# Patient Record
Sex: Male | Born: 1955 | Race: Black or African American | Hispanic: No | Marital: Single | Smoking: Current every day smoker
Health system: Southern US, Community
[De-identification: ages and names within clinical notes are randomized; demographics above are authoritative.]

## PROBLEM LIST (undated history)

## (undated) DIAGNOSIS — M1990 Unspecified osteoarthritis, unspecified site: Secondary | ICD-10-CM

## (undated) DIAGNOSIS — E785 Hyperlipidemia, unspecified: Secondary | ICD-10-CM

## (undated) DIAGNOSIS — Z8619 Personal history of other infectious and parasitic diseases: Secondary | ICD-10-CM

## (undated) DIAGNOSIS — T7840XA Allergy, unspecified, initial encounter: Secondary | ICD-10-CM

## (undated) HISTORY — DX: Unspecified osteoarthritis, unspecified site: M19.90

## (undated) HISTORY — PX: THYROID SURGERY: SHX805

## (undated) HISTORY — PX: NO PAST SURGERIES: SHX2092

## (undated) HISTORY — DX: Allergy, unspecified, initial encounter: T78.40XA

## (undated) HISTORY — DX: Personal history of other infectious and parasitic diseases: Z86.19

## (undated) HISTORY — PX: COLONOSCOPY: SHX174

## (undated) HISTORY — DX: Hyperlipidemia, unspecified: E78.5

---

## 2003-03-14 HISTORY — PX: COLONOSCOPY: SHX174

## 2008-12-15 ENCOUNTER — Ambulatory Visit: Payer: Self-pay | Admitting: Internal Medicine

## 2008-12-15 ENCOUNTER — Ambulatory Visit (HOSPITAL_BASED_OUTPATIENT_CLINIC_OR_DEPARTMENT_OTHER): Admission: RE | Admit: 2008-12-15 | Discharge: 2008-12-15 | Payer: Self-pay | Admitting: Internal Medicine

## 2008-12-15 ENCOUNTER — Ambulatory Visit: Payer: Self-pay | Admitting: Diagnostic Radiology

## 2008-12-15 ENCOUNTER — Telehealth: Payer: Self-pay | Admitting: Internal Medicine

## 2008-12-15 DIAGNOSIS — R221 Localized swelling, mass and lump, neck: Secondary | ICD-10-CM | POA: Insufficient documentation

## 2008-12-15 DIAGNOSIS — J309 Allergic rhinitis, unspecified: Secondary | ICD-10-CM | POA: Insufficient documentation

## 2008-12-15 DIAGNOSIS — A6000 Herpesviral infection of urogenital system, unspecified: Secondary | ICD-10-CM | POA: Insufficient documentation

## 2008-12-15 DIAGNOSIS — E785 Hyperlipidemia, unspecified: Secondary | ICD-10-CM | POA: Insufficient documentation

## 2008-12-15 LAB — CONVERTED CEMR LAB
ALT: 13 units/L (ref 0–53)
AST: 17 units/L (ref 0–37)
Albumin: 4.2 g/dL (ref 3.5–5.2)
Alkaline Phosphatase: 46 units/L (ref 39–117)
BUN: 16 mg/dL (ref 6–23)
Basophils Absolute: 0 10*3/uL (ref 0.0–0.1)
Basophils Relative: 1 % (ref 0–1)
CO2: 24 meq/L (ref 19–32)
Calcium: 9.4 mg/dL (ref 8.4–10.5)
Cholesterol: 230 mg/dL — ABNORMAL HIGH (ref 0–200)
Eosinophils Absolute: 0.1 10*3/uL (ref 0.0–0.7)
Free T4: 1.26 ng/dL (ref 0.80–1.80)
HCT: 41.4 % (ref 39.0–52.0)
Herpes Simplex Vrs I&II-IgM Ab (EIA): 1.01
Indirect Bilirubin: 0.5 mg/dL (ref 0.0–0.9)
MCHC: 32.4 g/dL (ref 30.0–36.0)
MCV: 88.3 fL (ref 78.0–100.0)
Monocytes Relative: 9 % (ref 3–12)
Neutro Abs: 2.1 10*3/uL (ref 1.7–7.7)
Neutrophils Relative %: 42 % — ABNORMAL LOW (ref 43–77)
RBC: 4.69 M/uL (ref 4.22–5.81)
RDW: 14.3 % (ref 11.5–15.5)
Sodium: 140 meq/L (ref 135–145)
WBC: 5.1 10*3/uL (ref 4.0–10.5)

## 2008-12-16 ENCOUNTER — Encounter: Payer: Self-pay | Admitting: Internal Medicine

## 2008-12-16 ENCOUNTER — Other Ambulatory Visit: Admission: RE | Admit: 2008-12-16 | Discharge: 2008-12-16 | Payer: Self-pay | Admitting: Otolaryngology

## 2008-12-17 ENCOUNTER — Telehealth: Payer: Self-pay | Admitting: Internal Medicine

## 2009-01-11 ENCOUNTER — Encounter (INDEPENDENT_AMBULATORY_CARE_PROVIDER_SITE_OTHER): Payer: Self-pay | Admitting: Otolaryngology

## 2009-01-11 ENCOUNTER — Ambulatory Visit (HOSPITAL_COMMUNITY): Admission: RE | Admit: 2009-01-11 | Discharge: 2009-01-12 | Payer: Self-pay | Admitting: Otolaryngology

## 2009-01-14 ENCOUNTER — Ambulatory Visit: Payer: Self-pay | Admitting: Internal Medicine

## 2009-01-14 DIAGNOSIS — E049 Nontoxic goiter, unspecified: Secondary | ICD-10-CM | POA: Insufficient documentation

## 2009-01-14 DIAGNOSIS — N529 Male erectile dysfunction, unspecified: Secondary | ICD-10-CM | POA: Insufficient documentation

## 2009-01-18 ENCOUNTER — Encounter: Payer: Self-pay | Admitting: Internal Medicine

## 2009-02-02 ENCOUNTER — Encounter: Payer: Self-pay | Admitting: Internal Medicine

## 2009-03-10 ENCOUNTER — Ambulatory Visit: Payer: Self-pay | Admitting: Internal Medicine

## 2009-03-10 LAB — CONVERTED CEMR LAB
ALT: 14 units/L (ref 0–53)
AST: 19 units/L (ref 0–37)
Albumin: 4.2 g/dL (ref 3.5–5.2)
BUN: 11 mg/dL (ref 6–23)
Bilirubin, Direct: 0.1 mg/dL (ref 0.0–0.3)
Calcium: 9.6 mg/dL (ref 8.4–10.5)
Cholesterol: 239 mg/dL — ABNORMAL HIGH (ref 0–200)
Free T4: 1.07 ng/dL (ref 0.80–1.80)
Glucose, Bld: 86 mg/dL (ref 70–99)
HDL: 48 mg/dL (ref >39)
Indirect Bilirubin: 0.3 mg/dL (ref 0.0–0.9)
Sodium: 141 meq/L (ref 135–145)
TSH: 2.05 microintl units/mL (ref 0.350–4.500)
Testosterone: 448.91 ng/dL (ref 350–890)
Total CHOL/HDL Ratio: 5
Total Protein: 7 g/dL (ref 6.0–8.3)
Triglycerides: 110 mg/dL (ref <150)

## 2009-03-17 ENCOUNTER — Ambulatory Visit: Payer: Self-pay | Admitting: Internal Medicine

## 2009-05-13 ENCOUNTER — Telehealth: Payer: Self-pay | Admitting: Internal Medicine

## 2009-05-21 ENCOUNTER — Encounter: Payer: Self-pay | Admitting: Internal Medicine

## 2009-05-28 ENCOUNTER — Telehealth: Payer: Self-pay | Admitting: Internal Medicine

## 2009-06-22 ENCOUNTER — Encounter: Payer: Self-pay | Admitting: Internal Medicine

## 2010-01-24 ENCOUNTER — Telehealth: Payer: Self-pay | Admitting: Internal Medicine

## 2010-04-12 NOTE — Progress Notes (Signed)
Summary: Call when papers are ready for pick up  Phone Note Call from Patient   Caller: Patient Summary of Call: Call patient when papers are ready [phone] ( Forms for Medicine Insurance) Initial call taken by: Michaelle Copas,  May 13, 2009 11:02 AM  Follow-up for Phone Call        call placed to patient at [phone], no answer, voice message left for patient to return call regarding patient assistance form. Patient signature is needed so the form coud be copied and faxed from our office Follow-up by: Glendell Docker CMA,  May 21, 2009 3:50 PM  Additional Follow-up for Phone Call Additional follow up Details #1::        Pt. called and stated he wll come by our office to sign forms. Additional Follow-up by: Michaelle Copas,  May 21, 2009 4:31 PM    Additional Follow-up for Phone Call Additional follow up Details #2::    patient has signed off on forms, forms have been mailed to [employer] connect care program, and copy sent to medical records to be scanned in patient chart  Follow-up by: Glendell Docker CMA,  May 24, 2009 2:08 PM  New/Updated Medications: LIPITOR 20 MG TABS (ATORVASTATIN CALCIUM) Take 1 tablet by mouth once a day LIPITOR 20 MG TABS (ATORVASTATIN CALCIUM) Take 1 tablet by mouth once a day Prescriptions: LIPITOR 20 MG TABS (ATORVASTATIN CALCIUM) Take 1 tablet by mouth once a day  #90 x 3   Entered by:   Glendell Docker CMA   Authorized by:   D. Thomos Lemons DO   Signed by:   Glendell Docker CMA on 05/20/2009   Method used:   Printed then faxed to ...       [facility]  [address] [phone][facility] (retail)       [address]       [place], [place]  [address]       Ph: [phone] or [phone]       Fax: [phone]   RxID:   [id number]

## 2010-04-12 NOTE — Medication Information (Signed)
Summary: Approval for Patient Assistance Program/[employer]  Approval for Patient Assistance Program/[employer]   Imported By: Lanelle Bal 07/12/2009 10:22:41  _____________________________________________________________________  External Attachment:    Type:   Image     Comment:   External Document

## 2010-04-12 NOTE — Medication Information (Signed)
Summary: Patient Assistance Form/[employer]  Patient Assistance Form/[employer]   Imported By: Lanelle Bal 05/31/2009 09:41:31  _____________________________________________________________________  External Attachment:    Type:   Image     Comment:   External Document

## 2010-04-12 NOTE — Progress Notes (Signed)
Summary: checking status   Phone Note Call from Patient   Caller: Patient Summary of Call: pt. calling to find out if we heard back from [employer]?? Call patient [phone] within the next 15 minutes so he knows if he can pick up his med or not. Thank you Initial call taken by: Michaelle Copas,  May 28, 2009 2:42 PM  Follow-up for Phone Call        call returned to patient at [phone] , no answer, detailed voice message left informing patient paperwork has been submitted awaiting approval/denial from [employer] Follow-up by: Glendell Docker CMA,  May 28, 2009 3:22 PM

## 2010-04-12 NOTE — Assessment & Plan Note (Signed)
Summary: 2 mointh follow up/mhf   Vital Signs:  Patient profile:   55 year old male Weight:      198.75 pounds BMI:     27.05 O2 Sat:      99 % on Room air Temp:     98.2 degrees F oral Pulse rate:   70 / minute Pulse rhythm:   irregular Resp:     18 per minute BP sitting:   100 / 70  (right arm) Cuff size:   large  Vitals Entered By: Glendell Docker CMA (March 17, 2009 8:58 AM)  O2 Flow:  Room air  Primary Care Provider:  D. Thomos Lemons DO  CC:  2 Month Follow up .  History of Present Illness: 2 Month Follow up   Hyperlipidemia Follow-Up      This is a 55 year old man who presents for Hyperlipidemia follow-up.  The patient denies muscle aches and GI upset.  The patient denies the following symptoms: chest pain/pressure.  Compliance with medications (by patient report) has been intermittent and poor.  Dietary compliance has been fair.    ED - improved  Preventive Screening-Counseling & Management  Alcohol-Tobacco     Smoking Status: never  Allergies (verified): No Known Drug Allergies  Past History:  Past Medical History: chicken pox- childhood Allergic rhinitis Hyperlipidemia    Family History: Family History of CAD Male 1st degree relative <50    Social History: Occupation: Work in Geneticist, molecular Single 1 daughter 24 1 son 89   Alcohol use-yes (6-12 beers per week) Never Smoked  Physical Exam  General:  alert, well-developed, and well-nourished.   Neck:  neck incision healed Lungs:  normal respiratory effort and normal breath sounds.   Heart:  normal rate, regular rhythm, and no gallop.   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 Horald Pollen.  benign follicular nodule.  currently euthyroid - monitor  Problem # 2:  HYPERLIPIDEMIA (ICD-272.4) LDL not at goal.  I urged compliance.    His updated medication list for this problem includes:    Lipitor 20 Mg Tabs  (Atorvastatin calcium) ..... One by mouth once daily  Labs Reviewed: SGOT: 19 (03/10/2009)   SGPT: 14 (03/10/2009)   HDL:48 (03/10/2009), 44 (12/15/2008)  LDL:169 (03/10/2009), 172 (12/15/2008)  Chol:239 (03/10/2009), 230 (12/15/2008)  Trig:110 (03/10/2009), 72 (12/15/2008)  Complete Medication List: 1)  Lipitor 20 Mg Tabs (Atorvastatin calcium) .... One by mouth once daily  Other Orders: Tdap => 22yrs IM ([address]) Admin 1st Vaccine ([address])  Patient Instructions: 1)  Please schedule a follow-up appointment in 1 year. 2)  BMP prior to visit, ICD-9: 401.9 3)  Hepatic Panel prior to visit, ICD-9: 272.4 4)  Lipid Panel prior to visit, ICD-9: 272.4 5)  TSH prior to visit, ICD-9: 272.4 6)  Please return for lab work one (1) week before your next appointment.  Prescriptions: LIPITOR 20 MG TABS (ATORVASTATIN CALCIUM) one by mouth once daily  #90 x 3   Entered and Authorized by:   D. Thomos Lemons DO   Signed by:   D. Thomos Lemons DO on 03/17/2009   Method used:   Electronically to        [facility]* (mail-order)             ,          Ph: [phone]       Fax:  [phone]   RxID:   [id number]   Current Allergies (reviewed today): No known allergies    Immunizations Administered:  Tetanus Vaccine:    Vaccine Type: Tdap    Site: left deltoid    Mfr: GlaxoSmithKline    Dose: 0.5 ml    Route: IM    Given by: Glendell Docker CMA    Exp. Date: 09/26/2010    Lot #: [id number]    VIS given: 01/29/07 version given March 17, 2009.

## 2010-04-12 NOTE — Progress Notes (Signed)
Summary: Lipitor refill  Phone Note Refill Request Message from:  Patient on January 24, 2010 1:32 PM  Refills Requested: Medication #1:  LIPITOR 20 MG TABS Take 1 tablet by mouth once a day.   Dosage confirmed as above?Dosage Confirmed   Brand Name Necessary? No   Supply Requested: 3 months   Last Refilled: 05/20/2009 He gets these from [employer].  He is out  The last time he got them he picked them up from our office    Method Requested: Electronic Next Appointment Scheduled: none Initial call taken by: Lannette Donath,  January 24, 2010 1:32 PM  Follow-up for Phone Call        Calll placed to [employer] at [phone] spoke with CSR [place], refill requested for Lipitor. Order number [id number], she states the order should arrive within 7-10 business days. Follow-up by: Glendell Docker CMA,  January 25, 2010 9:00 AM     Appended Document: Lipitor refill Pt called to check on status of order. Pt advised per note.

## 2010-05-06 ENCOUNTER — Telehealth: Payer: Self-pay | Admitting: Internal Medicine

## 2010-05-13 ENCOUNTER — Telehealth: Payer: Self-pay | Admitting: Internal Medicine

## 2010-05-18 ENCOUNTER — Encounter: Payer: Self-pay | Admitting: Internal Medicine

## 2010-05-19 NOTE — Progress Notes (Signed)
Summary: [employer]  pt assistent for Lipitor  Phone Note Call from Patient   Caller: Patient Details for Reason: Pt assistent program Summary of Call: Pt called- needs his Lipitor refill from [employer]  pt assistent program Initial call taken by: Darral Dash,  May 06, 2010 4:56 PM  Follow-up for Phone Call        call placed to  [employer] at [phone], Patient ID [id number],  Order for Lipitor placed. Order number [id number]. Recording stating rx should be available in 7-10 days.   call placed to patient at [phone], voice recording reached stating the telephone number is not in service.  Paitient is due for office visit. Follow-up by: Glendell Docker CMA,  May 09, 2010 4:34 PM    New/Updated Medications: LIPITOR 20 MG TABS (ATORVASTATIN CALCIUM) Take 1 tablet by mouth once a day Prescriptions: LIPITOR 20 MG TABS (ATORVASTATIN CALCIUM) Take 1 tablet by mouth once a day  #90 x 0   Entered by:   Glendell Docker CMA   Authorized by:   D. Thomos Lemons DO   Signed by:   Glendell Docker CMA on 05/09/2010   Method used:   Samples Given   RxID:   [id number]

## 2010-05-24 NOTE — Letter (Signed)
Summary: Generic Letter   at [facility]  [address]   [place], [place] [address]   Phone: [phone]  Fax: [phone]       05/18/2010     Tyrone Washington [address] [place], [place]  [address]     Dear Mr. GLASCOE,  There have been several attempts to contact you, with no success. This letter is to inform you of the patient assistance medication from [employer] for Lipitor is available at the office for pick up.   When pick in up this medication please update your contact information.         Sincerely,   Glendell Docker CMA Dr. Thomos Lemons

## 2010-05-24 NOTE — Progress Notes (Signed)
Summary: [employer]-Lipitor  Phone Note Outgoing Call   Call placed by: Glendell Docker CMA,  May 13, 2010 11:24 AM Call placed to: Patient Summary of Call: call placed to patient at [phone] to inform Lipitor from Patient assistance was available. Voice recording stating "the number that you have reached hs been temporarily disconnected. If you feel you have reached this recording in error please try your call again" Initial call taken by: Glendell Docker CMA,  May 13, 2010 11:24 AM  Follow-up for Phone Call        attempted to contact patient at [phone], recording reached stating the phone number is not in service, call placed to [phone], recording reached stating the number is not in service.   Follow-up by: Glendell Docker CMA,  May 18, 2010 2:59 PM  Additional Follow-up for Phone Call Additional follow up Details #1::        Letter mailed to patient Additional Follow-up by: Glendell Docker CMA,  May 18, 2010 3:08 PM

## 2010-05-27 ENCOUNTER — Encounter: Payer: Self-pay | Admitting: Internal Medicine

## 2010-05-27 ENCOUNTER — Encounter (INDEPENDENT_AMBULATORY_CARE_PROVIDER_SITE_OTHER): Payer: BC Managed Care – HMO | Admitting: Internal Medicine

## 2010-05-27 ENCOUNTER — Ambulatory Visit (HOSPITAL_BASED_OUTPATIENT_CLINIC_OR_DEPARTMENT_OTHER)
Admission: RE | Admit: 2010-05-27 | Discharge: 2010-05-27 | Disposition: A | Discharge status: Home / Self Care | Payer: BC Managed Care – HMO | Source: Ambulatory Visit | Attending: Internal Medicine | Admitting: Internal Medicine

## 2010-05-27 ENCOUNTER — Other Ambulatory Visit: Payer: Self-pay | Admitting: Internal Medicine

## 2010-05-27 DIAGNOSIS — R634 Abnormal weight loss: Secondary | ICD-10-CM

## 2010-05-27 DIAGNOSIS — Z0000 Encounter for general adult medical examination without abnormal findings: Secondary | ICD-10-CM

## 2010-05-27 DIAGNOSIS — R0602 Shortness of breath: Secondary | ICD-10-CM | POA: Insufficient documentation

## 2010-05-27 DIAGNOSIS — J309 Allergic rhinitis, unspecified: Secondary | ICD-10-CM

## 2010-05-29 ENCOUNTER — Encounter: Payer: Self-pay | Admitting: Internal Medicine

## 2010-05-30 LAB — CONVERTED CEMR LAB
ALT: 22 units/L (ref 0–53)
AST: 22 units/L (ref 0–37)
Albumin: 4.2 g/dL (ref 3.5–5.2)
BUN: 17 mg/dL (ref 6–23)
Basophils Absolute: 0.1 10*3/uL (ref 0.0–0.1)
CO2: 26 meq/L (ref 19–32)
Chloride: 104 meq/L (ref 96–112)
Creatinine, Ser: 1 mg/dL (ref 0.40–1.50)
Eosinophils Relative: 4 % (ref 0–5)
HCT: 41.4 % (ref 39.0–52.0)
HDL: 53 mg/dL (ref >39)
Hemoglobin: 13.6 g/dL (ref 13.0–17.0)
LDL Cholesterol: 107 mg/dL — ABNORMAL HIGH (ref 0–99)
MCHC: 32.9 g/dL (ref 30.0–36.0)
Monocytes Relative: 7 % (ref 3–12)
Neutro Abs: 1.6 10*3/uL — ABNORMAL LOW (ref 1.7–7.7)
Potassium: 5.1 meq/L (ref 3.5–5.3)
Total CHOL/HDL Ratio: 3.2
Total Protein: 7 g/dL (ref 6.0–8.3)
WBC: 3.8 10*3/uL — ABNORMAL LOW (ref 4.0–10.5)

## 2010-06-09 NOTE — Letter (Signed)
   [place] at [facility] [address] [place], [place]  [address]  [place] Phone: [phone]      May 29, 2010   [name] [address] [place], [place] [address]  RE:  LAB RESULTS  Dear  Mr. HAUSMANN,  The following is an interpretation of your most recent lab tests.  Please take note of any instructions provided or changes to medications that have resulted from your lab work.  ELECTROLYTES:  Good - no changes needed  KIDNEY FUNCTION TESTS:  Good - no changes needed  LIVER FUNCTION TESTS:  Good - no changes needed  LIPID PANEL:  Fair - review at your next visit Triglyceride: 61   Cholesterol: 172   LDL: 107   HDL: 53   Chol/HDL%:  3.2 Ratio  THYROID STUDIES:  Thyroid studies normal TSH: 2.811     CBC:  Stable - no changes needed       Sincerely Yours,    Dr. Thomos Lemons  Appended Document:  mailed

## 2010-06-14 NOTE — Assessment & Plan Note (Signed)
Summary: cpe/pt fasting/ss   Vital Signs:  Patient profile:   55 year old male Height:      72 inches Weight:      165 pounds BMI:     22.46 O2 Sat:      99 % on Room air Temp:     98.4 degrees F oral Pulse rate:   76 / minute Resp:     18 per minute BP sitting:   90 / 60  (right arm) Cuff size:   regular  Vitals Entered By: Glendell Docker CMA (May 27, 2010 9:43 AM)  O2 Flow:  Room air CC: CPX Is Patient Diabetic? No Pain Assessment Patient in pain? no      Comments fasting for labs,   Primary Care Provider:  Dondra Spry DO  CC:  CPX.  History of Present Illness: 55 y/o male for routine cpx lost 35 lbs over last 1 yr he denies appetite change he notes he is eating less job involves regular physical activity  feels healthy  denies chronic cough no fevers  started in Nov and Dec right ear - popping sensation taking sinus medication - taking otc pseudofed using salt water nose spray  Preventive Screening-Counseling & Management  Alcohol-Tobacco     Alcohol drinks/day: <1     Smoking Status: never  Caffeine-Diet-Exercise     Caffeine use/day: 4 cups coffe daily     Does Patient Exercise: no  Allergies (verified): No Known Drug Allergies  Past History:  Past Medical History: chicken pox- childhood Allergic rhinitis Hyperlipidemia     Family History: Family History of CAD Male 1st degree relative <50     Social History: Occupation: Work in Geneticist, 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  The patient denies anorexia, fever, chest pain, syncope, dyspnea on exertion, prolonged cough, headaches, abdominal pain, melena, hematochezia, severe indigestion/heartburn, depression, and testicular masses.    Physical Exam  General:  alert, well-developed, and well-nourished.   Head:   normocephalic and atraumatic.   Eyes:  vision grossly intact, pupils equal, pupils round, and pupils reactive to light.  bilateral arcus senilis Ears:  R ear normal and L ear normal.   Mouth:  Oral mucosa and oropharynx without lesions or exudates.   Neck:  supple, no masses, and no carotid bruits.   Lungs:  normal respiratory effort and normal breath sounds.   Heart:  normal rate, regular rhythm, and no gallop.   Abdomen:  soft, non-tender, no distention, no hepatomegaly, and no splenomegaly.   Genitalia:  uncircumcised, no scrotal masses, and no urethral discharge.   Neurologic:  cranial nerves II-XII intact and gait normal.     Impression & Recommendations:  Problem # 1:  PREVENTIVE HEALTH CARE (ICD-V70.0) Reviewed adult health maintenance protocols.  Colonoscopy: normal (09/04/2007) Td Booster: Tdap (03/17/2009)   Flu Vax: Fluvax Non-MCR (01/11/2009)   Chol: 239 (03/10/2009)   HDL: 48 (03/10/2009)   LDL: 169 (03/10/2009)   TG: 110 (03/10/2009) TSH: 2.050 (03/10/2009)     Problem # 2:  ALLERGIC RHINITIS (ICD-477.9) Assessment: Deteriorated  His updated medication list for this problem includes:    Fluticasone Propionate 50 Mcg/act Susp (Fluticasone propionate) .Marland Kitchen... 2 sprays each nostril qd  Problem # 3:  GENITAL HERPES (ICD-054.10) Assessment: Unchanged  Problem #  4:  WEIGHT LOSS (ICD-783.21)  Orders: CXR- 2view (CXR) T-Basic Metabolic Panel ([address]) T-Hepatic Function [phone]) T-CBC w/Diff [phone]) T-HIV Antibody  (Reflex) [phone]) T-TSH [phone]) T-T4, Free [phone])  Complete Medication List: 1)  Lipitor 20 Mg Tabs (Atorvastatin calcium) .... Take 1 tablet by mouth once a day 2)  Multivitamins Tabs (Multiple vitamin) .... Take 1 tablet by mouth once a day 3)  Fluticasone Propionate 50 Mcg/act Susp (Fluticasone propionate) .... 2 sprays each nostril qd 4)  Valacyclovir Hcl 500 Mg Tabs (Valacyclovir hcl) .... One tab by mouth once  daily  Other Orders: EKG w/ Interpretation ([address]) T-Lipid Profile ([address])  Patient Instructions: 1)  Please schedule a follow-up appointment in 2 months. Prescriptions: LIPITOR 20 MG TABS (ATORVASTATIN CALCIUM) Take 1 tablet by mouth once a day  #90 x 1   Entered and Authorized by:   D. Thomos Lemons DO   Signed by:   D. Thomos Lemons DO on 05/27/2010   Method used:   [facility]        [facility]  [address] [phone]* (retail)       [address]       [place], [place]  [address]       Ph: [phone] or [phone]       Fax: [phone]   RxID:   [id number] VALACYCLOVIR HCL 500 MG TABS (VALACYCLOVIR HCL) one tab by mouth once daily  #90 x 1   Entered and Authorized by:   D. Thomos Lemons DO   Signed by:   D. Thomos Lemons DO on 05/27/2010   Method used:   [facility]        [facility]  [address] [phone]* (retail)       [address]       [place], [place]  [address]       Ph: [phone] or [phone]       Fax: [phone]   RxID:   [id number] FLUTICASONE PROPIONATE 50 MCG/ACT SUSP (FLUTICASONE PROPIONATE) 2 sprays each nostril qd  #1 x 3   Entered and Authorized by:   D. Thomos Lemons DO   Signed by:   D. Thomos Lemons DO on 05/27/2010   Method used:   [facility]        [facility]  [address] [phone]* (retail)       [address]       [place], [place]  [address]       Ph: [phone] or [phone]       Fax: [phone]   RxID:   [id number]    Orders Added: 1)  EKG w/ Interpretation [[address]] 2)  CXR- 2view [CXR] 3)  T-Basic Metabolic Panel [80048-22910] 4)  T-Hepatic Function [80076-22960] 5)  T-CBC w/Diff [34193-79024] 6)  T-Lipid Profile [80061-22930] 7)  T-HIV Antibody  (Reflex) [[id number]] 8)  T-TSH [42683-41962] 9)  T-T4, Free [22979-89211] 10)  Est. Patient 40-64 years [[address]] 11)  Est. Patient Level III [[address]]    Current Allergies (reviewed today): No known  allergies

## 2010-06-16 LAB — CBC
HCT: 41 % (ref 39.0–52.0)
Platelets: 283 10*3/uL (ref 150–400)
RBC: 4.52 MIL/uL (ref 4.22–5.81)
RDW: 14.9 % (ref 11.5–15.5)
WBC: 4 10*3/uL (ref 4.0–10.5)

## 2010-06-16 LAB — BASIC METABOLIC PANEL
BUN: 11 mg/dL (ref 6–23)
CO2: 29 mEq/L (ref 19–32)
Chloride: 109 mEq/L (ref 96–112)
GFR calc Af Amer: >60 mL/min (ref >60)
Potassium: 4 mEq/L (ref 3.5–5.1)

## 2010-08-25 ENCOUNTER — Encounter: Payer: Self-pay | Admitting: Family

## 2010-08-26 ENCOUNTER — Ambulatory Visit: Payer: BC Managed Care – HMO | Admitting: Family

## 2010-08-29 ENCOUNTER — Encounter: Payer: Self-pay | Admitting: Family

## 2010-08-29 ENCOUNTER — Ambulatory Visit: Payer: BC Managed Care – HMO | Admitting: Family

## 2010-08-29 ENCOUNTER — Ambulatory Visit (HOSPITAL_BASED_OUTPATIENT_CLINIC_OR_DEPARTMENT_OTHER)
Admission: RE | Admit: 2010-08-29 | Discharge: 2010-08-29 | Disposition: A | Discharge status: Home / Self Care | Payer: BC Managed Care – HMO | Source: Ambulatory Visit | Attending: Family | Admitting: Family

## 2010-08-29 ENCOUNTER — Ambulatory Visit (INDEPENDENT_AMBULATORY_CARE_PROVIDER_SITE_OTHER): Payer: BC Managed Care – HMO | Admitting: Family

## 2010-08-29 VITALS — BP 100/78 | HR 84 | Temp 98.7°F | Resp 16 | Wt 175.0 lb

## 2010-08-29 DIAGNOSIS — R221 Localized swelling, mass and lump, neck: Secondary | ICD-10-CM

## 2010-08-29 DIAGNOSIS — R220 Localized swelling, mass and lump, head: Secondary | ICD-10-CM | POA: Insufficient documentation

## 2010-08-29 LAB — TSH: TSH: 1.585 u[IU]/mL (ref 0.350–4.500)

## 2010-08-29 NOTE — Patient Instructions (Signed)
Please complete your ultrasound and blood work on the first floor today.  Follow up in 1 month.

## 2010-08-29 NOTE — Assessment & Plan Note (Signed)
Will check TSH and order ultrasound of neck to further characterize. Based on neck results will consider referral back to Dr. Jenne Pane for further eval/biopsy.  Pt to f/u here in 1 month.

## 2010-08-29 NOTE — Progress Notes (Signed)
  Subjective:    Patient ID: Tyrone Washington, male    DOB: [birth year], 55 y.o.   MRN: [id number]  HPI  Tyrone Washington is a 55 yr old male who presents today with chief complaint of nodule on his neck.  Notes that he had a partial thyroidectomy 1 year ago (Dr. Jenne Pane).  Path showed microfollicular adenoma. He also had a consult with Dr. Talmage Nap at that time.    It was recommended that he have  A follow up TSH.  No synthroid was recommended at that time as he was euthyroid. 1 month  ago he developed a tender nodule on his left anterior neck.   Denies sore throat, ear pain, or fever.    Review of Systems See HPI  Past Medical History  Diagnosis Date  . History of chicken pox   . Allergy     allergic rhinitis  . Hyperlipidemia     History   Social History  . Marital Status: Single    Spouse Name: N/A    Number of Children: N/A  . Years of Education: N/A   Occupational History  . Not on file.   Social History Main Topics  . Smoking status: Never Smoker   . Smokeless tobacco: Not on file  . Alcohol Use: 3.6 - 7.2 oz/week    6-12 Cans of beer per week  . Drug Use: Not on file  . Sexually Active: Not on file   Other Topics Concern  . Not on file   Social History Narrative  . No narrative on file    No past surgical history on file.  Family History  Problem Relation Age of Onset  . Heart disease Other     No Known Allergies  Current Outpatient Prescriptions on File Prior to Visit  Medication Sig Dispense Refill  . atorvastatin (LIPITOR) 20 MG tablet Take 20 mg by mouth daily.        . fluticasone (FLONASE) 50 MCG/ACT nasal spray Place 2 sprays into both nostrils daily.        . Multiple Vitamin (MULTIVITAMIN) tablet Take 1 tablet by mouth daily.        . valACYclovir (VALTREX) 500 MG tablet Take 500 mg by mouth daily.          BP 100/78  Pulse 84  Temp(Src) 98.7 F (37.1 C) (Oral)  Resp 16  Wt 175 lb (79.379 kg)  SpO2 99%       Objective:   Physical Exam    Constitutional: He appears well-developed and well-nourished.  Neck:         Nodule noted at base of anterior neck (left) minimally mobile, hard, size of a large pea.  No other cervical LAD is noted.   Cardiovascular: Normal heart sounds.   Pulmonary/Chest: Effort normal and breath sounds normal.  Skin: Skin is warm and dry.          Assessment & Plan:

## 2010-08-30 ENCOUNTER — Telehealth: Payer: Self-pay | Admitting: Family

## 2010-08-30 DIAGNOSIS — R221 Localized swelling, mass and lump, neck: Secondary | ICD-10-CM

## 2010-08-30 NOTE — Telephone Encounter (Signed)
Left message for patient to return call.  When he calls back pls inform him that the ultrasound did not give [place] much more information about the nodule in his neck.  They recommended that we have a tissue biopsy.  I will refer back to ENT Dr. Jenne Pane for biopsy.

## 2010-08-30 NOTE — Telephone Encounter (Signed)
Pt returned call, explained [place] results and plans to refer to ENT for biopsy.

## 2010-09-19 ENCOUNTER — Telehealth: Payer: Self-pay | Admitting: Family

## 2010-09-19 NOTE — Telephone Encounter (Signed)
Reviewed FNA results from neck performed by [facility].  Noted thyroid follicular cells.  Follicular neoplasm cannot be excluded. Left message for patient to return my call on cell.

## 2010-09-20 NOTE — Telephone Encounter (Signed)
Called pt's cell- no answer- left additional message requesting call back.

## 2010-09-21 NOTE — Telephone Encounter (Signed)
Patient returned my call.  He tells me that Dr. Christell Constant reviewed the results of his biopsy and plans no further work up.   Nicki Guadalajara- could you please request records from Dr. Christell Constant- ENT? Thanks.

## 2010-09-21 NOTE — Telephone Encounter (Signed)
Spoke with [name] at [phone] and requested records.

## 2010-09-22 NOTE — Telephone Encounter (Signed)
Requested records received and forwarded to Provider for review.

## 2010-09-28 ENCOUNTER — Telehealth: Payer: Self-pay | Admitting: Family

## 2010-09-28 NOTE — Telephone Encounter (Signed)
Reviewed FNA results from ENT of neck biopsy- "follicular neoplasm cannot be excluded." Left message for Dr. Christell Constant to call me to review these results.

## 2010-10-03 NOTE — Telephone Encounter (Signed)
Dr. Christell Constant returned my call on 7/18.  He reviewed results of biopsy with me and told me that he feels these results indicated residual thyroid tissue- No further work up.

## 2011-07-07 ENCOUNTER — Encounter: Payer: Self-pay | Admitting: Family

## 2011-07-07 ENCOUNTER — Ambulatory Visit (INDEPENDENT_AMBULATORY_CARE_PROVIDER_SITE_OTHER): Payer: BC Managed Care – HMO | Admitting: Family

## 2011-07-07 VITALS — BP 112/86 | HR 71 | Temp 98.0°F | Resp 16 | Wt 165.1 lb

## 2011-07-07 DIAGNOSIS — A6000 Herpesviral infection of urogenital system, unspecified: Secondary | ICD-10-CM

## 2011-07-07 DIAGNOSIS — R221 Localized swelling, mass and lump, neck: Secondary | ICD-10-CM

## 2011-07-07 DIAGNOSIS — E785 Hyperlipidemia, unspecified: Secondary | ICD-10-CM

## 2011-07-07 DIAGNOSIS — R220 Localized swelling, mass and lump, head: Secondary | ICD-10-CM

## 2011-07-07 LAB — HEPATIC FUNCTION PANEL
AST: 19 U/L (ref 0–37)
Alkaline Phosphatase: 54 U/L (ref 39–117)
Bilirubin, Direct: 0.1 mg/dL (ref 0.0–0.3)
Indirect Bilirubin: 0.4 mg/dL (ref 0.0–0.9)
Total Bilirubin: 0.5 mg/dL (ref 0.3–1.2)

## 2011-07-07 LAB — LIPID PANEL
Cholesterol: 216 mg/dL — ABNORMAL HIGH (ref 0–200)
Total CHOL/HDL Ratio: 3.7 Ratio
VLDL: 8 mg/dL (ref 0–40)

## 2011-07-07 MED ORDER — ATORVASTATIN CALCIUM 20 MG PO TABS: 20.0000 mg | ORAL_TABLET | Freq: Every day | ORAL | Status: DC

## 2011-07-07 MED ORDER — VALACYCLOVIR HCL 500 MG PO TABS: 500.0000 mg | ORAL_TABLET | Freq: Every day | ORAL | Status: DC

## 2011-07-07 NOTE — Assessment & Plan Note (Signed)
Frequent outbreaks.  Resume valtrex.

## 2011-07-07 NOTE — Assessment & Plan Note (Signed)
Appears to be significantly larger than last visit.  Will refer back to ENT (Dr. Christell Constant).

## 2011-07-07 NOTE — Patient Instructions (Addendum)
Your will be contacted about your referral back to ENT.  Please complete your lab work prior to leaving.  Follow up in 1 month for a complete physical.  Come fasting to this appointment.

## 2011-07-07 NOTE — Assessment & Plan Note (Signed)
Resume lipitor, obtain flp/hepatic function.

## 2011-07-07 NOTE — Progress Notes (Signed)
  Subjective:    Patient ID: Tyrone Washington, male    DOB: [birth year], 56 y.o.   MRN: [id number]  HPI  [name] is a 56 yr old male who presents today to discuss an area of concern on his chest.  He notes an area that protrudes slightly in the middle of his chest. Mild associated tenderness.  First noticed it a week ago.  Neck mass- last year he had evaluation by ENT of the mass in the left lower anterior neck.  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   He underwent ultrasound of the neck last June which noted solid superficial nodule measuring 1.4 cm long X 0.7 cm AP X 0.8 cm.  Biopsy was recommended.  He was evaluated by ENT (Dr. Christell Constant) and ultrasound showed follicular cells which were felt to be residual thyroid tissue.    Hyperlipidemia- notes that he has not been taking lipitor. Wants to check cholesterol today. Plans to restart.  HSV- notes last few months he has had one outbreak a month.  Has not been taking suppressive valtrex.  Review of Systems See HPI  Past Medical History  Diagnosis Date  . History of chicken pox   . Allergy     allergic rhinitis  . Hyperlipidemia     History   Social History  . Marital Status: Single    Spouse Name: N/A    Number of Children: 2  . Years of Education: N/A   Occupational History  .     Social History Main Topics  . Smoking status: Never Smoker   . Smokeless tobacco: Never Used  . Alcohol Use: 3.6 - 7.2 oz/week    6-12 Cans of beer per week  . Drug Use: Not on file  . Sexually Active: Not on file   Other Topics Concern  . Not on file   Social History Narrative  . No narrative on file    No past surgical history on file.  Family History  Problem Relation Age of Onset  . Heart disease Other     No Known Allergies  Current Outpatient Prescriptions on File Prior to Visit  Medication Sig Dispense Refill  . fluticasone (FLONASE) 50 MCG/ACT nasal spray Place 2 sprays into  both nostrils daily.        . Multiple Vitamin (MULTIVITAMIN) tablet Take 1 tablet by mouth daily.        Marland Kitchen DISCONTD: atorvastatin (LIPITOR) 20 MG tablet Take 20 mg by mouth daily.          BP 112/86  Pulse 71  Temp(Src) 98 F (36.7 C) (Oral)  Resp 16  Wt 165 lb 1.9 oz (74.898 kg)  SpO2 97%       Objective:   Physical Exam  Constitutional: He appears well-developed and well-nourished. No distress.  Neck:       Firm mobile mass left lower anterior neck which is approx 1 inch in diameter  Cardiovascular: Normal rate and regular rhythm.   No murmur heard. Pulmonary/Chest: Effort normal and breath sounds normal. No respiratory distress. He has no wheezes. He has no rales. He exhibits no tenderness.  Musculoskeletal:       Area of concern corresponds with base of sternum.  No tenderness, swelling or palpable mass is noted.           Assessment & Plan:

## 2011-07-10 ENCOUNTER — Encounter: Payer: Self-pay | Admitting: Family

## 2011-08-09 ENCOUNTER — Encounter: Payer: BC Managed Care – HMO | Admitting: Family

## 2011-10-05 ENCOUNTER — Ambulatory Visit (HOSPITAL_BASED_OUTPATIENT_CLINIC_OR_DEPARTMENT_OTHER)
Admission: RE | Admit: 2011-10-05 | Discharge: 2011-10-05 | Disposition: A | Discharge status: Home / Self Care | Payer: BC Managed Care – HMO | Source: Ambulatory Visit | Attending: Family | Admitting: Family

## 2011-10-05 ENCOUNTER — Encounter: Payer: Self-pay | Admitting: Family

## 2011-10-05 ENCOUNTER — Ambulatory Visit (INDEPENDENT_AMBULATORY_CARE_PROVIDER_SITE_OTHER): Payer: BC Managed Care – HMO | Admitting: Family

## 2011-10-05 VITALS — BP 108/82 | HR 79 | Temp 98.1°F | Resp 14 | Wt 175.1 lb

## 2011-10-05 DIAGNOSIS — R42 Dizziness and giddiness: Secondary | ICD-10-CM

## 2011-10-05 MED ORDER — AMOXICILLIN-POT CLAVULANATE 875-125 MG PO TABS: 1.0000 | ORAL_TABLET | Freq: Two times a day (BID) | ORAL | Status: AC

## 2011-10-05 NOTE — Progress Notes (Signed)
  Subjective:    Patient ID: Tyrone Washington, male    DOB: 1955-10-13, 56 y.o.   MRN: [id number]  HPI  Reports several episodes of dizziness throughout the week.  Reports that he felt dizzy, like he was going to faint (around 9pm) , tried to get up felt off balance.  Room felt like it was spinning.  Reports that he has some sinus congestion.  He denies fever, nausea/vomitting.  Today he reports that he lost his balance again today.  No numbness, no weakness.  Denies ear pain.    Review of Systems See HPI  Past Medical History  Diagnosis Date  . History of chicken pox   . Allergy     allergic rhinitis  . Hyperlipidemia     History   Social History  . Marital Status: Single    Spouse Name: N/A    Number of Children: 2  . Years of Education: N/A   Occupational History  .     Social History Main Topics  . Smoking status: Never Smoker   . Smokeless tobacco: Never Used  . Alcohol Use: 3.6 - 7.2 oz/week    6-12 Cans of beer per week  . Drug Use: Not on file  . Sexually Active: Not on file   Other Topics Concern  . Not on file   Social History Narrative  . No narrative on file    No past surgical history on file.  Family History  Problem Relation Age of Onset  . Heart disease Other     No Known Allergies  Current Outpatient Prescriptions on File Prior to Visit  Medication Sig Dispense Refill  . atorvastatin (LIPITOR) 20 MG tablet Take 1 tablet (20 mg total) by mouth daily.  30 tablet  5  . fluticasone (FLONASE) 50 MCG/ACT nasal spray Place 2 sprays into both nostrils daily.        . Multiple Vitamin (MULTIVITAMIN) tablet Take 1 tablet by mouth daily.        . valACYclovir (VALTREX) 500 MG tablet Take 1 tablet (500 mg total) by mouth daily.  30 tablet  5    BP 108/82  Pulse 79  Temp 98.1 F (36.7 C) (Oral)  Resp 14  Wt 175 lb 1.9 oz (79.434 kg)  SpO2 99%       Objective:   Physical Exam  Constitutional: He is oriented to person, place, and time. He  appears well-developed and well-nourished. No distress.  HENT:  Head: Normocephalic and atraumatic.  Right Ear: Tympanic membrane and ear canal normal.  Left Ear: Tympanic membrane and ear canal normal.  Mouth/Throat: No oropharyngeal exudate, posterior oropharyngeal edema or posterior oropharyngeal erythema.  Eyes: EOM are normal.  Cardiovascular: Normal rate and regular rhythm.   No murmur heard. Pulmonary/Chest: Effort normal and breath sounds normal. No respiratory distress. He has no wheezes. He has no rales. He exhibits no tenderness.  Neurological: He is alert and oriented to person, place, and time. He has normal strength.  Psychiatric: He has a normal mood and affect. His behavior is normal. Judgment and thought content normal.          Assessment & Plan:

## 2011-10-05 NOTE — Patient Instructions (Addendum)
Please complete your CT on the first floor- we will contact you with your results. Call if symptoms worsen or if symptoms do not improve.

## 2011-10-06 ENCOUNTER — Telehealth: Payer: Self-pay | Admitting: *Deleted

## 2011-10-06 ENCOUNTER — Encounter: Payer: Self-pay | Admitting: *Deleted

## 2011-10-06 MED ORDER — MECLIZINE HCL 25 MG PO TABS: 25.0000 mg | ORAL_TABLET | Freq: Three times a day (TID) | ORAL | Status: AC | PRN

## 2011-10-06 NOTE — Telephone Encounter (Signed)
Per verbal from Provider, ok to take Meclizine 25mg  every 8 hours as needed for dizziness, #30 x no refills. Notified pt and sent Rx to [facility]. Advised pt that medication may cause drowsiness and to use caution while taking it.  Pt voices concern about possibility of developing diabetes. Has read articles that lipitor can lead to diabetes. Pt concerned that his current symptoms could be related to diabetes. Pt denies persistent increased thirst / urination or blurred vision. States he has had episodes of increased thirst and urination in the past. Pt does report relief of headache after using flonase and claritin. Pt will continue antibiotic. He reports that he has not had any bad episodes of dizziness today. Advised pt to be evalutated in the ER over the weekend if symptoms become severe. Pt is aware that we will contact him Monday re: diabetes concerns.  Please advise.

## 2011-10-06 NOTE — Telephone Encounter (Signed)
Notified pt of negative CT. He reports that he continues to have dizziness with standing and wants to know what he should do?  Please advise.

## 2011-10-06 NOTE — Telephone Encounter (Signed)
Encounter opened in error

## 2011-10-08 NOTE — Telephone Encounter (Signed)
Please let patient know that his sugar was normal in March.  If he would like he can return to lab for a fasting glucose- diagnosis dizziness.

## 2011-10-09 NOTE — Telephone Encounter (Signed)
Attempted to reach pt and left detailed message to call and let me know if he wants to proceed with fasting glucose blood test.

## 2011-10-11 DIAGNOSIS — R42 Dizziness and giddiness: Secondary | ICD-10-CM | POA: Insufficient documentation

## 2011-10-11 NOTE — Assessment & Plan Note (Signed)
Plan empiric rx with augmentin for sinusitis.

## 2012-10-25 ENCOUNTER — Other Ambulatory Visit: Payer: Self-pay | Admitting: Family

## 2012-10-25 NOTE — Telephone Encounter (Signed)
Pt last seen 10/06/11 and has no appts on file. Pt requesting refills of liipitor and valtrex.  Please advise.

## 2012-10-25 NOTE — Telephone Encounter (Signed)
Patient last seen for regular OV 04.26.13, due back in 74-month for CPE; seen 07.23.2013 for Acute, pt needs OV as Rx[s] have not been filled since 04.26.13/SLS

## 2012-10-25 NOTE — Telephone Encounter (Signed)
OK to give 2 week supply of lipitor and valtrex.  Needs ov prior to additional refills.

## 2012-10-26 ENCOUNTER — Other Ambulatory Visit: Payer: Self-pay | Admitting: Family

## 2012-10-28 NOTE — Telephone Encounter (Signed)
2 week supply sent per documentation below. Please call pt to arrange fasting cpe.   Sandford Craze, NP at 10/25/2012 4:40 PM   Status: Signed            OK to give 2 week supply of lipitor and valtrex. Needs ov prior to additional refills.

## 2012-10-30 NOTE — Telephone Encounter (Signed)
No answer, no voicemail.  Will try again later.

## 2012-10-31 NOTE — Telephone Encounter (Signed)
No answer, no voicemail.  Will try again later.

## 2012-11-01 NOTE — Telephone Encounter (Signed)
Letter mailed to pt to arrange fasting physical before further medications can be prescribed.

## 2012-11-01 NOTE — Telephone Encounter (Signed)
No answer, no voicemail.

## 2013-04-22 ENCOUNTER — Telehealth: Payer: Self-pay | Admitting: Family

## 2013-04-22 NOTE — Telephone Encounter (Signed)
Refill- lipitor  Refill- valtrex

## 2013-04-22 NOTE — Telephone Encounter (Signed)
Pt has not been seen since 2013 and a letter was mailed to pt in 10/2012 advising him we would need to see him before we could refill any medications. Denial sent to pharmacy.

## 2013-05-05 ENCOUNTER — Telehealth: Payer: Self-pay | Admitting: Family

## 2013-05-05 ENCOUNTER — Encounter: Payer: Self-pay | Admitting: Family

## 2013-05-05 ENCOUNTER — Ambulatory Visit (INDEPENDENT_AMBULATORY_CARE_PROVIDER_SITE_OTHER): Payer: BC Managed Care – HMO | Admitting: Family

## 2013-05-05 VITALS — BP 110/80 | HR 82 | Temp 98.3°F | Resp 16 | Ht 71.5 in | Wt 191.0 lb

## 2013-05-05 DIAGNOSIS — J309 Allergic rhinitis, unspecified: Secondary | ICD-10-CM

## 2013-05-05 DIAGNOSIS — E785 Hyperlipidemia, unspecified: Secondary | ICD-10-CM

## 2013-05-05 DIAGNOSIS — A6000 Herpesviral infection of urogenital system, unspecified: Secondary | ICD-10-CM

## 2013-05-05 MED ORDER — VALACYCLOVIR HCL 500 MG PO TABS: ORAL_TABLET | ORAL | Status: DC

## 2013-05-05 MED ORDER — ATORVASTATIN CALCIUM 20 MG PO TABS: ORAL_TABLET | ORAL | Status: DC

## 2013-05-05 MED ORDER — FLUTICASONE PROPIONATE 50 MCG/ACT NA SUSP: 2.0000 | Freq: Every day | NASAL | Status: DC

## 2013-05-05 NOTE — Progress Notes (Signed)
Pre visit review using our clinic review tool, if applicable. No additional management support is needed unless otherwise documented below in the visit note. 

## 2013-05-05 NOTE — Assessment & Plan Note (Signed)
Obtain hepatic function and lipid panel today. Refill Lipitor. Follow up in six months.

## 2013-05-05 NOTE — Progress Notes (Signed)
Subjective:    Patient ID: Tyrone Washington, male    DOB: 29-Aug-1955, 58 y.o.   MRN: [id number]  Hyperlipidemia Associated symptoms include shortness of breath. Pertinent negatives include no chest pain or myalgias.   Tyrone Washington is a 58 year old male who presents today for refill request.  Herpes) Patient has not taken his Valtrex for eight months and reports last outbreak was last week and states he's had sporadic breakouts during the past 8 months.   Hyperlipidemia) Patient has not had Lipitor since August 2014. Patient reports he does not exercise and states that he eats a moderate amount of fruits and vegetables with little fast food intake.  Allergies) Patient requesting a refill of his Flonase due to congestion and rhinorrhea from allergies. Denies fever, sore throat, ear pain.   Review of Systems  Constitutional: Negative for fever.  HENT: Positive for congestion and rhinorrhea.   Eyes: Positive for itching.  Respiratory: Positive for shortness of breath.        Patient reports allergies.  Cardiovascular: Negative for chest pain.  Gastrointestinal: Negative for abdominal pain.  Genitourinary: Negative for discharge, difficulty urinating and penile pain.  Musculoskeletal: Negative for myalgias.  Neurological: Negative for dizziness and headaches.   Past Medical History  Diagnosis Date  . History of chicken pox   . Allergy     allergic rhinitis  . Hyperlipidemia     History   Social History  . Marital Status: Single    Spouse Name: N/A    Number of Children: 2  . Years of Education: N/A   Occupational History  .     Social History Main Topics  . Smoking status: Never Smoker   . Smokeless tobacco: Never Used  . Alcohol Use: 3.6 - 7.2 oz/week    6-12 Cans of beer per week  . Drug Use: Not on file  . Sexual Activity: Not on file   Other Topics Concern  . Not on file   Social History Narrative  . No narrative on file    No past surgical history on  file.  Family History  Problem Relation Age of Onset  . Heart disease Other     No Known Allergies  Current Outpatient Prescriptions on File Prior to Visit  Medication Sig Dispense Refill  . Multiple Vitamin (MULTIVITAMIN) tablet Take 1 tablet by mouth daily.         No current facility-administered medications on file prior to visit.    BP 110/80  Pulse 82  Temp(Src) 98.3 F (36.8 C) (Oral)  Resp 16  Ht 5' 11.5" (1.816 m)  Wt 191 lb (86.637 kg)  BMI 26.27 kg/m2  SpO2 99%       Objective:   Physical Exam  Vitals reviewed. Constitutional: He is oriented to person, place, and time. He appears well-nourished.  HENT:  Head: Normocephalic.  Right Ear: External ear normal.  Left Ear: External ear normal.  Mouth/Throat: Oropharynx is clear and moist.  Cardiovascular: Normal rate, regular rhythm, S1 normal, S2 normal and normal heart sounds.   Pulmonary/Chest: Effort normal and breath sounds normal. No respiratory distress. He has no wheezes.  Lymphadenopathy:    He has no cervical adenopathy.  Neurological: He is alert and oriented to person, place, and time.  Skin: Skin is warm and dry.  Psychiatric: He has a normal mood and affect.          Assessment & Plan:  I have personally seen and examined  patient and agree with Alma Friendly NP student's assessment and plan.

## 2013-05-05 NOTE — Patient Instructions (Addendum)
Please return fasting for lab work tomorrow- lab opens at [facility]. Follow up in 3 months for a fasting physical.

## 2013-05-05 NOTE — Telephone Encounter (Signed)
Could you please check with [facility] re: cash pay price for Hepatic function and FLP and notify pt?

## 2013-05-05 NOTE — Assessment & Plan Note (Signed)
Frequent outbreaks. Refill Valtrex for prophylaxis.

## 2013-05-05 NOTE — Assessment & Plan Note (Signed)
Stable. Refill RX for Flonase.

## 2013-05-06 NOTE — Telephone Encounter (Signed)
Per phlebotomist, hepatic function panel is $44 and lipid panel is $55. Pt may contact billing once he received statement to arrange payment plan.  Notified pt and he states will proceed with testing around Thursday. Order is already in the system.

## 2013-05-09 LAB — LIPID PANEL
CHOL/HDL RATIO: 4.2 ratio
CHOLESTEROL: 160 mg/dL (ref 0–200)
HDL: 38 mg/dL — AB (ref >39)
LDL Cholesterol: 110 mg/dL — ABNORMAL HIGH (ref 0–99)
Triglycerides: 58 mg/dL (ref <150)
VLDL: 12 mg/dL (ref 0–40)

## 2013-05-09 LAB — HEPATIC FUNCTION PANEL
ALK PHOS: 51 U/L (ref 39–117)
ALT: 16 U/L (ref 0–53)
AST: 20 U/L (ref 0–37)
Albumin: 3.7 g/dL (ref 3.5–5.2)
BILIRUBIN INDIRECT: 0.2 mg/dL (ref 0.2–1.2)
BILIRUBIN TOTAL: 0.3 mg/dL (ref 0.2–1.2)
Bilirubin, Direct: 0.1 mg/dL (ref 0.0–0.3)
Total Protein: 6.4 g/dL (ref 6.0–8.3)

## 2013-05-12 ENCOUNTER — Encounter: Payer: Self-pay | Admitting: Family

## 2013-10-30 ENCOUNTER — Ambulatory Visit: Payer: Self-pay | Admitting: Physician Assistant

## 2013-10-30 DIAGNOSIS — Z0289 Encounter for other administrative examinations: Secondary | ICD-10-CM

## 2013-10-31 ENCOUNTER — Ambulatory Visit (INDEPENDENT_AMBULATORY_CARE_PROVIDER_SITE_OTHER): Payer: Self-pay | Admitting: Physician Assistant

## 2013-10-31 ENCOUNTER — Encounter: Payer: Self-pay | Admitting: Physician Assistant

## 2013-10-31 VITALS — BP 100/78 | HR 67 | Temp 98.8°F | Resp 16 | Ht 71.5 in | Wt 179.0 lb

## 2013-10-31 DIAGNOSIS — G43009 Migraine without aura, not intractable, without status migrainosus: Secondary | ICD-10-CM

## 2013-10-31 NOTE — Progress Notes (Signed)
Pre visit review using our clinic review tool, if applicable. No additional management support is needed unless otherwise documented below in the visit note/SLS  

## 2013-10-31 NOTE — Patient Instructions (Signed)
Increase your fluid intake.  Caffeine is ok but don't over do it.  Eat a well-balanced diet.  If symptoms recur, take an Excedrin Migraine.  Go see your eye doctor for a vision screen. Call or return to clinic if symptoms recur.  Migraine Headache A migraine headache is an intense, throbbing pain on one or both sides of your head. A migraine can last for 30 minutes to several hours. CAUSES  The exact cause of a migraine headache is not always known. However, a migraine may be caused when nerves in the brain become irritated and release chemicals that cause inflammation. This causes pain. Certain things may also trigger migraines, such as:  Alcohol.  Smoking.  Stress.  Menstruation.  Aged cheeses.  Foods or drinks that contain nitrates, glutamate, aspartame, or tyramine.  Lack of sleep.  Chocolate.  Caffeine.  Hunger.  Physical exertion.  Fatigue.  Medicines used to treat chest pain (nitroglycerine), birth control pills, estrogen, and some blood pressure medicines. SIGNS AND SYMPTOMS  Pain on one or both sides of your head.  Pulsating or throbbing pain.  Severe pain that prevents daily activities.  Pain that is aggravated by any physical activity.  Nausea, vomiting, or both.  Dizziness.  Pain with exposure to bright lights, loud noises, or activity.  General sensitivity to bright lights, loud noises, or smells. Before you get a migraine, you may get warning signs that a migraine is coming (aura). An aura may include:  Seeing flashing lights.  Seeing bright spots, halos, or zigzag lines.  Having tunnel vision or blurred vision.  Having feelings of numbness or tingling.  Having trouble talking.  Having muscle weakness. DIAGNOSIS  A migraine headache is often diagnosed based on:  Symptoms.  Physical exam.  A CT scan or MRI of your head. These imaging tests cannot diagnose migraines, but they can help rule out other causes of  headaches. TREATMENT Medicines may be given for pain and nausea. Medicines can also be given to help prevent recurrent migraines.  HOME CARE INSTRUCTIONS  Only take over-the-counter or prescription medicines for pain or discomfort as directed by your health care provider. The use of long-term narcotics is not recommended.  Lie down in a dark, quiet room when you have a migraine.  Keep a journal to find out what may trigger your migraine headaches. For example, write down:  What you eat and drink.  How much sleep you get.  Any change to your diet or medicines.  Limit alcohol consumption.  Quit smoking if you smoke.  Get 7-9 hours of sleep, or as recommended by your health care provider.  Limit stress.  Keep lights dim if bright lights bother you and make your migraines worse. SEEK IMMEDIATE MEDICAL CARE IF:   Your migraine becomes severe.  You have a fever.  You have a stiff neck.  You have vision loss.  You have muscular weakness or loss of muscle control.  You start losing your balance or have trouble walking.  You feel faint or pass out.  You have severe symptoms that are different from your first symptoms. MAKE SURE YOU:   Understand these instructions.  Will watch your condition.  Will get help right away if you are not doing well or get worse. Document Released: 02/27/2005 Document Revised: 07/14/2013 Document Reviewed: 11/04/2012 [facility] Patient Information 2015 [place], [place]. This information is not intended to replace advice given to you by your health care provider. Make sure you discuss any questions you have  with your health care provider.  

## 2013-11-10 DIAGNOSIS — G43909 Migraine, unspecified, not intractable, without status migrainosus: Secondary | ICD-10-CM | POA: Insufficient documentation

## 2013-11-10 NOTE — Assessment & Plan Note (Signed)
Excedrin migraine for moderate to severe headache.  Stay well hydrated.  Well-balanced diet.  Ok to have some caffeine. Recommend Optometrist as migraine may be stemming from some decreased vision in conjunction with prolonged use of computer.

## 2013-11-10 NOTE — Progress Notes (Signed)
Patient presents to clinic today c/o intermittent flashing lights in from of R eye over the past couple of days.  Endorses mild headache with nausea.  Denies photophobia or phonophobia.  Denies emesis.  Has been staring at a computer screen a lot more often with new position at work.  Denies lightheadedness, dizziness, loss of vision.  Denies recent trauma or injury.  Tries to eat a well-balanced diet.  Past Medical History  Diagnosis Date  . History of chicken pox   . Allergy     allergic rhinitis  . Hyperlipidemia     Current Outpatient Prescriptions on File Prior to Visit  Medication Sig Dispense Refill  . atorvastatin (LIPITOR) 20 MG tablet TAKE 1 TABLET EVERY DAY  30 tablet  5  . fluticasone (FLONASE) 50 MCG/ACT nasal spray Place 2 sprays into both nostrils daily.  16 g  5  . Multiple Vitamin (MULTIVITAMIN) tablet Take 1 tablet by mouth daily.        . valACYclovir (VALTREX) 500 MG tablet TAKE 1 TABLET EVERY DAY  30 tablet  5   No current facility-administered medications on file prior to visit.    No Known Allergies  Family History  Problem Relation Age of Onset  . Heart disease Other     History   Social History  . Marital Status: Single    Spouse Name: N/A    Number of Children: 2  . Years of Education: N/A   Occupational History  .     Social History Main Topics  . Smoking status: Never Smoker   . Smokeless tobacco: Never Used  . Alcohol Use: 3.6 - 7.2 oz/week    6-12 Cans of beer per week  . Drug Use: None  . Sexual Activity: None   Other Topics Concern  . None   Social History Narrative  . None   Review of Systems - See HPI.  All other ROS are negative.  BP 100/78  Pulse 67  Temp(Src) 98.8 F (37.1 C) (Oral)  Resp 16  Ht 5' 11.5" (1.816 m)  Wt 179 lb (81.194 kg)  BMI 24.62 kg/m2  SpO2 99%  Physical Exam  Vitals reviewed. Constitutional: He is oriented to person, place, and time and well-developed, well-nourished, and in no distress.  HENT:   Head: Normocephalic and atraumatic.  Right Ear: External ear normal.  Left Ear: External ear normal.  Nose: Nose normal.  Mouth/Throat: Oropharynx is clear and moist. No oropharyngeal exudate.  Eyes: Conjunctivae, EOM and lids are normal. Pupils are equal, round, and reactive to light. No scleral icterus.  Cardiovascular: Normal rate, regular rhythm, normal heart sounds and intact distal pulses.   Pulmonary/Chest: Effort normal and breath sounds normal. No respiratory distress. He has no wheezes. He has no rales. He exhibits no tenderness.  Neurological: He is alert and oriented to person, place, and time. No cranial nerve deficit. GCS score is 15.  Skin: Skin is warm and dry. No rash noted.  Psychiatric: Affect normal.   Assessment/Plan: Migraine headache Excedrin migraine for moderate to severe headache.  Stay well hydrated.  Well-balanced diet.  Ok to have some caffeine. Recommend Optometrist as migraine may be stemming from some decreased vision in conjunction with prolonged use of computer.

## 2014-07-28 ENCOUNTER — Other Ambulatory Visit: Payer: Self-pay | Admitting: Family

## 2014-07-29 NOTE — Telephone Encounter (Signed)
Called pt and pt stated he will call back to schedule a appointment due to work conflict and financial concerns.

## 2014-07-29 NOTE — Telephone Encounter (Signed)
Pt last seen by PCP 04/2013 and advised 6 month follow up.  Pt is past due. 2 week supply given. Please call pt to arrange follow up within that time.

## 2015-07-09 ENCOUNTER — Telehealth: Payer: Self-pay | Admitting: Family

## 2015-07-09 NOTE — Telephone Encounter (Signed)
Needs office visit prior to refill.

## 2015-07-09 NOTE — Telephone Encounter (Signed)
Notified pt and he requests earlier appt. Appt r/s for 07/16/15 at 1:30pm.

## 2015-07-09 NOTE — Telephone Encounter (Signed)
Melissa-- pt has scheduled and appt with you on 08/10/15 but he has not been seen in the office since 2015.  Please advise request?

## 2015-07-09 NOTE — Telephone Encounter (Signed)
Pt is requesting a refill on his valACYclovir Rx.    Pharmacy: [place]: [phone]

## 2015-07-16 ENCOUNTER — Telehealth: Payer: Self-pay | Admitting: Family

## 2015-07-16 ENCOUNTER — Encounter: Payer: Self-pay | Admitting: Family

## 2015-07-16 ENCOUNTER — Ambulatory Visit (INDEPENDENT_AMBULATORY_CARE_PROVIDER_SITE_OTHER): Payer: Self-pay | Admitting: Family

## 2015-07-16 VITALS — BP 102/62 | HR 94 | Temp 98.3°F | Resp 16 | Ht 71.5 in | Wt 174.6 lb

## 2015-07-16 DIAGNOSIS — R9431 Abnormal electrocardiogram [ECG] [EKG]: Secondary | ICD-10-CM | POA: Insufficient documentation

## 2015-07-16 DIAGNOSIS — Z0000 Encounter for general adult medical examination without abnormal findings: Secondary | ICD-10-CM

## 2015-07-16 DIAGNOSIS — R221 Localized swelling, mass and lump, neck: Secondary | ICD-10-CM

## 2015-07-16 LAB — HEPATIC FUNCTION PANEL
ALBUMIN: 4 g/dL (ref 3.5–5.2)
ALT: 12 U/L (ref 0–53)
AST: 14 U/L (ref 0–37)
Alkaline Phosphatase: 46 U/L (ref 39–117)
Bilirubin, Direct: 0.1 mg/dL (ref 0.0–0.3)
Total Bilirubin: 0.4 mg/dL (ref 0.2–1.2)
Total Protein: 6.9 g/dL (ref 6.0–8.3)

## 2015-07-16 LAB — CBC WITH DIFFERENTIAL/PLATELET
Basophils Absolute: 0 10*3/uL (ref 0.0–0.1)
Basophils Relative: 1.1 % (ref 0.0–3.0)
EOS PCT: 5.5 % — AB (ref 0.0–5.0)
Eosinophils Absolute: 0.2 10*3/uL (ref 0.0–0.7)
HCT: 39.8 % (ref 39.0–52.0)
HEMOGLOBIN: 13.5 g/dL (ref 13.0–17.0)
Lymphocytes Relative: 42.2 % (ref 12.0–46.0)
Lymphs Abs: 1.5 10*3/uL (ref 0.7–4.0)
MCHC: 33.9 g/dL (ref 30.0–36.0)
MCV: 88.3 fl (ref 78.0–100.0)
MONOS PCT: 10.5 % (ref 3.0–12.0)
Monocytes Absolute: 0.4 10*3/uL (ref 0.1–1.0)
Neutro Abs: 1.4 10*3/uL (ref 1.4–7.7)
Neutrophils Relative %: 40.7 % — ABNORMAL LOW (ref 43.0–77.0)
Platelets: 268 10*3/uL (ref 150.0–400.0)
RBC: 4.5 Mil/uL (ref 4.22–5.81)
RDW: 14.6 % (ref 11.5–15.5)
WBC: 3.5 10*3/uL — AB (ref 4.0–10.5)

## 2015-07-16 LAB — BASIC METABOLIC PANEL
BUN: 15 mg/dL (ref 6–23)
CO2: 30 mEq/L (ref 19–32)
CREATININE: 1 mg/dL (ref 0.40–1.50)
Calcium: 9.6 mg/dL (ref 8.4–10.5)
Chloride: 105 mEq/L (ref 96–112)
GFR: 97.51 mL/min (ref >60.00)
GLUCOSE: 64 mg/dL — AB (ref 70–99)
POTASSIUM: 3.7 meq/L (ref 3.5–5.1)
Sodium: 141 mEq/L (ref 135–145)

## 2015-07-16 LAB — LIPID PANEL
CHOLESTEROL: 262 mg/dL — AB (ref 0–200)
HDL: 45.9 mg/dL (ref >39.00)
LDL CALC: 196 mg/dL — AB (ref 0–99)
NonHDL: 216.01
Total CHOL/HDL Ratio: 6
Triglycerides: 98 mg/dL (ref 0.0–149.0)
VLDL: 19.6 mg/dL (ref 0.0–40.0)

## 2015-07-16 LAB — URINALYSIS, ROUTINE W REFLEX MICROSCOPIC
BILIRUBIN URINE: NEGATIVE
Leukocytes, UA: NEGATIVE
NITRITE: NEGATIVE
PH: 5.5 (ref 5.0–8.0)
SPECIFIC GRAVITY, URINE: 1.025 (ref 1.000–1.030)
Total Protein, Urine: NEGATIVE
Urine Glucose: NEGATIVE
Urobilinogen, UA: 1 (ref 0.0–1.0)

## 2015-07-16 LAB — TSH: TSH: 1.32 u[IU]/mL (ref 0.35–4.50)

## 2015-07-16 MED ORDER — VALACYCLOVIR HCL 500 MG PO TABS: 500.0000 mg | ORAL_TABLET | Freq: Every day | ORAL | Status: DC

## 2015-07-16 MED FILL — VALACYCLOVIR HCL 500 MG TAB: 500 | 30 days supply | Qty: 30 | Fill #0

## 2015-07-16 NOTE — Progress Notes (Signed)
Pre visit review using our clinic review tool, if applicable. No additional management support is needed unless otherwise documented below in the visit note. 

## 2015-07-16 NOTE — Assessment & Plan Note (Signed)
Continue healthy diet, exercise.  Colonoscopy is up to date.  Counseled patient on smoking cessation. He wishes to proceed with standard lab work today.

## 2015-07-16 NOTE — Telephone Encounter (Signed)
Per verbal from PCP, EKG is abnormal and pt needs referral to cardiology. Discussed with pt that EKG indicates possible prior damage to heart and he needs referral to cardiology. Pt states he wants to hold off on cardiology referral at this time due to lack of insurance. Advised pt to call us when he is able to proceed with referral and he voices understanding.

## 2015-07-16 NOTE — Patient Instructions (Addendum)
Please schedule dental and vision. Complete lab work prior to leaving. Call the [facility] office to inquire about applying for patient assistance. Also, please look into purchasing an Set designer. Let me know if you obtain insurance and you would like a referral back to ENT doctor for removal of your neck mass. [phone] Follow up in 3 months.

## 2015-07-16 NOTE — Assessment & Plan Note (Addendum)
I am very concerned about this neck mass being a possible underlying malignancy given the massive growth.  Informed pt that it is possible that the neck mass is cancer. I advised him that I would like for him to have the mass removed. He is uninsured and tells me that he cannot afford the surgery at this time.  I advised him as follows:   Call the [place] office to inquire about applying for patient assistance. Also, please look into purchasing an Set designer. Let me know if you obtain insurance and you would like a referral back to ENT doctor for removal of your neck mass.

## 2015-07-16 NOTE — Progress Notes (Signed)
Unfortunately you are stuck. You can force him to take the referral. He could go to [facility] and discuss his case and he might get some assistance or be placed on a reduced fee schedule then we might be able to talk him into the referral after that.

## 2015-07-16 NOTE — Progress Notes (Signed)
Subjective:    Patient ID: Tyrone Washington, male    DOB: [birth year], 60 y.o.   MRN: [id number]  HPI  Patient presents today for complete physical.  Immunizations: tetanus 2011 Diet: healthy Exercise: walks some Colonoscopy: 2009 Dental: due Vision:  2002 Smokes cigars every other day.  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  When I saw patient back in 2012 mass was noted to be present in the left anterior neck with a hard/pea sized minimally mobile mass.  He underwent US at that time and was referred back to ENT for further evaluation/biopsy.  [place] at that time noted:  1. Left thyroidectomy. 2. Interval left anterior neck circumscribed peripherally vascular ovoid solid superficial nodule measuring 1.4 X 0.7 X 0.8 cm is nonspecific. Differential diagnosis includes interval slight reactive and possible neoplastic solitary adenopathy or superficial abscess. Recommend tissue diagnosis for further evaluation.  He was evaluated by Dr. Hassell Done ENT who recommended excision of the mass.  The patient tells me that he chose to "back out" of the procedure. He states that his decision to "back out" was mainly financial since he tells me that he had to come up with $800 for the surgery.  Today he tells me that the mass has enlarged. Denies dysphagia or pain.  He is unfortunately without insurance at this time, but he is looking for full time work with benefits.   Review of Systems  Constitutional: Negative for unexpected weight change.  HENT: Negative for rhinorrhea.   Respiratory: Negative for cough.   Cardiovascular: Negative for leg swelling.  Gastrointestinal: Negative for diarrhea, constipation and blood in stool.  Genitourinary: Negative for dysuria and frequency.  Musculoskeletal:       Occasional right knee pain  Skin: Positive for rash.       back  Neurological: Negative for headaches.  Hematological: Does not bruise/bleed  easily.  Psychiatric/Behavioral:       Denies depression/anxiety   Past Medical History  Diagnosis Date  . History of chicken pox   . Allergy     allergic rhinitis  . Hyperlipidemia      Social History   Social History  . Marital Status: Single    Spouse Name: N/A  . Number of Children: 2  . Years of Education: N/A   Occupational History  .     Social History Main Topics  . Smoking status: Current Every Day Smoker  . Smokeless tobacco: Never Used     Comment: Smokes 1 cigar every other day  . Alcohol Use: 3.6 - 7.2 oz/week    6-12 Cans of beer per week  . Drug Use: Not on file  . Sexual Activity: Not on file   Other Topics Concern  . 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     Denies hobbies   No pets.     History reviewed. No pertinent past surgical history.  Family History  Problem Relation Age of Onset  . Heart disease Father     patient is unsure of history  . CVA Father     No Known Allergies  Current Outpatient Prescriptions on File Prior  to Visit  Medication Sig Dispense Refill  . Multiple Vitamin (MULTIVITAMIN) tablet Take 1 tablet by mouth daily.      . valACYclovir (VALTREX) 500 MG tablet TAKE 1 TABLET BY MOUTH DAILY 15 tablet 0  . atorvastatin (LIPITOR) 20 MG tablet TAKE 1 TABLET EVERY DAY (Patient not taking: Reported on 07/16/2015) 30 tablet 5   No current facility-administered medications on file prior to visit.    BP 102/62 mmHg  Pulse 94  Temp(Src) 98.3 F (36.8 C) (Oral)  Resp 16  Ht 5' 11.5" (1.816 m)  Wt 174 lb 9.6 oz (79.198 kg)  BMI 24.01 kg/m2  SpO2 100%       Objective:   Physical Exam  Constitutional: He is oriented to person, place, and time. He appears well-developed  and well-nourished. No distress.  HENT:  Head: Normocephalic and atraumatic.  Right Ear: Tympanic membrane and ear canal normal.  Left Ear: Tympanic membrane and ear canal normal.  Mouth/Throat: No oropharyngeal exudate, posterior oropharyngeal edema or posterior oropharyngeal erythema.  Neck:    Firm, non-tender multilobular large baseball sized mass noted left anterior neck.    Cardiovascular: Normal rate and regular rhythm.   No murmur heard. Pulmonary/Chest: Effort normal and breath sounds normal. No respiratory distress. He has no wheezes. He has no rales.  Musculoskeletal: He exhibits no edema.  Lymphadenopathy:    He has no cervical adenopathy.  Neurological: He is alert and oriented to person, place, and time.  Skin: Skin is warm and dry.  Psychiatric: He has a normal mood and affect. His behavior is normal. Thought content normal.          Assessment & Plan:  EKG is performed today. Notes anterior TWI in lead I and II, also in V4-6.  I am unable to view the prior EKG tracing which was performed in our [facility] system in 2012.  I would like to refer him to cardiology for further work up.  Pt declines referral to cardiology at this time due to cost.

## 2015-07-17 LAB — PSA: PSA: 0.7 ng/mL (ref <=4.00)

## 2015-07-17 LAB — HEPATITIS C ANTIBODY: HCV Ab: NEGATIVE

## 2015-07-18 ENCOUNTER — Telehealth: Payer: Self-pay | Admitting: Family

## 2015-07-18 MED ORDER — SIMVASTATIN 40 MG PO TABS: 40.0000 mg | ORAL_TABLET | Freq: Every day | ORAL | Status: DC

## 2015-07-18 NOTE — Telephone Encounter (Signed)
Please let pt know that his cholesterol is very high. I would like pt to start simvastatin, repeat lipids in 6 weeks, dx hyperlipidemia. Please remind pt to call [place] business office to look into patient assistance.  Let me know when he is ready for me to refer him back to the ENT for removal of his neck mass.

## 2015-07-19 ENCOUNTER — Other Ambulatory Visit: Payer: Self-pay | Admitting: Family

## 2015-07-19 DIAGNOSIS — E785 Hyperlipidemia, unspecified: Secondary | ICD-10-CM

## 2015-07-19 MED FILL — SIMVASTATIN 40 MG TABLET: 40 | 30 days supply | Qty: 30 | Fill #0

## 2015-07-19 NOTE — Telephone Encounter (Signed)
Notified pt and he voices understanding. Scheduled lab appt for 08/30/15 at 7:15am. Order entered. He will let us know when he is ready to proceed with ENT referral.

## 2015-08-10 ENCOUNTER — Encounter: Payer: Self-pay | Admitting: Family

## 2015-08-20 ENCOUNTER — Telehealth: Payer: Self-pay | Admitting: Family

## 2015-08-20 NOTE — Telephone Encounter (Signed)
Could you please contact [facility] in [place] at [phone] and ask them if they can see an uninsured patient from [employer]?  If so, would you please give the patient the contact info to schedule an appointment along with my most recent office note and [facility] records/path report.  He will need to bring these records with him to his appointment. It is my hope that they could make a reduced self pay referral for him for his neck mass.

## 2015-08-20 NOTE — Telephone Encounter (Signed)
Attempted to reach facility and left message on voicemail to return my call.

## 2015-08-23 NOTE — Telephone Encounter (Signed)
Spoke with below facility and confirmed that they will see pt from [employer] as long as pt qualifies financially. States pt needs to come to their office to complete application and if qualifies they will schedule him an appointment. Below records printed and placed at front desk for pt to pick up. Office address and contact # emailed to pt per pt's request ([email or web address]).  [address], [place] [address]. Hours 8am to 5pm closed daily from 12-1pm.

## 2015-08-30 ENCOUNTER — Other Ambulatory Visit: Payer: Self-pay

## 2015-09-06 ENCOUNTER — Other Ambulatory Visit: Payer: Self-pay

## 2015-10-11 ENCOUNTER — Encounter: Payer: Self-pay | Admitting: Family

## 2015-10-11 ENCOUNTER — Telehealth: Payer: Self-pay | Admitting: Family

## 2015-10-11 ENCOUNTER — Ambulatory Visit (INDEPENDENT_AMBULATORY_CARE_PROVIDER_SITE_OTHER): Payer: BLUE CROSS/BLUE SHIELD | Admitting: Family

## 2015-10-11 VITALS — BP 110/80 | HR 70 | Temp 97.6°F | Resp 16 | Ht 72.0 in | Wt 172.8 lb

## 2015-10-11 DIAGNOSIS — E785 Hyperlipidemia, unspecified: Secondary | ICD-10-CM

## 2015-10-11 DIAGNOSIS — R221 Localized swelling, mass and lump, neck: Secondary | ICD-10-CM

## 2015-10-11 DIAGNOSIS — R9431 Abnormal electrocardiogram [ECG] [EKG]: Secondary | ICD-10-CM

## 2015-10-11 LAB — LIPID PANEL
CHOL/HDL RATIO: 4
Cholesterol: 217 mg/dL — ABNORMAL HIGH (ref 0–200)
HDL: 53.2 mg/dL (ref >39.00)
LDL CALC: 151 mg/dL — AB (ref 0–99)
NONHDL: 163.91
Triglycerides: 65 mg/dL (ref 0.0–149.0)
VLDL: 13 mg/dL (ref 0.0–40.0)

## 2015-10-11 MED ORDER — SIMVASTATIN 40 MG PO TABS: 40.0000 mg | ORAL_TABLET | Freq: Every day | ORAL | 5 refills | Status: DC

## 2015-10-11 MED ORDER — VALACYCLOVIR HCL 500 MG PO TABS: 500.0000 mg | ORAL_TABLET | Freq: Every day | ORAL | 5 refills | Status: DC

## 2015-10-11 MED ORDER — SIMVASTATIN 40 MG PO TABS: 80.0000 mg | ORAL_TABLET | Freq: Every day | ORAL | 5 refills | Status: DC

## 2015-10-11 MED FILL — VALACYCLOVIR HCL 500 MG TAB: 500 | 30 days supply | Qty: 30 | Fill #0

## 2015-10-11 MED FILL — SIMVASTATIN 40 MG TABLET: 40 | 30 days supply | Qty: 30 | Fill #0

## 2015-10-11 NOTE — Progress Notes (Signed)
Subjective:    Patient ID: Tyrone Washington, male    DOB: [birth year], 60 y.o.   MRN: [id number]  HPI  Mr. Broxterman is a 60 yr old male who presents today for follow up of his left neck mass.  We tried to get patient in to a self pay program in [place] last visit, however he did not follow through with this referral. Fortunately, since his last visit he has obtained health insurance.  The patient reports feeling well but notes that he thinks that the mass may be enlarging. He denies neck pain/tenderness.  Abnormal EKG- was referred to cardiology last visit.  He did not follow through with the referral.  Hyperlipidemia- reports compliance with statin.   Review of Systems See HPI  Past Medical History:  Diagnosis Date  . Allergy    allergic rhinitis  . History of chicken pox   . Hyperlipidemia      Social History   Social History  . Marital status: Single    Spouse name: N/A  . Number of children: 2  . Years of education: N/A   Occupational History  .  Sheets   Social History Main Topics  . Smoking status: Current Every Day Smoker  . Smokeless tobacco: Never Used     Comment: Smokes 1 cigar every other day  . Alcohol use 0.6 oz/week    1 Cans of beer per week  . Drug use: Unknown  . Sexual activity: Not on file   Other Topics Concern  . 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     Denies hobbies   No pets.     No past surgical history on file.  Family History  Problem Relation Age of Onset  . Heart disease Father     patient is unsure of history  . CVA Father     No Known Allergies  Current Outpatient Prescriptions on File Prior to Visit  Medication Sig Dispense Refill  . Multiple Vitamin (MULTIVITAMIN)  tablet Take 1 tablet by mouth daily.      . simvastatin (ZOCOR) 40 MG tablet Take 1 tablet (40 mg total) by mouth at bedtime. 30 tablet 3  . valACYclovir (VALTREX) 500 MG tablet Take 1 tablet (500 mg total) by mouth daily. 30 tablet 5   No current facility-administered medications on file prior to visit.     BP 110/80   Pulse 70   Temp 97.6 F (36.4 C) (Oral)   Resp 16   Ht 6' (1.829 m)   Wt 172 lb 12.8 oz (78.4 kg)   SpO2 98% Comment: room air  BMI 23.44 kg/m       Objective:   Physical Exam  Constitutional: He is oriented to person, place, and time. He appears well-developed and well-nourished. No distress.  HENT:  Head: Normocephalic and atraumatic.  Right Ear: Tympanic membrane and ear canal normal.  Left Ear: Tympanic membrane and ear canal normal.  Mouth/Throat: No oropharyngeal exudate, posterior oropharyngeal edema or posterior oropharyngeal erythema.  Neck:    Firm, non-tender multilobular large baseball sized mass noted left anterior neck.  (seems slightly larger than last visit)  Cardiovascular: Normal rate and  regular rhythm.   No murmur heard. Pulmonary/Chest: Effort normal and breath sounds normal. No respiratory distress. He has no wheezes. He has no rales.  Lymphadenopathy:    He has no cervical adenopathy.  Neurological: He is alert and oriented to person, place, and time.  Skin: Skin is warm and dry.  Psychiatric: He has a normal mood and affect. His behavior is normal. Thought content normal.          Assessment & Plan:

## 2015-10-11 NOTE — Assessment & Plan Note (Signed)
Enlarging, he is now agreeable to return to ENT for further evaluation and excision and I stressed the importance of following through with this.

## 2015-10-11 NOTE — Progress Notes (Signed)
Pre visit review using our clinic review tool, if applicable. No additional management support is needed unless otherwise documented below in the visit note. 

## 2015-10-11 NOTE — Patient Instructions (Addendum)
Please complete lab work prior to leaving (cholesterol).  You will be contacted about your referral to ENT (Dr. Laurance Flatten for your neck mass) and Cardiology (for evaluation of your abnormal EKG). If you have not heard back about these appointments in 1 week, please let me know.

## 2015-10-11 NOTE — Telephone Encounter (Signed)
Cholesterol is improving but still mildly elevated. I sent rx earlier for simvastatin 40mg  daily. I would like him to increase to 80mg  (2 tabs) once daily. Repeat FLP in 6 weeks. Dx hyperlipidemia.

## 2015-10-11 NOTE — Assessment & Plan Note (Signed)
Will re-initiate cardiology referral.

## 2015-10-11 NOTE — Assessment & Plan Note (Signed)
Obtain follow up lipid panel.

## 2015-10-12 NOTE — Telephone Encounter (Signed)
Left message for pt to return my call.

## 2015-10-13 NOTE — Addendum Note (Signed)
Addended by: Kelle Darting A on: 10/13/2015 04:32 PM   Modules accepted: Orders

## 2015-10-13 NOTE — Telephone Encounter (Signed)
Pt notified and scheduled lab  appt for 11/24/15. Future order entered.

## 2015-10-19 ENCOUNTER — Ambulatory Visit: Payer: Self-pay | Admitting: Family

## 2015-11-03 MED FILL — SIMVASTATIN 40 MG TABLET: 40 | 30 days supply | Qty: 30 | Fill #1

## 2015-11-03 MED FILL — VALACYCLOVIR HCL 500 MG TAB: 500 | 30 days supply | Qty: 30 | Fill #1

## 2015-11-04 ENCOUNTER — Ambulatory Visit: Payer: BLUE CROSS/BLUE SHIELD | Admitting: Interventional Cardiology

## 2015-11-22 ENCOUNTER — Telehealth: Payer: Self-pay | Admitting: Family

## 2015-11-22 NOTE — Telephone Encounter (Signed)
Pt would like a call back to be advised on his cholesterol Rx.    CB: [phone]

## 2015-11-22 NOTE — Telephone Encounter (Signed)
Spoke with pt. He states he was unsure if he should continue taking simvastatin 2 tablets daily. Advised him per 10/11/15 phone note that he should continue taking 2 tablets daily and he is due to repeat his lipid panel on 11/24/15. Pt is aware.

## 2015-11-24 ENCOUNTER — Other Ambulatory Visit: Payer: BLUE CROSS/BLUE SHIELD

## 2015-11-25 ENCOUNTER — Other Ambulatory Visit (INDEPENDENT_AMBULATORY_CARE_PROVIDER_SITE_OTHER): Payer: BLUE CROSS/BLUE SHIELD

## 2015-11-25 DIAGNOSIS — E785 Hyperlipidemia, unspecified: Secondary | ICD-10-CM | POA: Diagnosis not present

## 2015-11-25 LAB — LIPID PANEL
CHOL/HDL RATIO: 3
CHOLESTEROL: 149 mg/dL (ref 0–200)
HDL: 50.1 mg/dL (ref >39.00)
LDL Cholesterol: 89 mg/dL (ref 0–99)
NonHDL: 99.16
TRIGLYCERIDES: 52 mg/dL (ref 0.0–149.0)
VLDL: 10.4 mg/dL (ref 0.0–40.0)

## 2015-11-26 ENCOUNTER — Encounter: Payer: Self-pay | Admitting: Family

## 2015-12-03 MED FILL — SIMVASTATIN 40 MG TABLET: 40 | 30 days supply | Qty: 30 | Fill #2

## 2015-12-13 ENCOUNTER — Encounter (INDEPENDENT_AMBULATORY_CARE_PROVIDER_SITE_OTHER): Payer: Self-pay

## 2015-12-13 ENCOUNTER — Ambulatory Visit (INDEPENDENT_AMBULATORY_CARE_PROVIDER_SITE_OTHER): Payer: BLUE CROSS/BLUE SHIELD | Admitting: Interventional Cardiology

## 2015-12-13 ENCOUNTER — Encounter: Payer: Self-pay | Admitting: Interventional Cardiology

## 2015-12-13 VITALS — BP 112/70 | HR 81 | Ht 72.0 in | Wt 176.0 lb

## 2015-12-13 DIAGNOSIS — Z01810 Encounter for preprocedural cardiovascular examination: Secondary | ICD-10-CM

## 2015-12-13 DIAGNOSIS — R9431 Abnormal electrocardiogram [ECG] [EKG]: Secondary | ICD-10-CM

## 2015-12-13 DIAGNOSIS — E785 Hyperlipidemia, unspecified: Secondary | ICD-10-CM

## 2015-12-13 NOTE — Patient Instructions (Signed)

## 2015-12-13 NOTE — Progress Notes (Signed)
Cardiology Office Note   Date:  12/13/2015   ID:  Tyrone Washington, DOB 1955/07/04, MRN [id number]  PCP:  Nance Pear., NP    No chief complaint on file. abnormal ECG   Wt Readings from Last 3 Encounters:  12/13/15 176 lb (79.8 kg)  10/11/15 172 lb 12.8 oz (78.4 kg)  07/16/15 174 lb 9.6 oz (79.2 kg)       History of Present Illness: Tyrone Washington is a 60 y.o. male  Who was found to have an abnormal ECG by PMD on routine physical.  He used to walk more.  He has not been walking as much over the past 4 months.  He is now working long hours and this takes up his free time.  He walks at work and lifts things. No CP or SHOB with these activities.    He had a stress test between 2004 and 2008 in [place].  That was normal.    He smokes cigars.  He is not trying to quit.  No known family h/o CAD.  Father had a stroke.   If he overeats, he gets some GERD sx.  No exertional CP even lifting heavy items at work- up to 75 lbs.     Past Medical History:  Diagnosis Date  . Allergy    allergic rhinitis  . History of chicken pox   . Hyperlipidemia     Past Surgical History:  Procedure Laterality Date  . NO PAST SURGERIES       Current Outpatient Prescriptions  Medication Sig Dispense Refill  . Multiple Vitamin (MULTIVITAMIN) tablet Take 1 tablet by mouth daily.      . simvastatin (ZOCOR) 40 MG tablet Take 2 tablets (80 mg total) by mouth at bedtime. 30 tablet 5  . valACYclovir (VALTREX) 500 MG tablet Take 1 tablet (500 mg total) by mouth daily. 30 tablet 5   No current facility-administered medications for this visit.     Allergies:   Review of patient's allergies indicates no known allergies.    Social History:  The patient  reports that he has been smoking.  He has never used smokeless tobacco. He reports that he drinks about 0.6 oz of alcohol per week .   Family History:  The patient's family history includes CVA in his father; Heart disease in his  father.    ROS:  Please see the history of present illness.   Otherwise, review of systems are positive for neck mass- awaiting surgery for goiter.   All other systems are reviewed and negative.    PHYSICAL EXAM: VS:  BP 112/70   Pulse 81   Ht 6' (1.829 m)   Wt 176 lb (79.8 kg)   BMI 23.87 kg/m  , BMI Body mass index is 23.87 kg/m. GEN: Well nourished, well developed, in no acute distress  HEENT: normal  Neck: no JVD, carotid bruits, large neck  mass Cardiac: RRR; no murmurs, rubs, or gallops,no edema  Respiratory:  clear to auscultation bilaterally, normal work of breathing GI: soft, nontender, nondistended, + BS MS: no deformity or atrophy  Skin: warm and dry, no rash Neuro:  Strength and sensation are intact Psych: euthymic mood, full affect   EKG:   The ekg ordered today demonstrates NSR, nonspecific lateral ST-T wave changes.  Less pronounced compared to ECG in 20   Recent Labs: 07/16/2015: ALT 12; BUN 15; Creatinine, Ser 1.00; Hemoglobin 13.5; Platelets 268.0; Potassium 3.7; Sodium 141; TSH 1.32  Lipid Panel    Component Value Date/Time   CHOL 149 11/25/2015 0723   TRIG 52.0 11/25/2015 0723   HDL 50.10 11/25/2015 0723   CHOLHDL 3 11/25/2015 0723   VLDL 10.4 11/25/2015 0723   LDLCALC 89 11/25/2015 0723     Other studies Reviewed: Additional studies/ records that were reviewed today with results demonstrating: Compared today's ECG to ECG from May 2017..   ASSESSMENT AND PLAN:  1. Abnormal ECG: Lateral T wave changes less pronounced than prior. No symptoms with exertion. He is physically active without any problems. We discussed the possibility of stress testing. He is saving up to have his neck surgery performed. At this point, I'm not sure that a stress test is going to change our management. He is hesitant to get another bill. I think is reasonable to hold off at this point. If his symptoms change, would reconsider echo or stress test. 2. Hyperlipidemia: Lipids  well controlled. Continue simvastatin. 3. Preoperative evaluation: At this point, given his lack of symptoms and his active lifestyle, no further cardiac testing needed for neck surgery. 4. Of note, he no that he had what was called an abnormal ECG in the past and he had a negative stress test in [place].   Current medicines are reviewed at length with the patient today.  The patient concerns regarding his medicines were addressed.  The following changes have been made:  No change  Labs/ tests ordered today include:  No orders of the defined types were placed in this encounter.   Recommend 150 minutes/week of aerobic exercise Low fat, low carb, high fiber diet recommended  Disposition:   FU in prn   Signed, Larae Grooms, MD  12/13/2015 11:35 AM    [address], [place], [place]  [address] Phone: [phone]; Fax: [phone]

## 2016-06-01 MED FILL — SIMVASTATIN 40 MG TABLET: 40 | 30 days supply | Qty: 30 | Fill #3

## 2016-06-01 MED FILL — VALACYCLOVIR HCL 500 MG TAB: 500 | 30 days supply | Qty: 30 | Fill #2

## 2016-10-02 MED FILL — VALACYCLOVIR HCL 500 MG TAB: 500 | 30 days supply | Qty: 30 | Fill #3

## 2016-10-02 MED FILL — SIMVASTATIN 40 MG TABLET: 40 | 30 days supply | Qty: 30 | Fill #4

## 2017-01-01 NOTE — Progress Notes (Signed)
Cardiology Office Note   Date:  01/02/2017   ID:  Tyrone Washington, DOB 1956-01-07, MRN [id number]  PCP:  [name], NP    No chief complaint on file. abnormal ECG   Wt Readings from Last 3 Encounters:  01/02/17 173 lb (78.5 kg)  12/13/15 176 lb (79.8 kg)  10/11/15 172 lb 12.8 oz (78.4 kg)       History of Present Illness: Tyrone Washington is a 61 y.o. male  Who I saw for abnormal ECG prior to neck surgery in 2017 for a mass.  He still has not had the surgery.  He states it is a cost issue.    Denies : Chest pain. Dizziness. Leg edema. Nitroglycerin use. Orthopnea. Palpitations. Paroxysmal nocturnal dyspnea. Shortness of breath. Syncope.   He walks at work.  No problems with that.  He is on his feet 10 hours a day.    He states the mass is not cancerous based on biopsy result.   He continues to smoke cigars.  He is not trying to quit.    Past Medical History:  Diagnosis Date  . Allergy    allergic rhinitis  . History of chicken pox   . Hyperlipidemia     Past Surgical History:  Procedure Laterality Date  . NO PAST SURGERIES       Current Outpatient Prescriptions  Medication Sig Dispense Refill  . Multiple Vitamin (MULTIVITAMIN) tablet Take 1 tablet by mouth daily.      . simvastatin (ZOCOR) 40 MG tablet Take 2 tablets (80 mg total) by mouth at bedtime. 30 tablet 5  . valACYclovir (VALTREX) 500 MG tablet Take 1 tablet (500 mg total) by mouth daily. 30 tablet 5   No current facility-administered medications for this visit.     Allergies:   Patient has no known allergies.    Social History:  The patient  reports that he has been smoking.  He has never used smokeless tobacco. He reports that he drinks about 0.6 oz of alcohol per week .   Family History:  The patient's family history includes CVA in his father; Heart disease in his father.    ROS:  Please see the history of present illness.   Otherwise, review of systems are positive for  neck mass enlarging.   All other systems are reviewed and negative.    PHYSICAL EXAM: VS:  BP 110/72   Pulse 63   Ht 6' (1.829 m)   Wt 173 lb (78.5 kg)   SpO2 99%   BMI 23.46 kg/m  , BMI Body mass index is 23.46 kg/m. GEN: Well nourished, well developed, in no acute distress  HEENT: normal  Neck: no JVD, carotid bruits, ; large neck mass Cardiac: RRR; no murmurs, rubs, or gallops,no edema  Respiratory:  clear to auscultation bilaterally, normal work of breathing GI: soft, nontender, nondistended, + BS MS: no deformity or atrophy  Skin: warm and dry, no rash Neuro:  Strength and sensation are intact Psych: euthymic mood, full affect   EKG:   The ekg ordered today demonstrates NSR, non specific ST changes   Recent Labs: No results found for requested labs within last 8760 hours.   Lipid Panel    Component Value Date/Time   CHOL 149 11/25/2015 0723   TRIG 52.0 11/25/2015 0723   HDL 50.10 11/25/2015 0723   CHOLHDL 3 11/25/2015 0723   VLDL 10.4 11/25/2015 0723   LDLCALC 89 11/25/2015 0723  Other studies Reviewed: Additional studies/ records that were reviewed today with results demonstrating: prior ECG reviewed.   ASSESSMENT AND PLAN:  1. Abnormal ECG: No change from prior.  No cardiac sx.   No further preoperative w/u or cardiac testing needed prior to neck mass removal surgery.  Prior negative stress test in [place]. 2. Tobacco abuse: needs to quit cigars, that he only started a few years ago  3. Hyperlipidemia:  Needs lipids and liver check.  Continue simvastatin.  He prefers to have his labs checked with PMD.  He will call to get an appt with them and get his refills as well.    Current medicines are reviewed at length with the patient today.  The patient concerns regarding his medicines were addressed.  The following changes have been made:  No change  Labs/ tests ordered today include:  No orders of the defined types were placed in this  encounter.   Recommend 150 minutes/week of aerobic exercise Low fat, low carb, high fiber diet recommended  Disposition:   FU as needed   Signed, Larae Grooms, MD  01/02/2017 8:31 AM    [facility] [place], [place], [place]  [address] Phone: [phone]; Fax: [phone]

## 2017-01-02 ENCOUNTER — Encounter: Payer: Self-pay | Admitting: Interventional Cardiology

## 2017-01-02 ENCOUNTER — Ambulatory Visit (INDEPENDENT_AMBULATORY_CARE_PROVIDER_SITE_OTHER): Payer: BLUE CROSS/BLUE SHIELD | Admitting: Interventional Cardiology

## 2017-01-02 VITALS — BP 110/72 | HR 63 | Ht 72.0 in | Wt 173.0 lb

## 2017-01-02 DIAGNOSIS — R9431 Abnormal electrocardiogram [ECG] [EKG]: Secondary | ICD-10-CM | POA: Diagnosis not present

## 2017-01-02 DIAGNOSIS — E785 Hyperlipidemia, unspecified: Secondary | ICD-10-CM | POA: Diagnosis not present

## 2017-01-02 DIAGNOSIS — Z01810 Encounter for preprocedural cardiovascular examination: Secondary | ICD-10-CM | POA: Diagnosis not present

## 2017-01-02 NOTE — Patient Instructions (Signed)
Medication Instructions:  Your physician recommends that you continue on your current medications as directed. Please refer to the Current Medication list given to you today.   Labwork: None ordered  Testing/Procedures: None ordered  Follow-Up: Your physician wants you to follow-up AS NEEDED  Any Other Special Instructions Will Be Listed Below (If Applicable).     If you need a refill on your cardiac medications before your next appointment, please call your pharmacy.   

## 2017-03-29 ENCOUNTER — Other Ambulatory Visit: Payer: Self-pay | Admitting: Family

## 2017-10-16 ENCOUNTER — Telehealth: Payer: Self-pay | Admitting: Family

## 2017-10-16 NOTE — Telephone Encounter (Signed)
Notified pt. 

## 2017-10-16 NOTE — Telephone Encounter (Signed)
Copied from [place] [phone]. Topic: Quick Communication - Rx Refill/Question >> Oct 16, 2017 11:24 AM Burchel, Abbi R wrote: Medication:  simvastatin (ZOCOR) 40 MG tablet , valACYclovir (VALTREX) 500 MG tablet  Preferred Pharmacy: [facility] [address] [place] [address] Phone: [phone] Fax: [phone]     Pt has appt sched for 10/22/17 8am, but is requesting to have meds refilled until then.

## 2017-10-16 NOTE — Telephone Encounter (Signed)
Melissa -- please advise below request? Pt has not been seen since 2017 even though he has scheduled an appointment for 10/22/17 it doesn't look like we have refilled his medications in > 1 year.

## 2017-10-16 NOTE — Telephone Encounter (Signed)
He will need office visit prior to refills.  Thank you.

## 2017-10-22 ENCOUNTER — Ambulatory Visit: Payer: BLUE CROSS/BLUE SHIELD | Admitting: Family

## 2017-10-24 ENCOUNTER — Encounter: Payer: Self-pay | Admitting: Family

## 2017-10-24 ENCOUNTER — Ambulatory Visit: Payer: BLUE CROSS/BLUE SHIELD | Admitting: Family

## 2017-10-24 VITALS — BP 105/77 | HR 54 | Temp 98.6°F | Resp 16 | Ht 72.0 in | Wt 174.6 lb

## 2017-10-24 DIAGNOSIS — E785 Hyperlipidemia, unspecified: Secondary | ICD-10-CM

## 2017-10-24 DIAGNOSIS — R479 Unspecified speech disturbances: Secondary | ICD-10-CM

## 2017-10-24 DIAGNOSIS — R221 Localized swelling, mass and lump, neck: Secondary | ICD-10-CM

## 2017-10-24 LAB — LIPID PANEL
Cholesterol: 182 mg/dL (ref 0–200)
HDL: 51 mg/dL (ref >39.00)
LDL Cholesterol: 121 mg/dL — ABNORMAL HIGH (ref 0–99)
NonHDL: 130.5
TRIGLYCERIDES: 46 mg/dL (ref 0.0–149.0)
Total CHOL/HDL Ratio: 4
VLDL: 9.2 mg/dL (ref 0.0–40.0)

## 2017-10-24 LAB — T3, FREE: T3 FREE: 3.9 pg/mL (ref 2.3–4.2)

## 2017-10-24 LAB — T4, FREE: FREE T4: 0.75 ng/dL (ref 0.60–1.60)

## 2017-10-24 LAB — TSH: TSH: 1.33 u[IU]/mL (ref 0.35–4.50)

## 2017-10-24 NOTE — Progress Notes (Signed)
Subjective:    Patient ID: Tyrone Washington, male    DOB: 1955/04/23, 62 y.o.   MRN: [id number]  HPI  Tyrone Washington is a 62 yr old male who presents today for follow up.  1) Hyperlipidemia- not currently taking statin  Lab Results  Component Value Date   CHOL 149 11/25/2015   HDL 50.10 11/25/2015   LDLCALC 89 11/25/2015   TRIG 52.0 11/25/2015   CHOLHDL 3 11/25/2015   2) Speech problem- has been present x 1 year.  Reports that it sometimes feels "like I am not getting my words out right."  Tends to happen more at work, "if I am rushing."  Feels like stress and anxiety may play a role. Does not seem to happen when he is at home with friends/ family.    3) neck mass-  Surgery was recommended by ENT in 2017.  He did not follow through with surgery due to cost. He was also concerned about completing in an outpatient surgical center which is where Dr. Laurance Flatten was planning procedure. Notes some constant fullness in his neck.    Review of Systems   See HPI  Past Medical History:  Diagnosis Date  . Allergy    allergic rhinitis  . History of chicken pox   . Hyperlipidemia      Social History   Socioeconomic History  . Marital status: Single    Spouse name: Not on file  . Number of children: 2  . Years of education: Not on file  . Highest education level: Not on file  Occupational History    Employer: [employer]  . Financial resource strain: Not on file  . Food insecurity:    Worry: Not on file    Inability: Not on file  . Transportation needs:    Medical: Not on file    Non-medical: Not on file  Tobacco Use  . Smoking status: Current Every Day Smoker  . Smokeless tobacco: Never Used  . Tobacco comment: Smokes 1 - 2 cigars daily  Substance and Sexual Activity  . Alcohol use: Yes    Alcohol/week: 7.0 standard drinks    Types: 7 Cans of beer per week  . Drug use: Not on file  . Sexual activity: Not on file  Lifestyle  . Physical activity:    Days per week:  Not on file    Minutes per session: Not on file  . Stress: Not on file  Relationships  . Social connections:    Talks on phone: Not on file    Gets together: Not on file    Attends religious service: Not on file    Active member of club or organization: Not on file    Attends meetings of clubs or organizations: Not on file    Relationship status: Not on file  . Intimate partner violence:    Fear of current or ex partner: Not on file    Emotionally abused: Not on file    Physically abused: Not on file    Forced sexual activity: Not on file  Other Topics Concern  . 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  Denies hobbies   No pets.     Past Surgical History:  Procedure Laterality Date  . NO PAST SURGERIES      Family History  Problem Relation Age of Onset  . Heart disease Father        patient is unsure of history  . CVA Father   . Heart attack Neg Hx     No Known Allergies  Current Outpatient Medications on File Prior to Visit  Medication Sig Dispense Refill  . Multiple Vitamin (MULTIVITAMIN) tablet Take 1 tablet by mouth daily.      . valACYclovir (VALTREX) 500 MG tablet Take 1 tablet (500 mg total) by mouth daily. 30 tablet 5   No current facility-administered medications on file prior to visit.     BP 105/77 (BP Location: Right Arm, Cuff Size: Normal)   Pulse (!) 54   Temp 98.6 F (37 C) (Oral)   Resp 16   Ht 6' (1.829 m)   Wt 174 lb 9.6 oz (79.2 kg)   SpO2 100%   BMI 23.68 kg/m        Objective:   Physical Exam  Constitutional: He is oriented to person, place, and time. He appears well-developed and well-nourished. No distress.  HENT:  Head: Normocephalic and atraumatic.  Cardiovascular: Normal  rate and regular rhythm.  No murmur heard. Pulmonary/Chest: Effort normal and breath sounds normal. No respiratory distress. He has no wheezes. He has no rales.  Musculoskeletal: He exhibits no edema.  Neurological: He is alert and oriented to person, place, and time. He exhibits normal muscle tone.  Speech is clear, + facial symmetry  Skin: Skin is warm and dry.  Psychiatric: He has a normal mood and affect. His behavior is normal. Thought content normal.           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  He then had further enlargement of neck mass following this excision and failed to have removal for some time due to lack of insurance. He then obtained insurance but could not afford the up front cost for the planned surgery.  Since I last saw him in 2017 this neck mass has enlarged considerably.  He is now becoming more uncomfortable and is motivated to have it removed.  He would like to see a different ENT.  I have placed this referral and we also obtained TFT's which are WNL.    Hyperlipidemia- not on statin. Follow up lipid panel looks good. OK to remain off of statin.  Lab Results  Component Value Date   CHOL 182 10/24/2017   HDL 51.00 10/24/2017   LDLCALC 121 (H) 10/24/2017   TRIG 46.0 10/24/2017   CHOLHDL 4 10/24/2017   Speech problem- speech is normal here today.  No slurring/studdering.  It only seems occur at work and when he is in a rush. I suspect that this is more of an anxiety related speech issue.  Advised pt to let me know if these symptoms worsen.

## 2017-10-24 NOTE — Patient Instructions (Addendum)
Please complete lab work prior to leaving. You should be contacted about scheduling your appointment with ENT. Please let me know if you have not been contacted in 1 week.

## 2017-10-26 MED ORDER — VALACYCLOVIR HCL 500 MG PO TABS: 500.0000 mg | ORAL_TABLET | Freq: Every day | ORAL | 5 refills | Status: DC

## 2017-10-26 MED FILL — VALACYCLOVIR HCL 500 MG TAB: 500 | 30 days supply | Qty: 30 | Fill #0

## 2017-10-26 NOTE — Addendum Note (Signed)
Addended by: Kelle Darting A on: 10/26/2017 10:48 AM   Modules accepted: Orders

## 2017-11-07 DIAGNOSIS — E079 Disorder of thyroid, unspecified: Secondary | ICD-10-CM | POA: Diagnosis not present

## 2017-11-21 DIAGNOSIS — R221 Localized swelling, mass and lump, neck: Secondary | ICD-10-CM | POA: Diagnosis not present

## 2017-11-21 DIAGNOSIS — E079 Disorder of thyroid, unspecified: Secondary | ICD-10-CM | POA: Diagnosis not present

## 2018-01-03 MED FILL — VALACYCLOVIR HCL 500 MG TAB: 500 | 30 days supply | Qty: 30 | Fill #1

## 2018-04-08 MED FILL — VALACYCLOVIR HCL 500 MG TAB: 500 | 30 days supply | Qty: 30 | Fill #2

## 2018-09-06 MED FILL — VALACYCLOVIR HCL 500 MG TAB: 500 | 30 days supply | Qty: 30 | Fill #3

## 2019-02-26 ENCOUNTER — Ambulatory Visit (INDEPENDENT_AMBULATORY_CARE_PROVIDER_SITE_OTHER): Payer: BC Managed Care – PPO | Admitting: Family

## 2019-02-26 ENCOUNTER — Other Ambulatory Visit (INDEPENDENT_AMBULATORY_CARE_PROVIDER_SITE_OTHER): Payer: BC Managed Care – PPO

## 2019-02-26 ENCOUNTER — Encounter: Payer: Self-pay | Admitting: Family

## 2019-02-26 ENCOUNTER — Other Ambulatory Visit: Payer: Self-pay

## 2019-02-26 ENCOUNTER — Encounter: Payer: Self-pay | Admitting: Gastroenterology

## 2019-02-26 VITALS — BP 118/78 | HR 108 | Temp 96.8°F | Resp 16 | Ht 73.0 in | Wt 168.0 lb

## 2019-02-26 DIAGNOSIS — Z0001 Encounter for general adult medical examination with abnormal findings: Secondary | ICD-10-CM | POA: Diagnosis not present

## 2019-02-26 DIAGNOSIS — R221 Localized swelling, mass and lump, neck: Secondary | ICD-10-CM

## 2019-02-26 DIAGNOSIS — G8929 Other chronic pain: Secondary | ICD-10-CM | POA: Diagnosis not present

## 2019-02-26 DIAGNOSIS — Z23 Encounter for immunization: Secondary | ICD-10-CM | POA: Diagnosis not present

## 2019-02-26 DIAGNOSIS — M25561 Pain in right knee: Secondary | ICD-10-CM | POA: Diagnosis not present

## 2019-02-26 DIAGNOSIS — M25562 Pain in left knee: Secondary | ICD-10-CM

## 2019-02-26 DIAGNOSIS — Z0000 Encounter for general adult medical examination without abnormal findings: Secondary | ICD-10-CM

## 2019-02-26 DIAGNOSIS — R739 Hyperglycemia, unspecified: Secondary | ICD-10-CM

## 2019-02-26 DIAGNOSIS — Z125 Encounter for screening for malignant neoplasm of prostate: Secondary | ICD-10-CM | POA: Diagnosis not present

## 2019-02-26 LAB — BASIC METABOLIC PANEL
BUN: 15 mg/dL (ref 6–23)
CO2: 29 mEq/L (ref 19–32)
Calcium: 9.6 mg/dL (ref 8.4–10.5)
Chloride: 101 mEq/L (ref 96–112)
Creatinine, Ser: 1.16 mg/dL (ref 0.40–1.50)
GFR: 76.41 mL/min (ref >60.00)
Glucose, Bld: 130 mg/dL — ABNORMAL HIGH (ref 70–99)
Potassium: 4.1 mEq/L (ref 3.5–5.1)
Sodium: 137 mEq/L (ref 135–145)

## 2019-02-26 LAB — CBC WITH DIFFERENTIAL/PLATELET
Basophils Absolute: 0 10*3/uL (ref 0.0–0.1)
Basophils Relative: 1.2 % (ref 0.0–3.0)
Eosinophils Absolute: 0.1 10*3/uL (ref 0.0–0.7)
Eosinophils Relative: 3.3 % (ref 0.0–5.0)
HCT: 41.7 % (ref 39.0–52.0)
Hemoglobin: 13.8 g/dL (ref 13.0–17.0)
Lymphocytes Relative: 36.5 % (ref 12.0–46.0)
Lymphs Abs: 1.3 10*3/uL (ref 0.7–4.0)
MCHC: 33.1 g/dL (ref 30.0–36.0)
MCV: 89.3 fl (ref 78.0–100.0)
Monocytes Absolute: 0.3 10*3/uL (ref 0.1–1.0)
Monocytes Relative: 7.7 % (ref 3.0–12.0)
Neutro Abs: 1.8 10*3/uL (ref 1.4–7.7)
Neutrophils Relative %: 51.3 % (ref 43.0–77.0)
Platelets: 306 10*3/uL (ref 150.0–400.0)
RBC: 4.67 Mil/uL (ref 4.22–5.81)
RDW: 14.1 % (ref 11.5–15.5)
WBC: 3.6 10*3/uL — ABNORMAL LOW (ref 4.0–10.5)

## 2019-02-26 LAB — HEPATIC FUNCTION PANEL
ALT: 12 U/L (ref 0–53)
AST: 18 U/L (ref 0–37)
Albumin: 4.2 g/dL (ref 3.5–5.2)
Alkaline Phosphatase: 49 U/L (ref 39–117)
Bilirubin, Direct: 0.1 mg/dL (ref 0.0–0.3)
Total Bilirubin: 0.7 mg/dL (ref 0.2–1.2)
Total Protein: 7 g/dL (ref 6.0–8.3)

## 2019-02-26 LAB — LIPID PANEL
Cholesterol: 247 mg/dL — ABNORMAL HIGH (ref 0–200)
HDL: 48.4 mg/dL (ref >39.00)
LDL Cholesterol: 173 mg/dL — ABNORMAL HIGH (ref 0–99)
NonHDL: 198.53
Total CHOL/HDL Ratio: 5
Triglycerides: 126 mg/dL (ref 0.0–149.0)
VLDL: 25.2 mg/dL (ref 0.0–40.0)

## 2019-02-26 LAB — PSA: PSA: 1.11 ng/mL (ref 0.10–4.00)

## 2019-02-26 LAB — HEMOGLOBIN A1C: Hgb A1c MFr Bld: 5.1 % (ref 4.6–6.5)

## 2019-02-26 LAB — TSH: TSH: 1.7 u[IU]/mL (ref 0.35–4.50)

## 2019-02-26 MED ORDER — MELOXICAM 7.5 MG PO TABS: 7.5000 mg | ORAL_TABLET | Freq: Every day | ORAL | 0 refills | Status: DC

## 2019-02-26 MED FILL — MELOXICAM 7.5 MG TABLET: 7.5 | 14 days supply | Qty: 14 | Fill #0

## 2019-02-26 NOTE — Progress Notes (Signed)
Subjective:    Patient ID: Tyrone Washington, male    DOB: 07-Jun-1955, 63 y.o.   MRN: [id number]  HPI  Patient is a 63 yr old male who presents today with chief complaint of bilateral knee pain.  Reports + swelling of the right knee last week.  Some swelling of the left knee.    Thyroid mass- Pt saw dr Laurance Flatten ENT in 2019 who performed a CT of the neck which noted:    IMPRESSION: Large bilateral anterior neck masses presumably of thyroid origin. No evidence of lymphadenopathy.  He states that he was then referred to Dr. Ilda Foil for excision which he has not yet scheduled.  Immunizations: due for flu shot, declines. Agreeable to pneumovax, tetanus Colonoscopy:  due  Wt Readings from Last 3 Encounters:  02/26/19 168 lb (76.2 kg)  10/24/17 174 lb 9.6 oz (79.2 kg)  01/02/17 173 lb (78.5 kg)     Review of Systems  Constitutional: Negative for unexpected weight change.  HENT: Negative for hearing loss and rhinorrhea.   Eyes: Negative for visual disturbance.  Respiratory: Negative for cough and shortness of breath.   Cardiovascular: Negative for chest pain.  Gastrointestinal: Negative for constipation and diarrhea.  Genitourinary: Negative for dysuria, frequency and hematuria.  Musculoskeletal: Positive for arthralgias.  Skin: Negative for rash.  Neurological: Negative for headaches.  Psychiatric/Behavioral:       Denies depression/anxiety   Past Medical History:  Diagnosis Date  . Allergy    allergic rhinitis  . History of chicken pox   . Hyperlipidemia      Social History   Socioeconomic History  . Marital status: Single    Spouse name: Not on file  . Number of children: 2  . Years of education: Not on file  . Highest education level: Not on file  Occupational History    Employer: [employer]  Tobacco Use  . Smoking status: Current Every Day Smoker  . Smokeless tobacco: Never Used  . Tobacco comment: Smokes 1 - 2 cigars daily  Substance and Sexual Activity  .  Alcohol use: Yes    Alcohol/week: 7.0 standard drinks    Types: 7 Cans of beer per week  . Drug use: Not on file  . Sexual activity: Not on file  Other Topics Concern  . 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     Denies hobbies   No pets.    Social Determinants of Health   Financial Resource Strain:   . Difficulty of Paying Living Expenses: Not on file  Food Insecurity:   . Worried About Charity fundraiser in the Last Year: Not on file  . Ran Out of Food in the Last Year: Not on file  Transportation Needs:   . Lack of Transportation (Medical): Not on file  . Lack of Transportation (Non-Medical): Not on file  Physical Activity:   . Days of Exercise per Week: Not on file  . Minutes of Exercise per Session: Not on file  Stress:   . Feeling of Stress : Not on file  Social Connections:   . Frequency of Communication with Friends and Family: Not on file  . Frequency of  Social Gatherings with Friends and Family: Not on file  . Attends Religious Services: Not on file  . Active Member of Clubs or Organizations: Not on file  . Attends Archivist Meetings: Not on file  . Marital Status: Not on file  Intimate Partner Violence:   . Fear of Current or Ex-Partner: Not on file  . Emotionally Abused: Not on file  . Physically Abused: Not on file  . Sexually Abused: Not on file    Past Surgical History:  Procedure Laterality Date  . NO PAST SURGERIES      Family History  Problem Relation Age of Onset  . Heart disease Father        patient is unsure of history  . CVA Father   . Heart attack Neg Hx     No Known Allergies  Current Outpatient Medications on File Prior to Visit  Medication Sig Dispense Refill  . Multiple  Vitamin (MULTIVITAMIN) tablet Take 1 tablet by mouth daily.      . valACYclovir (VALTREX) 500 MG tablet Take 1 tablet (500 mg total) by mouth daily. 30 tablet 5   No current facility-administered medications on file prior to visit.    BP 118/78 (BP Location: Right Arm, Patient Position: Sitting, Cuff Size: Small)   Pulse (!) 108   Temp (!) 96.8 F (36 C) (Temporal)   Resp 16   Ht 6\' 1"  (1.854 m)   Wt 168 lb (76.2 kg)   SpO2 100%   BMI 22.16 kg/m       Objective:   Physical Exam  Physical Exam  Constitutional: He is oriented to person, place, and time. He appears well-developed and well-nourished. No distress.  HENT:  Head: Normocephalic and atraumatic.  Right Ear: Tympanic membrane and ear canal normal.  Left Ear: Tympanic membrane and ear canal normal.  Mouth/Throat: Oropharynx is clear and moist.  Eyes: Pupils are equal, round, and reactive to light. No scleral icterus.  Neck: Normal range of motion. Large neck mass is again noted Cardiovascular: Normal rate and regular rhythm.   No murmur heard. Pulmonary/Chest: Effort normal and breath sounds normal. No respiratory distress. He has no wheezes. He has no rales. He exhibits no tenderness.  Abdominal: Soft. Bowel sounds are normal. He exhibits no distension and no mass. There is no tenderness. There is no rebound and no guarding.  Musculoskeletal: He exhibits right knee edema and tenderness to palpation Lymphadenopathy:    He has no cervical adenopathy.  Neurological: He is alert and oriented to person, place, and time. He has normal patellar reflexes. He exhibits normal muscle tone. Coordination normal.  Skin: Skin is warm and dry.  Psychiatric: He has a normal mood and affect. His behavior is normal. Judgment and thought content normal.           Assessment & Plan:   Preventative care- will obtain routine lab work. Refer for colo. Immunizations as ordered.   Knee pain- refer to ortho. Trial of meloxicam prn pain.    Goiter- advised pt to schedule follow up with Dr. Ilda Foil for further evaluation.       Assessment & Plan:

## 2019-02-26 NOTE — Patient Instructions (Signed)
Please complete lab work prior to leaving.   

## 2019-03-04 ENCOUNTER — Ambulatory Visit: Payer: Self-pay

## 2019-03-04 ENCOUNTER — Encounter: Payer: Self-pay | Admitting: Orthopaedic Surgery

## 2019-03-04 ENCOUNTER — Other Ambulatory Visit: Payer: Self-pay

## 2019-03-04 ENCOUNTER — Ambulatory Visit (INDEPENDENT_AMBULATORY_CARE_PROVIDER_SITE_OTHER): Payer: BC Managed Care – PPO | Admitting: Orthopaedic Surgery

## 2019-03-04 VITALS — Ht 73.0 in | Wt 168.0 lb

## 2019-03-04 DIAGNOSIS — M25561 Pain in right knee: Secondary | ICD-10-CM | POA: Diagnosis not present

## 2019-03-04 DIAGNOSIS — R221 Localized swelling, mass and lump, neck: Secondary | ICD-10-CM

## 2019-03-04 DIAGNOSIS — M170 Bilateral primary osteoarthritis of knee: Secondary | ICD-10-CM

## 2019-03-04 DIAGNOSIS — M25562 Pain in left knee: Secondary | ICD-10-CM | POA: Diagnosis not present

## 2019-03-04 DIAGNOSIS — G8929 Other chronic pain: Secondary | ICD-10-CM

## 2019-03-04 DIAGNOSIS — M1710 Unilateral primary osteoarthritis, unspecified knee: Secondary | ICD-10-CM | POA: Insufficient documentation

## 2019-03-04 DIAGNOSIS — M179 Osteoarthritis of knee, unspecified: Secondary | ICD-10-CM | POA: Insufficient documentation

## 2019-03-04 MED ORDER — BUPIVACAINE HCL 0.25 % IJ SOLN
2.0000 mL | INTRAMUSCULAR | Status: AC | PRN
  Administered 2019-03-04: 2 mL via INTRA_ARTICULAR

## 2019-03-04 MED ORDER — METHYLPREDNISOLONE ACETATE 40 MG/ML IJ SUSP
80.0000 mg | INTRAMUSCULAR | Status: AC | PRN
  Administered 2019-03-04: 10:00:00 80 mg via INTRA_ARTICULAR

## 2019-03-04 MED ORDER — LIDOCAINE HCL 1 % IJ SOLN
2.0000 mL | INTRAMUSCULAR | Status: AC | PRN
  Administered 2019-03-04: 2 mL

## 2019-03-04 NOTE — Progress Notes (Signed)
Office Visit Note   Patient: Tyrone Washington           Date of Birth: [birth year]           MRN: [id number] Visit Date: 03/04/2019              Requested by: [name], NP [address] [place],  [place] [id number] PCP: [name], NP   Assessment & Plan: Visit Diagnoses:  1. Chronic pain of both knees   2. Primary osteoarthritis of both knees   3. Mass of thyroid region   4. Neck mass     Plan:  #1: Corticosteroid injection was given to the right knee without difficulty.  Tolerated the procedure well. #2: He will return in 2 weeks at that time if he is doing well we will inject the left knee.  If not then he may be a candidate for MRI scan. #3: We are setting up a referral for Dr. Armandina Gemma for evaluation of his thyroid was / neck and you can see where they operated on  mass.   Follow-Up Instructions: Return in about 2 weeks (around 03/18/2019).   Orders:  Orders Placed This Encounter  Procedures  . Large Joint Inj: R knee  . XR KNEE 3 VIEW LEFT  . XR KNEE 3 VIEW RIGHT  . Ambulatory referral to General Surgery   No orders of the defined types were placed in this encounter.     Procedures: Large Joint Inj: R knee on 03/04/2019 10:19 AM Indications: pain and diagnostic evaluation Details: 25 G 1.5 in needle, anteromedial approach  Arthrogram: No  Medications: 2 mL lidocaine 1 %; 80 mg methylPREDNISolone acetate 40 MG/ML; 2 mL bupivacaine 0.25 % Procedure, treatment alternatives, risks and benefits explained, specific risks discussed. Consent was given by the patient. Immediately prior to procedure a time out was called to verify the correct patient, procedure, equipment, support staff and site/side marked as required. Patient was prepped and draped in the usual sterile fashion.       Clinical Data: No additional findings.   Subjective: Chief Complaint  Patient presents with  . Right Knee - Pain  . Left Knee - Pain  Patient  presents today for bilateral knee pain. He said that they started hurting two months ago, with no know injury. Initially the ride side was worse, but now they hurt about the same. He said that they hurt all over and swell. He does not take anything for pain. No grinding. He said that they do feel like they will give way. No previous knee surgery.   Tyrone Washington is a 63 year old African-American male who presents today with a several month history of pain in both knees right worse than left.  He denies any history of injury or trauma recently.  He did play football as a young man.  He complains of pain all over and at times swelling of both knees.  He does feel as if the have some sensation of giving way.  Denies any hip and groin pain.  He does still complain of swelling in diffusely about both knees at times.  HPI  Review of Systems  Constitutional: Negative for fatigue.  HENT: Negative for ear pain.   Eyes: Negative for pain.  Respiratory: Negative for shortness of breath.   Cardiovascular: Negative for leg swelling.  Gastrointestinal: Negative for constipation and diarrhea.  Endocrine: Negative for cold intolerance and heat intolerance.  Genitourinary: Negative for difficulty  urinating.  Musculoskeletal: Positive for joint swelling.  Skin: Negative for rash.  Allergic/Immunologic: Negative for food allergies.  Neurological: Negative for weakness.  Hematological: Does not bruise/bleed easily.  Psychiatric/Behavioral: Negative for sleep disturbance.     Objective: Vital Signs: Ht 6\' 1"  (1.854 m)   Wt 168 lb (76.2 kg)   BMI 22.16 kg/m   Physical Exam Constitutional:      Appearance: He is well-developed.  Eyes:     Pupils: Pupils are equal, round, and reactive to light.  Pulmonary:     Effort: Pulmonary effort is normal.  Skin:    General: Skin is warm and dry.  Neurological:     Mental Status: He is alert and oriented to person, place, and time.  Psychiatric:        Behavior:  Behavior normal.     Ortho Exam  Exam today reveals trace effusion bilaterally.  Skin intact.  No ecchymosis.  He has near full extension.  Flexes to around 100degrees easily.  Varus and valgus stressing are equal and normal.  He does not have an anterior drawer bilaterally.  Does have a little bit of patellofemoral crepitance with range of motion.  Not particularly painful to palpation about the joint lines.  Calf is supple nontender.  He is not particularly painful about the joint lines at this time.  Specialty Comments:  No specialty comments available.  Imaging: No results found.   PMFS History: Patient Active Problem List   Diagnosis Date Noted  . 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 Preventative health care 07/16/2015  . Nonspecific abnormal electrocardiogram (ECG) (EKG) 07/16/2015  . Migraine headache 11/10/2013  . WEIGHT LOSS 05/27/2010  . Goiter 01/14/2009  . 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 GENITAL HERPES 12/15/2008  . Hyperlipidemia 12/15/2008  . ALLERGIC RHINITIS 12/15/2008  . Neck mass 12/15/2008   Past Medical History:  Diagnosis Date  . Allergy    allergic rhinitis  . History of chicken pox   . Hyperlipidemia     Family History  Problem Relation Age of Onset  . Heart disease Father        patient is unsure of history  . CVA Father   . Heart attack Neg Hx     Past Surgical History:  Procedure Laterality Date  . NO PAST SURGERIES     Social History   Occupational History    Employer: [employer]  Tobacco Use  . Smoking status: Current Every Day Smoker  . Smokeless tobacco: Never Used  . Tobacco comment: Smokes 1 - 2 cigars daily  Substance and Sexual Activity  . Alcohol use: Yes    Alcohol/week: 7.0 standard drinks    Types: 7 Cans of beer per week  . Drug use: Not on file  . Sexual activity: Not on file

## 2019-03-09 ENCOUNTER — Encounter: Payer: Self-pay | Admitting: Family

## 2019-03-19 ENCOUNTER — Ambulatory Visit (INDEPENDENT_AMBULATORY_CARE_PROVIDER_SITE_OTHER): Payer: BC Managed Care – PPO | Admitting: Orthopaedic Surgery

## 2019-03-19 ENCOUNTER — Encounter: Payer: Self-pay | Admitting: Orthopaedic Surgery

## 2019-03-19 ENCOUNTER — Telehealth: Payer: Self-pay

## 2019-03-19 ENCOUNTER — Other Ambulatory Visit: Payer: Self-pay

## 2019-03-19 ENCOUNTER — Telehealth: Payer: Self-pay | Admitting: Radiology

## 2019-03-19 VITALS — Ht 73.0 in | Wt 168.0 lb

## 2019-03-19 DIAGNOSIS — M170 Bilateral primary osteoarthritis of knee: Secondary | ICD-10-CM

## 2019-03-19 DIAGNOSIS — M25561 Pain in right knee: Secondary | ICD-10-CM

## 2019-03-19 DIAGNOSIS — M1711 Unilateral primary osteoarthritis, right knee: Secondary | ICD-10-CM

## 2019-03-19 DIAGNOSIS — M1712 Unilateral primary osteoarthritis, left knee: Secondary | ICD-10-CM

## 2019-03-19 MED ORDER — BUPIVACAINE HCL 0.5 % IJ SOLN
2.0000 mL | INTRAMUSCULAR | Status: AC | PRN
  Administered 2019-03-19: 2 mL via INTRA_ARTICULAR

## 2019-03-19 MED ORDER — METHYLPREDNISOLONE ACETATE 40 MG/ML IJ SUSP
80.0000 mg | INTRAMUSCULAR | Status: AC | PRN
  Administered 2019-03-19: 80 mg via INTRA_ARTICULAR

## 2019-03-19 MED ORDER — LIDOCAINE HCL 1 % IJ SOLN
2.0000 mL | INTRAMUSCULAR | Status: AC | PRN
  Administered 2019-03-19: 2 mL

## 2019-03-19 NOTE — Telephone Encounter (Signed)
Spoke with patient. Work note will be up front for patient to pickup.

## 2019-03-19 NOTE — Telephone Encounter (Signed)
Patient returned call concerning work note.  Patient stated that he would be standing 8-10hrs, so he is not able to perform light duty and would like out of work note.  Cb# [phone].  Please advise.  Thank you.

## 2019-03-19 NOTE — Telephone Encounter (Signed)
Left voicemail. Patient is to return call.

## 2019-03-19 NOTE — Addendum Note (Signed)
Addended by: Michae Kava B on: 03/19/2019 10:52 AM   Modules accepted: Orders

## 2019-03-19 NOTE — Telephone Encounter (Signed)
Patient requests work note. He does not believe he can stand and work 10-12hrs a day. Patient mention getting paperwork from HR to start short term disability.  Please advise.

## 2019-03-19 NOTE — Telephone Encounter (Signed)
[place] for work note-call and ask if he could perform light duty with minimal standing. If not then out of work until further notice pending evaluation

## 2019-03-19 NOTE — Progress Notes (Signed)
Office Visit Note   Patient: Tyrone Washington           Date of Birth: 10/22/1955           MRN: [id number] Visit Date: 03/19/2019              Requested by: [name], NP [address] [place],  [place] [address] PCP: [name], NP   Assessment & Plan: Visit Diagnoses:  1. Primary osteoarthritis of both knees     Plan: Cortisone injection left knee.  MRI scan right knee and return thereafter  Follow-Up Instructions: Return After MRI scan right knee.   Orders:  Orders Placed This Encounter  Procedures  . Large Joint Inj: L knee   No orders of the defined types were placed in this encounter.     Procedures: Large Joint Inj: L knee on 03/19/2019 10:44 AM Indications: pain and diagnostic evaluation Details: 25 G 1.5 in needle, anteromedial approach  Arthrogram: No  Medications: 2 mL lidocaine 1 %; 2 mL bupivacaine 0.5 %; 80 mg methylPREDNISolone acetate 40 MG/ML Procedure, treatment alternatives, risks and benefits explained, specific risks discussed. Consent was given by the patient. Patient was prepped and draped in the usual sterile fashion.       Clinical Data: No additional findings.   Subjective: Chief Complaint  Patient presents with  . Left Knee - Pain, Injections  . Right Knee - Follow-up    Right knee injection 03/04/2019  Patient is here today for bilateral knees. Right knee was injected on 03/04/2019. Patient is returning today for left knee injection.  Patient states right knee is about 50% better since injection. Films performed on December 22 demonstrated mild medial joint space narrowing of both knees.  No ectopic calcification or acute changes.  Films were consistent with arthritis and and, thus, reason for injections  Left knee lateral pain. Patient complains of left knee feeling very achy and stiff, with sharp pains at times. Most pain is while walking or if he is trying to bend down and pick something up.   Knees will feel like they are giving way, off balance. Patient has not taken any medication for pain. Has not received notification of his appointment for his thyroid problem.  Will check into that  HPI  Review of Systems   Objective: Vital Signs: Ht 6\' 1"  (1.854 m)   Wt 168 lb (76.2 kg)   BMI 22.16 kg/m   Physical Exam Constitutional:      Appearance: He is well-developed.  Eyes:     Pupils: Pupils are equal, round, and reactive to light.  Pulmonary:     Effort: Pulmonary effort is normal.  Skin:    General: Skin is warm and dry.  Neurological:     Mental Status: He is alert and oriented to person, place, and time.  Psychiatric:        Behavior: Behavior normal.     Ortho Exam left knee was not hot red warm or swollen.  Right knee was also benign.  Some mild medial joint tenderness bilaterally.  Comparable flexion and extension both knees  Specialty Comments:  No specialty comments available.  Imaging: No results found.   PMFS History: Patient Active Problem List   Diagnosis Date Noted  . 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 Preventative health care 07/16/2015  . Nonspecific abnormal electrocardiogram (ECG) (EKG) 07/16/2015  . Migraine headache 11/10/2013  . WEIGHT LOSS 05/27/2010  .  Goiter 01/14/2009  . 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 GENITAL HERPES 12/15/2008  . Hyperlipidemia 12/15/2008  . ALLERGIC RHINITIS 12/15/2008  . Neck mass 12/15/2008   Past Medical History:  Diagnosis Date  . Allergy    allergic rhinitis  . History of chicken pox   . Hyperlipidemia     Family History  Problem Relation Age of Onset  . Heart disease Father        patient is unsure of history  . CVA Father   . Heart attack Neg Hx     Past Surgical History:  Procedure Laterality Date  . NO PAST SURGERIES     Social History   Occupational History    Employer: [employer]  Tobacco Use  . Smoking status: Current Every Day Smoker  . Smokeless  tobacco: Never Used  . Tobacco comment: Smokes 1 - 2 cigars daily  Substance and Sexual Activity  . Alcohol use: Yes    Alcohol/week: 7.0 standard drinks    Types: 7 Cans of beer per week  . Drug use: Not on file  . Sexual activity: Not on file

## 2019-03-20 ENCOUNTER — Other Ambulatory Visit: Payer: BC Managed Care – PPO

## 2019-03-21 ENCOUNTER — Encounter: Payer: Self-pay | Admitting: Orthopaedic Surgery

## 2019-03-25 ENCOUNTER — Ambulatory Visit: Payer: BC Managed Care – PPO

## 2019-03-25 ENCOUNTER — Telehealth: Payer: Self-pay | Admitting: Orthopaedic Surgery

## 2019-03-25 ENCOUNTER — Other Ambulatory Visit: Payer: Self-pay

## 2019-03-25 ENCOUNTER — Encounter: Payer: Self-pay | Admitting: *Deleted

## 2019-03-25 VITALS — Temp 97.5°F | Ht 72.0 in | Wt 164.0 lb

## 2019-03-25 NOTE — Telephone Encounter (Signed)
Called patient left voicemail message to return call to schedule an appointment for MRI review with Dr. Durward Fortes

## 2019-03-27 ENCOUNTER — Other Ambulatory Visit: Payer: BC Managed Care – PPO

## 2019-03-30 ENCOUNTER — Ambulatory Visit
Admission: RE | Admit: 2019-03-30 | Discharge: 2019-03-30 | Disposition: A | Discharge status: Home / Self Care | Payer: BC Managed Care – PPO | Source: Ambulatory Visit | Attending: Orthopaedic Surgery | Admitting: Orthopaedic Surgery

## 2019-03-30 ENCOUNTER — Other Ambulatory Visit: Payer: Self-pay

## 2019-03-30 DIAGNOSIS — S83231A Complex tear of medial meniscus, current injury, right knee, initial encounter: Secondary | ICD-10-CM | POA: Diagnosis not present

## 2019-03-30 DIAGNOSIS — M25561 Pain in right knee: Secondary | ICD-10-CM

## 2019-04-01 ENCOUNTER — Encounter: Payer: Self-pay | Admitting: Orthopaedic Surgery

## 2019-04-01 ENCOUNTER — Other Ambulatory Visit: Payer: Self-pay

## 2019-04-01 ENCOUNTER — Ambulatory Visit (INDEPENDENT_AMBULATORY_CARE_PROVIDER_SITE_OTHER): Payer: BC Managed Care – PPO | Admitting: Orthopaedic Surgery

## 2019-04-01 VITALS — Ht 72.0 in | Wt 164.0 lb

## 2019-04-01 DIAGNOSIS — M170 Bilateral primary osteoarthritis of knee: Secondary | ICD-10-CM | POA: Diagnosis not present

## 2019-04-01 NOTE — Progress Notes (Signed)
Office Visit Note   Patient: Tyrone Washington           Date of Birth: May 22, 1955           MRN: [id number] Visit Date: 04/01/2019              Requested by: [name], NP [address] [place],  [place] [address] PCP: [name], NP   Assessment & Plan: Visit Diagnoses:  1. Primary osteoarthritis of both knees     Plan: [name] had an MRI scan of his right knee demonstrating a complex tear of the posterior horn and midportion of the medial meniscus.  He also has degenerative changes in all 3 compartments.  I long discussion with him over 30 minutes regarding the findings.  He is already had cortisone injection without much relief.  I suspect the large majority of his pain is related to the meniscus.  I discussed the next step is knee arthroscopy knowing full well that may only provide partial relief of his pain because of the arthritic changes but I do not think he is a candidate for total knee replacement as yet based on the x-rays and MRI scan findings.  He also is having some symptoms on the left.  More importantly he has had significantly enlarged thyroid gland and has an appointment at the end of the month to see the general surgeon.  I think we need to wait until after that evaluation before we consider any surgery.  In the meantime I will give him a application for handicap parking sticker.  He notes that he is unable to work as he does not have good balance and is on his feet a good part of the time.  Part of that might be related to his knees and partly from the thyroid issue.  We will give him a note saying that he cannot work until further notice.  I plan to see him back in a month  Follow-Up Instructions: Return in about 1 month (around 05/02/2019).   Orders:  No orders of the defined types were placed in this encounter.  No orders of the defined types were placed in this encounter.     Procedures: No procedures performed   Clinical  Data: No additional findings.   Subjective: Chief Complaint  Patient presents with  . Right Knee - Follow-up    MRI results of right knee  Patient presents today for follow up on his right knee. He had an MRI of his right knee on 03/30/2019 and is here today to go over those results. No changes since his last visit.  HPI  Review of Systems   Objective: Vital Signs: Ht 6' (1.829 m)   Wt 164 lb (74.4 kg)   BMI 22.24 kg/m   Physical Exam Constitutional:      Appearance: He is well-developed.  Eyes:     Pupils: Pupils are equal, round, and reactive to light.  Pulmonary:     Effort: Pulmonary effort is normal.  Skin:    General: Skin is warm and dry.  Neurological:     Mental Status: He is alert and oriented to person, place, and time.  Psychiatric:        Behavior: Behavior normal.     Ortho Exam awake alert and oriented x3.  Comfortable sitting as previously identified has a significantly enlarged thyroid.  He did have some tenderness on the medial aspect of his right knee without  an effusion.  He had full extension of flexion some patellar crepitation.  I did not feel a popliteal cyst or mass and there was no calf pain.  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  Specialty Comments:  No specialty comments available.  Imaging: No results found.   PMFS History: Patient Active Problem List   Diagnosis Date Noted  . 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 Preventative health care 07/16/2015  . Nonspecific abnormal electrocardiogram (ECG) (EKG) 07/16/2015  . Migraine headache 11/10/2013  . WEIGHT LOSS 05/27/2010  . Goiter 01/14/2009  . 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 GENITAL HERPES 12/15/2008  . Hyperlipidemia 12/15/2008  . ALLERGIC RHINITIS 12/15/2008  . Neck mass 12/15/2008   Past Medical History:    Diagnosis Date  . Allergy    allergic rhinitis  . Arthritis   . History of chicken pox   . Hyperlipidemia    no meds now    Family History  Problem Relation Age of Onset  . Heart disease Father        patient is unsure of history  . CVA Father   . Heart attack Neg Hx   . Colon cancer Neg Hx   . Esophageal cancer Neg Hx   . Rectal cancer Neg Hx   . Stomach cancer Neg Hx     Past Surgical History:  Procedure Laterality Date  . COLONOSCOPY    . THYROID SURGERY     "Years ago"   Social History   Occupational History    Employer: [employer]  Tobacco Use  . Smoking status: Current Every Day Smoker  . Smokeless tobacco: Never Used  . Tobacco comment: Smokes 1 - 2 cigars daily  Substance and Sexual Activity  . Alcohol use: Yes    Alcohol/week: 7.0 standard drinks    Types: 7 Cans of beer per week  . Drug use: Never  . Sexual activity: Not on file

## 2019-04-01 NOTE — Telephone Encounter (Signed)
Patient needs a Referral to a foot Dr. Please and thanks.

## 2019-04-03 NOTE — Telephone Encounter (Signed)
Called patient to get some more information on his symptoms, complains of swelling of his feet with numbness and without pain. This ison and off, I advised him it will be best if he comes in for evaluation. Appointment was scheduled for Tuesday 04-08-2019.

## 2019-04-04 DIAGNOSIS — D440 Neoplasm of uncertain behavior of thyroid gland: Secondary | ICD-10-CM | POA: Diagnosis not present

## 2019-04-04 DIAGNOSIS — E042 Nontoxic multinodular goiter: Secondary | ICD-10-CM | POA: Diagnosis not present

## 2019-04-08 ENCOUNTER — Encounter: Payer: Self-pay | Admitting: Neurology

## 2019-04-08 ENCOUNTER — Encounter: Payer: BC Managed Care – PPO | Admitting: Gastroenterology

## 2019-04-08 ENCOUNTER — Other Ambulatory Visit: Payer: Self-pay

## 2019-04-08 ENCOUNTER — Encounter: Payer: Self-pay | Admitting: Family

## 2019-04-08 ENCOUNTER — Ambulatory Visit (INDEPENDENT_AMBULATORY_CARE_PROVIDER_SITE_OTHER): Payer: BC Managed Care – PPO | Admitting: Family

## 2019-04-08 VITALS — BP 108/71 | HR 84 | Temp 96.5°F | Resp 16 | Ht 73.0 in | Wt 161.0 lb

## 2019-04-08 DIAGNOSIS — R202 Paresthesia of skin: Secondary | ICD-10-CM | POA: Diagnosis not present

## 2019-04-08 MED ORDER — VALACYCLOVIR HCL 500 MG PO TABS: 500.0000 mg | ORAL_TABLET | Freq: Every day | ORAL | 5 refills | Status: AC

## 2019-04-08 MED FILL — VALACYCLOVIR HCL 500 MG TAB: 500 | 30 days supply | Qty: 30 | Fill #0

## 2019-04-08 NOTE — Patient Instructions (Signed)
Please complete lab work prior to leaving. You should be contacted about your referral to Neurology.

## 2019-04-08 NOTE — Progress Notes (Signed)
Subjective:    Patient ID: Tyrone Washington, male    DOB: April 18, 1955, 64 y.o.   MRN: [id number]  HPI   Patient is a 64 yr old male who presents today with chief complaint of foot swelling and numbness. Reports difficulty walking as a result. Reports numbness has been present since October. He is working with orthopedics for some chronic knee pain. Denies any chronic back pain issues.    Review of Systems    see HPI  Past Medical History:  Diagnosis Date  . Allergy    allergic rhinitis  . Arthritis   . History of chicken pox   . Hyperlipidemia    no meds now     Social History   Socioeconomic History  . Marital status: Single    Spouse name: Not on file  . Number of children: 2  . Years of education: Not on file  . Highest education level: Not on file  Occupational History    Employer: [employer]  Tobacco Use  . Smoking status: Current Every Day Smoker  . Smokeless tobacco: Never Used  . Tobacco comment: Smokes 1 - 2 cigars daily  Substance and Sexual Activity  . Alcohol use: Yes    Alcohol/week: 7.0 standard drinks    Types: 7 Cans of beer per week  . Drug use: Never  . Sexual activity: Not on file  Other Topics Concern  . 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     Denies hobbies   No pets.    Social Determinants of Health   Financial Resource Strain:   . Difficulty of Paying Living Expenses: Not on file  Food Insecurity:   . Worried About Charity fundraiser in the Last Year: Not on file  . Ran Out of Food in the Last Year: Not on file  Transportation Needs:   . Lack of Transportation (Medical): Not on file  . Lack of Transportation (Non-Medical): Not on file  Physical Activity:   . Days of Exercise per  Week: Not on file  . Minutes of Exercise per Session: Not on file  Stress:   . Feeling of Stress : Not on file  Social Connections:   . Frequency of Communication with Friends and Family: Not on file  . Frequency of Social Gatherings with Friends and Family: Not on file  . Attends Religious Services: Not on file  . Active Member of Clubs or Organizations: Not on file  . Attends Archivist Meetings: Not on file  . Marital Status: Not on file  Intimate Partner Violence:   . Fear of Current or Ex-Partner: Not on file  . Emotionally Abused: Not on file  . Physically Abused: Not on file  . Sexually Abused: Not on file    Past Surgical History:  Procedure Laterality Date  . COLONOSCOPY    . THYROID SURGERY     "Years ago"    Family History  Problem Relation Age of Onset  . Heart disease Father        patient is unsure of history  . CVA Father   . Heart attack Neg Hx   . Colon cancer Neg Hx   .  Esophageal cancer Neg Hx   . Rectal cancer Neg Hx   . Stomach cancer Neg Hx     No Known Allergies  Current Outpatient Medications on File Prior to Visit  Medication Sig Dispense Refill  . meloxicam (MOBIC) 7.5 MG tablet Take 1 tablet (7.5 mg total) by mouth daily. 14 tablet 0  . Multiple Vitamin (MULTIVITAMIN) tablet Take 1 tablet by mouth daily.      . valACYclovir (VALTREX) 500 MG tablet Take 1 tablet (500 mg total) by mouth daily. 30 tablet 5   No current facility-administered medications on file prior to visit.    BP 108/71 (BP Location: Right Arm, Patient Position: Sitting, Cuff Size: Small)   Pulse 84   Temp (!) 96.5 F (35.8 C) (Oral)   Resp 16   Ht 6\' 1"  (1.854 m)   Wt 161 lb (73 kg)   SpO2 100%   BMI 21.24 kg/m     Objective:   Physical Exam Constitutional:      General: He is not in acute distress.    Appearance: He is well-developed.  HENT:     Head: Normocephalic and atraumatic.  Cardiovascular:     Rate and Rhythm: Normal rate and regular  rhythm.     Pulses:          Dorsalis pedis pulses are 2+ on the right side and 2+ on the left side.       Posterior tibial pulses are 2+ on the right side and 2+ on the left side.     Heart sounds: No murmur.  Pulmonary:     Effort: Pulmonary effort is normal. No respiratory distress.     Breath sounds: Normal breath sounds. No wheezing or rales.  Musculoskeletal:     Right lower leg: No edema.     Left lower leg: No edema.  Skin:    General: Skin is warm and dry.  Neurological:     Mental Status: He is alert and oriented to person, place, and time.     Comments: Bilateral LE strength is 5/5 Dorsal and plantar surfaces are sensitive to monofilament   Psychiatric:        Behavior: Behavior normal.        Thought Content: Thought content normal.           Assessment & Plan:  Paresthesia- new. Check b12, folate, TSH.  Will refer to neurology for further evaluation.

## 2019-04-09 LAB — B12 AND FOLATE PANEL
Folate: 9.3 ng/mL (ref >5.9)
Vitamin B-12: 287 pg/mL (ref 211–911)

## 2019-04-09 LAB — TSH: TSH: 1.93 u[IU]/mL (ref 0.35–4.50)

## 2019-04-10 ENCOUNTER — Telehealth: Payer: Self-pay | Admitting: Family

## 2019-04-10 ENCOUNTER — Encounter: Payer: Self-pay | Admitting: Family

## 2019-04-10 DIAGNOSIS — E538 Deficiency of other specified B group vitamins: Secondary | ICD-10-CM

## 2019-04-10 HISTORY — DX: Deficiency of other specified B group vitamins: E53.8

## 2019-04-10 NOTE — Telephone Encounter (Signed)
Please let pt know that his b12 is on the low side. I think this may be causing his foot numbness. I would like him to start b12 injections 1061mcg IM once weekly x 4 weeks, then once monthly.

## 2019-04-11 NOTE — Telephone Encounter (Signed)
Patient advised of results and scheduled for first injection 2-02-20221

## 2019-04-14 DIAGNOSIS — E049 Nontoxic goiter, unspecified: Secondary | ICD-10-CM | POA: Diagnosis not present

## 2019-04-15 ENCOUNTER — Ambulatory Visit (INDEPENDENT_AMBULATORY_CARE_PROVIDER_SITE_OTHER): Payer: BC Managed Care – PPO

## 2019-04-15 ENCOUNTER — Other Ambulatory Visit: Payer: Self-pay

## 2019-04-15 DIAGNOSIS — E538 Deficiency of other specified B group vitamins: Secondary | ICD-10-CM | POA: Diagnosis not present

## 2019-04-15 MED ORDER — CYANOCOBALAMIN 1000 MCG/ML IJ SOLN
1000.0000 ug | Freq: Once | INTRAMUSCULAR | Status: AC
  Administered 2019-04-15: 1000 ug via INTRAMUSCULAR

## 2019-04-15 NOTE — Progress Notes (Signed)
Pt here for WEEKLY B12 injection per [name]  B12 1017mcg given IM, and pt tolerated injection well.  Next B12 injection scheduled for the rest of this month weekly.

## 2019-04-15 NOTE — Progress Notes (Signed)
Reviewed. ° ° °Lorence Nagengast S O'Sullivan NP °

## 2019-04-22 ENCOUNTER — Other Ambulatory Visit: Payer: Self-pay

## 2019-04-22 ENCOUNTER — Ambulatory Visit: Payer: BC Managed Care – PPO

## 2019-04-22 ENCOUNTER — Ambulatory Visit (INDEPENDENT_AMBULATORY_CARE_PROVIDER_SITE_OTHER): Payer: BC Managed Care – PPO

## 2019-04-22 DIAGNOSIS — E538 Deficiency of other specified B group vitamins: Secondary | ICD-10-CM | POA: Diagnosis not present

## 2019-04-22 MED ORDER — CYANOCOBALAMIN 1000 MCG/ML IJ SOLN
1000.0000 ug | Freq: Once | INTRAMUSCULAR | Status: AC
  Administered 2019-04-22: 1000 ug via INTRAMUSCULAR

## 2019-04-22 NOTE — Progress Notes (Addendum)
Pt here for monthly B12 injection per  [name]  B12 1086mcg given IM, and pt tolerated injection well.  Next B12 injection scheduled for next week  Above CMA note reviewed.  Nance Pear NP

## 2019-04-25 DIAGNOSIS — E049 Nontoxic goiter, unspecified: Secondary | ICD-10-CM | POA: Diagnosis not present

## 2019-04-25 DIAGNOSIS — R221 Localized swelling, mass and lump, neck: Secondary | ICD-10-CM | POA: Diagnosis not present

## 2019-04-25 DIAGNOSIS — F17290 Nicotine dependence, other tobacco product, uncomplicated: Secondary | ICD-10-CM | POA: Diagnosis not present

## 2019-04-29 ENCOUNTER — Other Ambulatory Visit: Payer: Self-pay

## 2019-04-29 ENCOUNTER — Ambulatory Visit (INDEPENDENT_AMBULATORY_CARE_PROVIDER_SITE_OTHER): Payer: BC Managed Care – PPO

## 2019-04-29 DIAGNOSIS — E538 Deficiency of other specified B group vitamins: Secondary | ICD-10-CM

## 2019-04-29 MED ORDER — CYANOCOBALAMIN 1000 MCG/ML IJ SOLN
1000.0000 ug | Freq: Once | INTRAMUSCULAR | Status: AC
  Administered 2019-04-29: 1000 ug via INTRAMUSCULAR

## 2019-04-29 NOTE — Progress Notes (Signed)
Pt here for weekly B12 injection per  [name]  B12 1077mcg given IM, in left deltoid and pt tolerated injection well.  Next B12 injection scheduled for next week .

## 2019-04-30 NOTE — Progress Notes (Signed)
Not reviewed and agree.   Nance Pear NP

## 2019-05-06 ENCOUNTER — Other Ambulatory Visit: Payer: Self-pay

## 2019-05-06 ENCOUNTER — Ambulatory Visit (INDEPENDENT_AMBULATORY_CARE_PROVIDER_SITE_OTHER): Payer: BC Managed Care – PPO

## 2019-05-06 DIAGNOSIS — E538 Deficiency of other specified B group vitamins: Secondary | ICD-10-CM

## 2019-05-06 DIAGNOSIS — E049 Nontoxic goiter, unspecified: Secondary | ICD-10-CM | POA: Diagnosis not present

## 2019-05-06 DIAGNOSIS — R221 Localized swelling, mass and lump, neck: Secondary | ICD-10-CM | POA: Diagnosis not present

## 2019-05-06 MED ORDER — CYANOCOBALAMIN 1000 MCG/ML IJ SOLN
1000.0000 ug | Freq: Once | INTRAMUSCULAR | Status: AC
  Administered 2019-05-06: 1000 ug via INTRAMUSCULAR

## 2019-05-06 NOTE — Progress Notes (Signed)
CMA note reviewed.  Carrera Kiesel S O'Sullivan NP 

## 2019-05-06 NOTE — Progress Notes (Signed)
Pt here for monthly B12 injection per  [name]  B12 101mcg given IM in left deltoid, and pt tolerated injection well. Lat weekly injection.  Next B12 injection scheduled for next month

## 2019-05-12 ENCOUNTER — Telehealth: Payer: Self-pay | Admitting: Family

## 2019-05-12 ENCOUNTER — Other Ambulatory Visit: Payer: Self-pay

## 2019-05-12 ENCOUNTER — Inpatient Hospital Stay (HOSPITAL_COMMUNITY)
Admission: EM | Admit: 2019-05-12 | Discharge: 2019-05-17 | DRG: 518 | Disposition: A | Discharge status: Rehabilitation Facility | Payer: BC Managed Care – PPO | Source: Ambulatory Visit | Attending: Neurosurgery | Admitting: Neurosurgery

## 2019-05-12 ENCOUNTER — Encounter: Payer: Self-pay | Admitting: Neurology

## 2019-05-12 ENCOUNTER — Ambulatory Visit (INDEPENDENT_AMBULATORY_CARE_PROVIDER_SITE_OTHER): Payer: BC Managed Care – PPO | Admitting: Neurology

## 2019-05-12 ENCOUNTER — Emergency Department (HOSPITAL_COMMUNITY): Payer: BC Managed Care – PPO

## 2019-05-12 ENCOUNTER — Encounter (HOSPITAL_COMMUNITY): Payer: Self-pay | Admitting: Emergency Medicine

## 2019-05-12 VITALS — BP 103/68 | HR 84 | Resp 18 | Ht 72.0 in | Wt 160.0 lb

## 2019-05-12 DIAGNOSIS — R200 Anesthesia of skin: Secondary | ICD-10-CM

## 2019-05-12 DIAGNOSIS — E785 Hyperlipidemia, unspecified: Secondary | ICD-10-CM | POA: Diagnosis not present

## 2019-05-12 DIAGNOSIS — R739 Hyperglycemia, unspecified: Secondary | ICD-10-CM | POA: Diagnosis not present

## 2019-05-12 DIAGNOSIS — G9529 Other cord compression: Secondary | ICD-10-CM | POA: Diagnosis present

## 2019-05-12 DIAGNOSIS — Z981 Arthrodesis status: Secondary | ICD-10-CM | POA: Diagnosis not present

## 2019-05-12 DIAGNOSIS — Z9181 History of falling: Secondary | ICD-10-CM

## 2019-05-12 DIAGNOSIS — K5900 Constipation, unspecified: Secondary | ICD-10-CM | POA: Diagnosis not present

## 2019-05-12 DIAGNOSIS — M4716 Other spondylosis with myelopathy, lumbar region: Secondary | ICD-10-CM | POA: Diagnosis not present

## 2019-05-12 DIAGNOSIS — Z79899 Other long term (current) drug therapy: Secondary | ICD-10-CM | POA: Diagnosis not present

## 2019-05-12 DIAGNOSIS — M4804 Spinal stenosis, thoracic region: Secondary | ICD-10-CM | POA: Diagnosis not present

## 2019-05-12 DIAGNOSIS — Z20822 Contact with and (suspected) exposure to covid-19: Secondary | ICD-10-CM | POA: Diagnosis not present

## 2019-05-12 DIAGNOSIS — Z8249 Family history of ischemic heart disease and other diseases of the circulatory system: Secondary | ICD-10-CM

## 2019-05-12 DIAGNOSIS — M47812 Spondylosis without myelopathy or radiculopathy, cervical region: Secondary | ICD-10-CM | POA: Diagnosis not present

## 2019-05-12 DIAGNOSIS — C7949 Secondary malignant neoplasm of other parts of nervous system: Secondary | ICD-10-CM | POA: Diagnosis present

## 2019-05-12 DIAGNOSIS — Z9089 Acquired absence of other organs: Secondary | ICD-10-CM

## 2019-05-12 DIAGNOSIS — M5117 Intervertebral disc disorders with radiculopathy, lumbosacral region: Secondary | ICD-10-CM | POA: Diagnosis not present

## 2019-05-12 DIAGNOSIS — C7951 Secondary malignant neoplasm of bone: Secondary | ICD-10-CM | POA: Diagnosis not present

## 2019-05-12 DIAGNOSIS — D72829 Elevated white blood cell count, unspecified: Secondary | ICD-10-CM | POA: Diagnosis not present

## 2019-05-12 DIAGNOSIS — R3915 Urgency of urination: Secondary | ICD-10-CM | POA: Diagnosis not present

## 2019-05-12 DIAGNOSIS — D62 Acute posthemorrhagic anemia: Secondary | ICD-10-CM | POA: Diagnosis not present

## 2019-05-12 DIAGNOSIS — E079 Disorder of thyroid, unspecified: Secondary | ICD-10-CM | POA: Diagnosis not present

## 2019-05-12 DIAGNOSIS — I2694 Multiple subsegmental pulmonary emboli without acute cor pulmonale: Secondary | ICD-10-CM | POA: Diagnosis present

## 2019-05-12 DIAGNOSIS — E049 Nontoxic goiter, unspecified: Secondary | ICD-10-CM | POA: Diagnosis present

## 2019-05-12 DIAGNOSIS — M47817 Spondylosis without myelopathy or radiculopathy, lumbosacral region: Secondary | ICD-10-CM | POA: Diagnosis not present

## 2019-05-12 DIAGNOSIS — C801 Malignant (primary) neoplasm, unspecified: Secondary | ICD-10-CM | POA: Diagnosis present

## 2019-05-12 DIAGNOSIS — Z823 Family history of stroke: Secondary | ICD-10-CM | POA: Diagnosis not present

## 2019-05-12 DIAGNOSIS — G959 Disease of spinal cord, unspecified: Secondary | ICD-10-CM | POA: Diagnosis not present

## 2019-05-12 DIAGNOSIS — M62838 Other muscle spasm: Secondary | ICD-10-CM | POA: Diagnosis not present

## 2019-05-12 DIAGNOSIS — F17290 Nicotine dependence, other tobacco product, uncomplicated: Secondary | ICD-10-CM | POA: Diagnosis present

## 2019-05-12 DIAGNOSIS — I708 Atherosclerosis of other arteries: Secondary | ICD-10-CM | POA: Diagnosis not present

## 2019-05-12 DIAGNOSIS — M5124 Other intervertebral disc displacement, thoracic region: Secondary | ICD-10-CM | POA: Diagnosis not present

## 2019-05-12 DIAGNOSIS — C73 Malignant neoplasm of thyroid gland: Secondary | ICD-10-CM | POA: Diagnosis not present

## 2019-05-12 DIAGNOSIS — J90 Pleural effusion, not elsewhere classified: Secondary | ICD-10-CM | POA: Diagnosis not present

## 2019-05-12 DIAGNOSIS — F17210 Nicotine dependence, cigarettes, uncomplicated: Secondary | ICD-10-CM | POA: Diagnosis not present

## 2019-05-12 DIAGNOSIS — R296 Repeated falls: Secondary | ICD-10-CM | POA: Diagnosis not present

## 2019-05-12 DIAGNOSIS — E46 Unspecified protein-calorie malnutrition: Secondary | ICD-10-CM | POA: Diagnosis not present

## 2019-05-12 DIAGNOSIS — G9520 Unspecified cord compression: Secondary | ICD-10-CM | POA: Diagnosis not present

## 2019-05-12 DIAGNOSIS — D630 Anemia in neoplastic disease: Secondary | ICD-10-CM | POA: Diagnosis not present

## 2019-05-12 DIAGNOSIS — Z716 Tobacco abuse counseling: Secondary | ICD-10-CM | POA: Diagnosis not present

## 2019-05-12 DIAGNOSIS — E0789 Other specified disorders of thyroid: Secondary | ICD-10-CM | POA: Diagnosis not present

## 2019-05-12 DIAGNOSIS — Z419 Encounter for procedure for purposes other than remedying health state, unspecified: Secondary | ICD-10-CM

## 2019-05-12 DIAGNOSIS — R918 Other nonspecific abnormal finding of lung field: Secondary | ICD-10-CM | POA: Diagnosis not present

## 2019-05-12 DIAGNOSIS — Z7289 Other problems related to lifestyle: Secondary | ICD-10-CM | POA: Diagnosis not present

## 2019-05-12 DIAGNOSIS — G992 Myelopathy in diseases classified elsewhere: Secondary | ICD-10-CM | POA: Diagnosis not present

## 2019-05-12 DIAGNOSIS — E538 Deficiency of other specified B group vitamins: Secondary | ICD-10-CM | POA: Diagnosis not present

## 2019-05-12 DIAGNOSIS — T380X5A Adverse effect of glucocorticoids and synthetic analogues, initial encounter: Secondary | ICD-10-CM | POA: Diagnosis present

## 2019-05-12 DIAGNOSIS — M899 Disorder of bone, unspecified: Secondary | ICD-10-CM | POA: Diagnosis not present

## 2019-05-12 DIAGNOSIS — E8809 Other disorders of plasma-protein metabolism, not elsewhere classified: Secondary | ICD-10-CM | POA: Diagnosis not present

## 2019-05-12 DIAGNOSIS — M48061 Spinal stenosis, lumbar region without neurogenic claudication: Secondary | ICD-10-CM | POA: Diagnosis not present

## 2019-05-12 DIAGNOSIS — J302 Other seasonal allergic rhinitis: Secondary | ICD-10-CM | POA: Diagnosis not present

## 2019-05-12 DIAGNOSIS — C7800 Secondary malignant neoplasm of unspecified lung: Secondary | ICD-10-CM | POA: Diagnosis not present

## 2019-05-12 DIAGNOSIS — I2699 Other pulmonary embolism without acute cor pulmonale: Secondary | ICD-10-CM | POA: Diagnosis not present

## 2019-05-12 DIAGNOSIS — D72828 Other elevated white blood cell count: Secondary | ICD-10-CM | POA: Diagnosis not present

## 2019-05-12 LAB — URINALYSIS, ROUTINE W REFLEX MICROSCOPIC
Bilirubin Urine: NEGATIVE
Glucose, UA: NEGATIVE mg/dL
Hgb urine dipstick: NEGATIVE
Ketones, ur: 5 mg/dL — AB
Leukocytes,Ua: NEGATIVE
Nitrite: NEGATIVE
Protein, ur: NEGATIVE mg/dL
Specific Gravity, Urine: 1.014 (ref 1.005–1.030)
pH: 6 (ref 5.0–8.0)

## 2019-05-12 LAB — CBC WITH DIFFERENTIAL/PLATELET
Abs Immature Granulocytes: 0.02 10*3/uL (ref 0.00–0.07)
Basophils Absolute: 0.1 10*3/uL (ref 0.0–0.1)
Basophils Relative: 2 %
Eosinophils Absolute: 0.1 10*3/uL (ref 0.0–0.5)
Eosinophils Relative: 3 %
HCT: 37.9 % — ABNORMAL LOW (ref 39.0–52.0)
Hemoglobin: 12 g/dL — ABNORMAL LOW (ref 13.0–17.0)
Immature Granulocytes: 1 %
Lymphocytes Relative: 23 %
Lymphs Abs: 0.9 10*3/uL (ref 0.7–4.0)
MCH: 29.1 pg (ref 26.0–34.0)
MCHC: 31.7 g/dL (ref 30.0–36.0)
MCV: 91.8 fL (ref 80.0–100.0)
Monocytes Absolute: 0.3 10*3/uL (ref 0.1–1.0)
Monocytes Relative: 7 %
Neutro Abs: 2.5 10*3/uL (ref 1.7–7.7)
Neutrophils Relative %: 64 %
Platelets: 374 10*3/uL (ref 150–400)
RBC: 4.13 MIL/uL — ABNORMAL LOW (ref 4.22–5.81)
RDW: 13.5 % (ref 11.5–15.5)
WBC: 3.9 10*3/uL — ABNORMAL LOW (ref 4.0–10.5)
nRBC: 0 % (ref 0.0–0.2)

## 2019-05-12 LAB — COMPREHENSIVE METABOLIC PANEL
ALT: 12 U/L (ref 0–44)
AST: 19 U/L (ref 15–41)
Albumin: 3.3 g/dL — ABNORMAL LOW (ref 3.5–5.0)
Alkaline Phosphatase: 55 U/L (ref 38–126)
Anion gap: 9 (ref 5–15)
BUN: 12 mg/dL (ref 8–23)
CO2: 25 mmol/L (ref 22–32)
Calcium: 9.2 mg/dL (ref 8.9–10.3)
Chloride: 105 mmol/L (ref 98–111)
Creatinine, Ser: 1.07 mg/dL (ref 0.61–1.24)
GFR calc Af Amer: >60 mL/min (ref >60)
GFR calc non Af Amer: >60 mL/min (ref >60)
Glucose, Bld: 102 mg/dL — ABNORMAL HIGH (ref 70–99)
Potassium: 4.3 mmol/L (ref 3.5–5.1)
Sodium: 139 mmol/L (ref 135–145)
Total Bilirubin: 0.5 mg/dL (ref 0.3–1.2)
Total Protein: 7 g/dL (ref 6.5–8.1)

## 2019-05-12 LAB — PREPARE RBC (CROSSMATCH)

## 2019-05-12 MED ORDER — GADOBUTROL 1 MMOL/ML IV SOLN
7.0000 mL | Freq: Once | INTRAVENOUS | Status: AC | PRN
  Administered 2019-05-12: 7 mL via INTRAVENOUS

## 2019-05-12 MED ORDER — CEFAZOLIN SODIUM-DEXTROSE 2-4 GM/100ML-% IV SOLN
2.0000 g | INTRAVENOUS | Status: AC
  Administered 2019-05-13: 2 g via INTRAVENOUS
  Filled 2019-05-12: qty 100

## 2019-05-12 MED ORDER — SODIUM CHLORIDE 0.9% FLUSH
3.0000 mL | Freq: Two times a day (BID) | INTRAVENOUS | Status: DC
  Administered 2019-05-13 – 2019-05-17 (×8): 3 mL via INTRAVENOUS

## 2019-05-12 MED ORDER — SODIUM CHLORIDE 0.9% FLUSH
3.0000 mL | Freq: Two times a day (BID) | INTRAVENOUS | Status: DC
  Administered 2019-05-13: 3 mL via INTRAVENOUS

## 2019-05-12 MED ORDER — ONDANSETRON HCL 4 MG/2ML IJ SOLN: 4.0000 mg | Freq: Four times a day (QID) | INTRAMUSCULAR | Status: DC | PRN

## 2019-05-12 MED ORDER — ACETAMINOPHEN 650 MG RE SUPP: 650.0000 mg | Freq: Four times a day (QID) | RECTAL | Status: DC | PRN

## 2019-05-12 MED ORDER — HYDROCODONE-ACETAMINOPHEN 5-325 MG PO TABS: 1.0000 | ORAL_TABLET | ORAL | Status: DC | PRN

## 2019-05-12 MED ORDER — DEXAMETHASONE SODIUM PHOSPHATE 10 MG/ML IJ SOLN
10.0000 mg | Freq: Four times a day (QID) | INTRAMUSCULAR | Status: DC
  Administered 2019-05-13 – 2019-05-17 (×19): 10 mg via INTRAVENOUS
  Filled 2019-05-12 (×20): qty 1

## 2019-05-12 MED ORDER — ONDANSETRON HCL 4 MG PO TABS: 4.0000 mg | ORAL_TABLET | Freq: Four times a day (QID) | ORAL | Status: DC | PRN

## 2019-05-12 MED ORDER — SODIUM CHLORIDE 0.9% FLUSH: 3.0000 mL | INTRAVENOUS | Status: DC | PRN

## 2019-05-12 MED ORDER — ACETAMINOPHEN 325 MG PO TABS: 650.0000 mg | ORAL_TABLET | Freq: Four times a day (QID) | ORAL | Status: DC | PRN

## 2019-05-12 MED ORDER — SODIUM CHLORIDE 0.9 % IV SOLN: 250.0000 mL | INTRAVENOUS | Status: DC | PRN

## 2019-05-12 NOTE — ED Notes (Signed)
Pt returned from MRI °

## 2019-05-12 NOTE — ED Notes (Signed)
Pt given urinal.

## 2019-05-12 NOTE — Progress Notes (Signed)
[facility] Clinic Note - Initial Visit   Date: 05/12/19  Tyrone Washington MRN: [id number] DOB: 05/06/1955   Dear Tyrone Alar, NP:  Thank you for your kind referral of Tyrone Washington for consultation of bilateral leg numbness. Although his history is well known to you, please allow [place] to reiterate it for the purpose of our medical record. The patient was accompanied to the clinic by self.    History of Present Illness: Tyrone Washington is a 64 y.o.  male with thyroid goiter presenting for evaluation of numbness of the feet and legs.  Starting around October 2020, he began having numbness from his waist down into his legs.  Over the following weeks, his legs became weaker, worse is worse in the left leg and started having falls.  He attributed symptoms to arthritis in the knee.  He is falling frequently, anywhere from 3-4 times per day to several times per week.  He tells me that he was stubborn and put off using a cane until a few days ago.  He denies bowel/bladder incontinence, but does endorse numbness in the groin.  He denies any preceding trauma/injury and denies low back pain.    Out-side paper records, electronic medical record, and images have been reviewed where available and summarized as:  CT head 10/05/2011:  No acute intracranial findings or mass lesion.  Lab Results  Component Value Date   HGBA1C 5.1 02/26/2019   Lab Results  Component Value Date   [id number] 04/08/2019   Lab Results  Component Value Date   TSH 1.93 04/08/2019     Past Medical History:  Diagnosis Date  . Allergy    allergic rhinitis  . Arthritis   . B12 deficiency 04/10/2019  . History of chicken pox   . Hyperlipidemia    no meds now    Past Surgical History:  Procedure Laterality Date  . COLONOSCOPY    . THYROID SURGERY     "Years ago"     Medications:  Outpatient Encounter Medications as of 05/12/2019  Medication Sig Note  . Multiple Vitamin  (MULTIVITAMIN) tablet Take 1 tablet by mouth daily.     . valACYclovir (VALTREX) 500 MG tablet Take 1 tablet (500 mg total) by mouth daily.   . meloxicam (MOBIC) 7.5 MG tablet Take 1 tablet (7.5 mg total) by mouth daily. (Patient not taking: Reported on 05/12/2019) 03/25/2019: Pt is unsure of this   No facility-administered encounter medications on file as of 05/12/2019.    Allergies: No Known Allergies  Family History: Family History  Problem Relation Age of Onset  . Heart disease Father        patient is unsure of history  . CVA Father   . Heart attack Neg Hx   . Colon cancer Neg Hx   . Esophageal cancer Neg Hx   . Rectal cancer Neg Hx   . Stomach cancer Neg Hx     Social History: Social History   Tobacco Use  . Smoking status: Current Every Day Smoker  . Smokeless tobacco: Never Used  . Tobacco comment: Smokes 1 - 2 cigars daily  Substance Use Topics  . Alcohol use: Yes    Alcohol/week: 7.0 standard drinks    Types: 7 Cans of beer per week  . 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     Denies hobbies   No pets.     Vital Signs:  BP 103/68   Pulse 84   Resp 18   Ht 6' (1.829 m)   Wt 160 lb (72.6 kg)   SpO2 99%   BMI 21.70 kg/m   Neurological Exam: MENTAL STATUS including orientation to time, place, person, recent and remote memory, attention span and concentration, language, and fund of knowledge is normal.  Speech is not dysarthric.  CRANIAL NERVES: II:  No visual field defects.  III-IV-VI: Pupils equal round and reactive to light.  Normal conjugate, extra-ocular eye movements in all directions of gaze.  No nystagmus.  No ptosis.   VII:  Normal facial symmetry and movements.   VIII:   Normal hearing and vestibular function.   XI:  Normal shoulder shrug and head rotation.    MOTOR:  Loss of muscle bulk in the lower legs.  No focal atrophy, fasciculations or abnormal movements.  No pronator drift.   Upper Extremity:  Right  Left  Deltoid  5/5   5/5   Biceps  5/5   5/5   Triceps  5/5   5/5   Infraspinatus 5/5  5/5  Medial pectoralis 5/5  5/5  Wrist extensors  5/5   5/5   Wrist flexors  5/5   5/5   Finger extensors  5/5   5/5   Finger flexors  5/5   5/5   Dorsal interossei  5/5   5/5   Abductor pollicis  5/5   5/5   Tone (Ashworth scale)  0  0   Lower Extremity:  Right  Left  Hip flexors  4/5   4/5   Hip extensors  5-/5   4/5   Adductor 5-/5  5-/5  Abductor 5-/5  5-/5  Knee flexors  5/5   4/5   Knee extensors  5/5   5/5   Dorsiflexors  5/5   4-/5   Plantarflexors  5/5   3+/5   Toe extensors  5/5   3/5   Toe flexors  5/5   2/5   Tone (Ashworth scale)  0+  1   MSRs:  Right        Left                  brachioradialis 2+  2+  biceps 2+  2+  triceps 2+  2+  patellar 3+  3+  ankle jerk 3+  3+  Hoffman no  no  plantar response down  up  Bilateral ankle clonus, left > right  SENSORY:  Vibration reduced to 50% at the knees bilaterally, trace at the ankles.  Pin prick and temperature is absent from the waist down. There is no sensory level that I can appreciate on exam.  COORDINATION/GAIT: Normal finger-to- nose-finger. Unable to rise from a chair without using arms.  Severely spastic appearing and unsteady gait, requiring one person assist.  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 History and exam findings highly concerning for cord involvement, specifically, I am concerned about spinal cord compression.  I am sending him to the emergency department for expedited MRI lumbar spine wwo contrast.  If imaging at this level is not diagnostic, evaluate cervical and/or thoracic spine.  I  have personally called the emergency  department and discussed case with emergency room physician.    Thank you for allowing me to participate in patient's care.  If I can answer any additional questions, I would be pleased to do so.    Sincerely,    Haniel Fix K. Posey Pronto, DO

## 2019-05-12 NOTE — ED Notes (Signed)
Pt unable to urinate at this time. Pt will try later.

## 2019-05-12 NOTE — ED Provider Notes (Signed)
  Physical Exam  BP 125/90   Pulse 80   Temp 98.7 F (37.1 C) (Oral)   Resp 18   Ht 6' (1.829 m)   Wt 72.6 kg   SpO2 100%   BMI 21.70 kg/m   Physical Exam  ED Course/Procedures   Clinical Course as of May 11 2341  Mon May 12, 2019  1514 Waiting on MRI T and C spine, reconsult Neuro   [KM]  2141 T10 cord compression. Cspine   [KM]  2240 Spoke with neurosurgery who is to come in and consult on the patient due to cord compression on T9-T10 level. Patient is stable.    [KM]    Clinical Course User Index [KM] Alveria Apley, PA-C    Procedures  MDM  Patient care assumed from [facility] PA due to change of shift. See her notes for full HPI and evaluation. Briefly this is a 64 y/o male with chief complaint of BL leg numbness x 5 months. Patient was evaluate by neurology this morning and sent in to r/o cord compression on MRI     Alveria Apley, PA-C 05/12/19 [place], [place], DO 05/12/19 2352

## 2019-05-12 NOTE — ED Notes (Signed)
Pt transported to MRI 

## 2019-05-12 NOTE — Telephone Encounter (Signed)
See my chart message

## 2019-05-12 NOTE — H&P (Signed)
REGIONAL Tyrone Washington is an 64 y.o. male.   Chief Complaint: Weakness HPI: 64 year old male without known history of malignancy presents with a 8-month history of progressive lower extremity sensory loss with associated weakness.  Patient complains of numbness from his waist down.  He has difficulty controlling his legs.  He is ambulatory with the aid of a cane but has difficulty with leg stiffness and ataxia.  He denies any bowel or bladder dysfunction.  MRI scan evaluation today demonstrated evidence of likely metastatic disease to his thoracic and lumbar spine.  At the T10 level patient has evidence of marked epidural extension of his neoplastic process on the left side with critical spinal cord compression.  The vertebral body and right-sided articular masses are spared however.  He has some metastatic disease prior in his thoracic spine at the T7 level and has some metastatic disease in his sacrum these areas are noncompressive.  Past Medical History:  Diagnosis Date  . Allergy    allergic rhinitis  . Arthritis   . B12 deficiency 04/10/2019  . History of chicken pox   . Hyperlipidemia    no meds now    Past Surgical History:  Procedure Laterality Date  . COLONOSCOPY    . THYROID SURGERY     "Years ago"    Family History  Problem Relation Age of Onset  . Heart disease Father        patient is unsure of history  . CVA Father   . Heart attack Neg Hx   . Colon cancer Neg Hx   . Esophageal cancer Neg Hx   . Rectal cancer Neg Hx   . Stomach cancer Neg Hx    Social History:  reports that he has been smoking. He has never used smokeless tobacco. He reports current alcohol use of about 7.0 standard drinks of alcohol per week. He reports that he does not use drugs.  Allergies: No Known Allergies  (Not in a hospital admission)   Results for orders placed or performed during the hospital encounter of 05/12/19 (from the past 48 hour(s))  CBC with Differential     Status: Abnormal    Collection Time: 05/12/19  9:32 AM  Result Value Ref Range   WBC 3.9 (L) 4.0 - 10.5 K/uL   RBC 4.13 (L) 4.22 - 5.81 MIL/uL   Hemoglobin 12.0 (L) 13.0 - 17.0 g/dL   HCT 37.9 (L) 39.0 - 52.0 %   MCV 91.8 80.0 - 100.0 fL   MCH 29.1 26.0 - 34.0 pg   MCHC 31.7 30.0 - 36.0 g/dL   RDW 13.5 11.5 - 15.5 %   Platelets 374 150 - 400 K/uL   nRBC 0.0 0.0 - 0.2 %   Neutrophils Relative % 64 %   Neutro Abs 2.5 1.7 - 7.7 K/uL   Lymphocytes Relative 23 %   Lymphs Abs 0.9 0.7 - 4.0 K/uL   Monocytes Relative 7 %   Monocytes Absolute 0.3 0.1 - 1.0 K/uL   Eosinophils Relative 3 %   Eosinophils Absolute 0.1 0.0 - 0.5 K/uL   Basophils Relative 2 %   Basophils Absolute 0.1 0.0 - 0.1 K/uL   Immature Granulocytes 1 %   Abs Immature Granulocytes 0.02 0.00 - 0.07 K/uL    Comment: Performed at [facility], [address], [place], [place] [address]  Comprehensive metabolic panel     Status: Abnormal   Collection Time: 05/12/19  9:32 AM  Result Value Ref Range  Sodium 139 135 - 145 mmol/L   Potassium 4.3 3.5 - 5.1 mmol/L   Chloride 105 98 - 111 mmol/L   CO2 25 22 - 32 mmol/L   Glucose, Bld 102 (H) 70 - 99 mg/dL    Comment: Glucose reference range applies only to samples taken after fasting for at least 8 hours.   BUN 12 8 - 23 mg/dL   Creatinine, Ser 1.07 0.61 - 1.24 mg/dL   Calcium 9.2 8.9 - 10.3 mg/dL   Total Protein 7.0 6.5 - 8.1 g/dL   Albumin 3.3 (L) 3.5 - 5.0 g/dL   AST 19 15 - 41 U/L   ALT 12 0 - 44 U/L   Alkaline Phosphatase 55 38 - 126 U/L   Total Bilirubin 0.5 0.3 - 1.2 mg/dL   GFR calc non Af Amer >60 >60 mL/min   GFR calc Af Amer >60 >60 mL/min   Anion gap 9 5 - 15    Comment: Performed at [facility], [place] [address], [place], [place] [address]  Urinalysis, Routine w reflex microscopic     Status: Abnormal   Collection Time: 05/12/19  3:11 PM  Result Value Ref Range   Color, Urine YELLOW YELLOW   APPearance CLEAR CLEAR   Specific Gravity, Urine 1.014 1.005 - 1.030    pH 6.0 5.0 - 8.0   Glucose, UA NEGATIVE NEGATIVE mg/dL   Hgb urine dipstick NEGATIVE NEGATIVE   Bilirubin Urine NEGATIVE NEGATIVE   Ketones, ur 5 (A) NEGATIVE mg/dL   Protein, ur NEGATIVE NEGATIVE mg/dL   Nitrite NEGATIVE NEGATIVE   Leukocytes,Ua NEGATIVE NEGATIVE    Comment: Performed at [facility] [address], [place], [place] [address]   MR Cervical Spine W or Wo Contrast  Result Date: 05/12/2019 CLINICAL DATA:  Bilateral lower extremity weakness, abnormal lumbar spine EXAM: MRI CERVICAL SPINE WITHOUT AND WITH CONTRAST TECHNIQUE: Multiplanar and multiecho pulse sequences of the cervical spine, to include the craniocervical junction and cervicothoracic junction, were obtained without and with intravenous contrast. CONTRAST:  81mL GADAVIST GADOBUTROL 1 MMOL/ML IV SOLN COMPARISON:  None. FINDINGS: Alignment: Anteroposterior alignment is maintained. Vertebrae: Vertebral body heights are preserved. There is no substantial marrow edema or suspicious osseous lesion. Cord: No abnormal signal. Posterior Fossa, vertebral arteries, paraspinal tissues: Markedly enlarged and heterogeneous thyroid is incompletely evaluated. Disc levels: Minor disc bulges are present. Mild facet and uncovertebral hypertrophy. There is no high-grade degenerative stenosis. IMPRESSION: No evidence cervical spine metastatic disease. Electronically Signed   By: Macy Mis M.D.   On: 05/12/2019 21:50   MR THORACIC SPINE W WO CONTRAST  Result Date: 05/12/2019 CLINICAL DATA:  Lower extremity weakness and numbness, abnormal lumbar spine EXAM: MRI THORACIC WITHOUT AND WITH CONTRAST TECHNIQUE: Multiplanar and multiecho pulse sequences of the thoracic spine were obtained without and with intravenous contrast. CONTRAST:  57mL GADAVIST GADOBUTROL 1 MMOL/ML IV SOLN COMPARISON:  None. FINDINGS: Motion artifact is present. Alignment: Thoracic kyphosis is preserved. No substantial anteroposterior listhesis. Vertebrae: There is  abnormal STIR hyperintensity and enhancement at the T7 level extending into the posterior elements on the left. Additional involvement of the inferior left aspect of the T6 vertebral body. There is extraosseous extension at T7 including the ventral and left lateral spinal canal. Effacement of the left lateral subarachnoid space without cord compression. There is also effacement of the left T7-T8 foramen and partial effacement of the left T6-T7 foramen. Abnormal STIR hyperintensity and enhancement at the T10 level extending into  the left much greater than right posterior elements with involvement of the left tenth rib. Additional involvement of the left aspect of the T9 vertebral body and posterior elements. Significant extraosseous extension at inferior T9 and T10 including left ventral, lateral, and dorsal epidural extension with cord displacement and compression. There is likely abnormal cord signal. Effacement of the left T9-T10 and T10-T11 foramina. Cord:  As above.  Otherwise unremarkable. Paraspinal and other soft tissues: Partially imaged significantly enlarged and heterogeneous thyroid is incompletely evaluated. Disc levels: Mild degenerative disc disease and facet arthropathy. There is no high-grade degenerative stenosis. IMPRESSION: Abnormal signal at T7 and T10 with additional less pronounced adjacent involvement at T6 and T9 consistent with metastatic disease. Extraosseous extension is present at both levels as detailed above. Most notably, there is severe canal stenosis with cord compression and abnormal cord signal at the inferior T9 and T10 level. These results were called by telephone at the time of interpretation on 05/12/2019 at 9:43 pm to provider Madilyn Hook, who verbally acknowledged these results. Electronically Signed   By: Macy Mis M.D.   On: 05/12/2019 21:44   MR Lumbar Spine W Wo Contrast  Result Date: 05/12/2019 CLINICAL DATA:  Lumbar radiculopathy, no red flags. Additional history  provided: Numbness of the feet and legs which began October 2020. EXAM: MRI LUMBAR SPINE WITHOUT AND WITH CONTRAST TECHNIQUE: Multiplanar and multiecho pulse sequences of the lumbar spine were obtained without and with intravenous contrast. CONTRAST:  31mL GADAVIST GADOBUTROL 1 MMOL/ML IV SOLN COMPARISON:  No pertinent prior studies available for comparison. FINDINGS: Segmentation: For the purposes of this dictation, five lumbar vertebrae are assumed and the caudal most well-formed intervertebral disc is designated L5-S1. Alignment: Straightening of the expected lumbar lordosis. No significant spondylolisthesis. Vertebrae: Vertebral body height is maintained. There is a 13 mm lesion within the posteroinferior aspect of the S1 sacral segment which demonstrates T1 hypointensity, T2/STIR hyperintensity and avid enhancement (series 3, image 7) (series 7, image 7). Additional 15 mm enhancing lesion within the right sacral ala (series 6, image 35) (series 8, image 35). Mild L5-S1 degenerative endplate irregularity with mixed degenerative endplate marrow signal and a small Schmorl node within the S1 superior endplate. Conus medullaris and cauda equina: Conus extends to the L1-L2 level. No signal abnormality within the visualized distal spinal cord. No abnormal enhancement of the visualized distal spinal cord or cauda equina nerve roots. Paraspinal and other soft tissues: No abnormality identified within included portions of the abdomen/retroperitoneum. Paraspinal soft tissues within normal limits. Disc levels: Moderate L5-S1 disc degeneration. Mild-to-moderate disc degeneration is also present at L3-L4 and L4-L5. T12-L1: This level is not included on axial imaging. No disc herniation. No significant canal or foraminal stenosis. L1-L2: No disc herniation. No significant canal or foraminal stenosis. Small posteriorly projecting synovial facet cyst on the left. L2-L3: No disc herniation. No significant canal or foraminal  stenosis. L3-L4: Disc bulge with endplate spurring. Mild facet arthrosis/ligamentum flavum hypertrophy. Mild relative spinal canal narrowing without nerve root impingement. Mild bilateral neural foraminal narrowing. L4-L5: Disc bulge with endplate spurring. Mild facet arthrosis/ligamentum flavum hypertrophy. Mild relative spinal canal narrowing without nerve root impingement. Mild bilateral neural foraminal narrowing. L5-S1: Disc bulge with endplate spurring. Mild facet arthrosis. No significant spinal canal stenosis. Mild left neural foraminal narrowing. IMPRESSION: Two enhancing osseous lesions, one measuring 13 mm within the posterior inferior S1 sacral segment, and the other measuring 15 mm within the right sacral ala. Findings are highly suspicious for osseous metastatic  disease. Given the provided history of numbness from the waist down, consider contrast-enhanced MRI of the thoracic spine to assess for metastatic disease and cord compression at these levels. Lumbar spondylosis as detailed and greatest at L5-S1. No more than mild spinal canal or neural foraminal narrowing within the lumbar spine. Electronically Signed   By: Kellie Simmering DO   On: 05/12/2019 13:27    Pertinent items noted in HPI and remainder of comprehensive ROS otherwise negative.  Blood pressure 114/72, pulse 80, temperature 98.7 F (37.1 C), temperature source Oral, resp. rate 18, height 6' (1.829 m), weight 72.6 kg, SpO2 100 %.  Patient is awake and alert.  He is oriented and appropriate.  His speech is fluent.  Judgment insight appear intact.  Cranial nerve function normal bilateral.  Motor examination is upper extremities reveals 5/5 strength.  Motor examination of the lower extremities reveals marked spasticity with 4/5 strength.  Relative sensory level around T10.  Lower extremity reflexes are markedly increased.  Toes are upgoing to plantar stimulation.  Clonus in both ankles.  Examination head ears eyes nose and throat is  unremarked.  Neck examination reveals a very large thyroid mass consistent with a goiter (patient is preop for thyroid goiter resection and this is felt to be unlikely cancerous per his ear nose and throat specialist).  Airway is patent.  Patient is breathing easily.  Chest and abdomen are benign otherwise.  Extremities are free from injury deformity. 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  I discussed situation with patient.  Given the size of this tumor coupled with his spinal cord dysfunction I believe his best option for improvement is with surgical decompression and tumor resection by means of a T10 laminectomy with resection of epidural tumor.  Patient is aware that he will also require adjunctive radiation treatment to not only this level but his other levels of spinal involvement.  Hopefully pathology will help point to his primary tumor.  We will get CT scans of his chest and abdomen.  Plan for surgery tomorrow.  I discussed the risks and benefits involved including but not limited to risk of anesthesia, bleeding, infection, CSF leak, nerve root injury, spinal cord injury, tumor recurrence, continued pain and weakness, and nonbenefit.  Appears understand.  He wishes to proceed with surgery.  Mallie Mussel A Manjot Hinks 05/12/2019, 11:19 PM

## 2019-05-12 NOTE — ED Provider Notes (Signed)
[facility] EMERGENCY DEPARTMENT Provider Note   CSN: [id number] Arrival date & time: 05/12/19  0847     History Chief Complaint  Patient presents with  . Back Pain    Deklyn FATE MCCRARY is a 64 y.o. male.  64 y.o male with a PMH of Migraine HA, Goiter, presents to the ED with a chief complaint of BL leg numbness x 5 months. Patient was evaluate by neurology this morning and sent in to r/o cord compression on MRI. He reports this has been ongoing for months, he is usually ambulatory at baseline but has been walking with a cane these past months. He reports no pain along his lower spine, does have some numbness to bilateral legs along with some groin paresthesias.  Denies any bowel or bladder incontinence, fevers, IV drug use, trauma.  The history is provided by the patient.  Back Pain Location:  Lumbar spine Associated symptoms: numbness   Associated symptoms: no abdominal pain, no chest pain, no fever and no headaches        Past Medical History:  Diagnosis Date  . Allergy    allergic rhinitis  . Arthritis   . B12 deficiency 04/10/2019  . History of chicken pox   . Hyperlipidemia    no meds now    Patient Active Problem List   Diagnosis Date Noted  . B12 deficiency 04/10/2019  . 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 Preventative health care 07/16/2015  . Nonspecific abnormal electrocardiogram (ECG) (EKG) 07/16/2015  . Migraine headache 11/10/2013  . WEIGHT LOSS 05/27/2010  . Goiter 01/14/2009  . 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 GENITAL HERPES 12/15/2008  . Hyperlipidemia 12/15/2008  . ALLERGIC RHINITIS 12/15/2008  . Neck mass 12/15/2008    Past Surgical History:  Procedure Laterality Date  . COLONOSCOPY    . THYROID SURGERY     "Years ago"       Family History  Problem Relation Age of Onset  . Heart disease Father        patient is unsure of history  . CVA Father   . Heart attack Neg Hx   . Colon cancer  Neg Hx   . Esophageal cancer Neg Hx   . Rectal cancer Neg Hx   . Stomach cancer Neg Hx     Social History   Tobacco Use  . Smoking status: Current Every Day Smoker  . Smokeless tobacco: Never Used  . Tobacco comment: Smokes 1 - 2 cigars daily  Substance Use Topics  . Alcohol use: Yes    Alcohol/week: 7.0 standard drinks    Types: 7 Cans of beer per week  . Drug use: Never    Home Medications Prior to Admission medications   Medication Sig Start Date End Date Taking? Authorizing Provider  meloxicam (MOBIC) 7.5 MG tablet Take 1 tablet (7.5 mg total) by mouth daily. Patient not taking: Reported on 05/12/2019 02/26/19   [name], NP  Multiple Vitamin (MULTIVITAMIN) tablet Take 1 tablet by mouth daily.      [provider]  valACYclovir (VALTREX) 500 MG tablet Take 1 tablet (500 mg total) by mouth daily. 04/08/19   [name], NP    Allergies    Patient has no known allergies.  Review of Systems   Review of Systems  Constitutional: Negative for fever.  HENT: Negative for sore throat.   Respiratory: Negative for shortness of breath.   Cardiovascular: Negative for chest pain.  Gastrointestinal: Negative for  abdominal pain, nausea and vomiting.  Genitourinary: Negative for flank pain.  Musculoskeletal: Positive for back pain.  Neurological: Positive for numbness. Negative for light-headedness and headaches.  All other systems reviewed and are negative.   Physical Exam Updated Vital Signs BP 115/75   Pulse 61   Temp 98.4 F (36.9 C) (Oral)   Resp 15   Ht 6' (1.829 m)   Wt 72.6 kg   SpO2 100%   BMI 21.70 kg/m   Physical Exam Vitals and nursing note reviewed.  Constitutional:      Appearance: He is well-developed.  HENT:     Head: Normocephalic and atraumatic.  Eyes:     General: No scleral icterus.    Pupils: Pupils are equal, round, and reactive to light.  Neck:     Thyroid: Thyroid mass present.     Comments: Extensive goiter  localized.  Cardiovascular:     Heart sounds: Normal heart sounds.  Pulmonary:     Effort: Pulmonary effort is normal.     Breath sounds: Normal breath sounds. No wheezing.  Chest:     Chest wall: No tenderness.  Abdominal:     General: Bowel sounds are normal. There is no distension.     Palpations: Abdomen is soft.     Tenderness: There is no abdominal tenderness.  Musculoskeletal:        General: No tenderness or deformity.     Cervical back: Normal range of motion.  Skin:    General: Skin is warm and dry.  Neurological:     Mental Status: He is alert and oriented to person, place, and time.     Comments: No facial asymmetry,or  dysarthria,  Bilateral upper extremities 5 out of 5 strength. LLE 3/5 strength with dorsiflexion and plantarflexion RLE 5/5 strength with dorsiflexion and platnarflexion      ED Results / Procedures / Treatments   Labs (all labs ordered are listed, but only abnormal results are displayed) Labs Reviewed  CBC WITH DIFFERENTIAL/PLATELET - Abnormal; Notable for the following components:      Result Value   WBC 3.9 (*)    RBC 4.13 (*)    Hemoglobin 12.0 (*)    HCT 37.9 (*)    All other components within normal limits  COMPREHENSIVE METABOLIC PANEL - Abnormal; Notable for the following components:   Glucose, Bld 102 (*)    Albumin 3.3 (*)    All other components within normal limits  URINALYSIS, ROUTINE W REFLEX MICROSCOPIC    EKG None  Radiology MR Lumbar Spine W Wo Contrast  Result Date: 05/12/2019 CLINICAL DATA:  Lumbar radiculopathy, no red flags. Additional history provided: Numbness of the feet and legs which began October 2020. EXAM: MRI LUMBAR SPINE WITHOUT AND WITH CONTRAST TECHNIQUE: Multiplanar and multiecho pulse sequences of the lumbar spine were obtained without and with intravenous contrast. CONTRAST:  46mL GADAVIST GADOBUTROL 1 MMOL/ML IV SOLN COMPARISON:  No pertinent prior studies available for comparison. FINDINGS:  Segmentation: For the purposes of this dictation, five lumbar vertebrae are assumed and the caudal most well-formed intervertebral disc is designated L5-S1. Alignment: Straightening of the expected lumbar lordosis. No significant spondylolisthesis. Vertebrae: Vertebral body height is maintained. There is a 13 mm lesion within the posteroinferior aspect of the S1 sacral segment which demonstrates T1 hypointensity, T2/STIR hyperintensity and avid enhancement (series 3, image 7) (series 7, image 7). Additional 15 mm enhancing lesion within the right sacral ala (series 6, image 35) (series 8, image 35). Mild  L5-S1 degenerative endplate irregularity with mixed degenerative endplate marrow signal and a small Schmorl node within the S1 superior endplate. Conus medullaris and cauda equina: Conus extends to the L1-L2 level. No signal abnormality within the visualized distal spinal cord. No abnormal enhancement of the visualized distal spinal cord or cauda equina nerve roots. Paraspinal and other soft tissues: No abnormality identified within included portions of the abdomen/retroperitoneum. Paraspinal soft tissues within normal limits. Disc levels: Moderate L5-S1 disc degeneration. Mild-to-moderate disc degeneration is also present at L3-L4 and L4-L5. T12-L1: This level is not included on axial imaging. No disc herniation. No significant canal or foraminal stenosis. L1-L2: No disc herniation. No significant canal or foraminal stenosis. Small posteriorly projecting synovial facet cyst on the left. L2-L3: No disc herniation. No significant canal or foraminal stenosis. L3-L4: Disc bulge with endplate spurring. Mild facet arthrosis/ligamentum flavum hypertrophy. Mild relative spinal canal narrowing without nerve root impingement. Mild bilateral neural foraminal narrowing. L4-L5: Disc bulge with endplate spurring. Mild facet arthrosis/ligamentum flavum hypertrophy. Mild relative spinal canal narrowing without nerve root  impingement. Mild bilateral neural foraminal narrowing. L5-S1: Disc bulge with endplate spurring. Mild facet arthrosis. No significant spinal canal stenosis. Mild left neural foraminal narrowing. IMPRESSION: Two enhancing osseous lesions, one measuring 13 mm within the posterior inferior S1 sacral segment, and the other measuring 15 mm within the right sacral ala. Findings are highly suspicious for osseous metastatic disease. Given the provided history of numbness from the waist down, consider contrast-enhanced MRI of the thoracic spine to assess for metastatic disease and cord compression at these levels. Lumbar spondylosis as detailed and greatest at L5-S1. No more than mild spinal canal or neural foraminal narrowing within the lumbar spine. Electronically Signed   By: Kellie Simmering DO   On: 05/12/2019 13:27    Procedures Procedures (including critical care time)  Medications Ordered in ED Medications  gadobutrol (GADAVIST) 1 MMOL/ML injection 7 mL (7 mLs Intravenous Contrast Given 05/12/19 1245)    ED Course  I have reviewed the triage vital signs and the nursing notes.  Pertinent labs & imaging results that were available during my care of the patient were reviewed by me and considered in my medical decision making (see chart for details).    MDM Rules/Calculators/A&P  Pertinent past medical history presents to the ED sent in by neurology for bilateral lower extremity numbness, this has been ongoing for the past 5 months.  Reports he usually ambulates without assistance however has not been ambulating with a cane for the past few months, reports no trauma, IV drug use, bowel or bladder incontinence, does endorse some groin numbness.  Neurology recommended MRI with and without contrast of the lumbar spine to rule out cord compression.  During my evaluation patient is overall well-appearing, appears in no distress, reports no back pain at the moment, does have bilateral numbness.  Weaker on the  left lower leg with plantarflexion and dorsiflexion compared to the right leg.  Does have full range of motion of his legs, sensation is intact.  Pulses are present.  MR Lumbar spine w and w/o showed: Two enhancing osseous lesions, one measuring 13 mm within the  posterior inferior S1 sacral segment, and the other measuring 15 mm  within the right sacral ala. Findings are highly suspicious for  osseous metastatic disease. Given the provided history of numbness  from the waist down, consider contrast-enhanced MRI of the thoracic  spine to assess for metastatic disease and cord compression at these  levels.  Lumbar spondylosis as detailed and greatest at L5-S1. No more than  mild spinal canal or neural foraminal narrowing within the lumbar  spine.     MRI thoracic spine was ordered to assess for metastatic disease.  2:01 PM Spoke to Neurology Dr. Lorraine Lax from neurology who recommended addition of C-spine imaging, please call him with results upon returning.    Patient care signed out to incoming team pending MR imaging and neurology follow up.    Portions of this note were generated with [employer]. Dictation errors may occur despite best attempts at proofreading.  Final Clinical Impression(s) / ED Diagnoses Final diagnoses:  Bilateral leg numbness    Rx / DC Orders ED Discharge Orders    None       Janeece Fitting, PA-C 05/12/19 1449    Charlesetta Shanks, MD 05/21/19 [id number]

## 2019-05-12 NOTE — ED Triage Notes (Addendum)
Pt d/c from neurologist across the street. Told to come here for MRI of back to evaluate for cord compression. Pt reports numbness from hip down since October

## 2019-05-12 NOTE — Patient Instructions (Signed)
Please go to the emergency department for expedited MRI lumbar spine to evaluate for spinal cord compression.

## 2019-05-13 ENCOUNTER — Telehealth: Payer: Self-pay | Admitting: *Deleted

## 2019-05-13 ENCOUNTER — Ambulatory Visit: Payer: BC Managed Care – PPO | Admitting: Family

## 2019-05-13 ENCOUNTER — Inpatient Hospital Stay (HOSPITAL_COMMUNITY): Payer: BC Managed Care – PPO

## 2019-05-13 ENCOUNTER — Encounter (HOSPITAL_COMMUNITY): Payer: Self-pay | Admitting: Neurosurgery

## 2019-05-13 ENCOUNTER — Other Ambulatory Visit: Payer: Self-pay

## 2019-05-13 ENCOUNTER — Inpatient Hospital Stay (HOSPITAL_COMMUNITY)
Admission: EM | Disposition: A | Discharge status: Rehabilitation Facility | Payer: Self-pay | Source: Ambulatory Visit | Attending: Neurosurgery

## 2019-05-13 ENCOUNTER — Encounter (HOSPITAL_COMMUNITY)
Admission: EM | Disposition: A | Discharge status: Rehabilitation Facility | Payer: Self-pay | Source: Ambulatory Visit | Attending: Neurosurgery

## 2019-05-13 ENCOUNTER — Inpatient Hospital Stay (HOSPITAL_COMMUNITY): Payer: BC Managed Care – PPO | Admitting: Certified Registered Nurse Anesthetist

## 2019-05-13 DIAGNOSIS — M899 Disorder of bone, unspecified: Secondary | ICD-10-CM | POA: Diagnosis not present

## 2019-05-13 DIAGNOSIS — Z981 Arthrodesis status: Secondary | ICD-10-CM | POA: Diagnosis not present

## 2019-05-13 DIAGNOSIS — C7951 Secondary malignant neoplasm of bone: Secondary | ICD-10-CM | POA: Diagnosis not present

## 2019-05-13 DIAGNOSIS — C801 Malignant (primary) neoplasm, unspecified: Secondary | ICD-10-CM | POA: Diagnosis not present

## 2019-05-13 DIAGNOSIS — I708 Atherosclerosis of other arteries: Secondary | ICD-10-CM | POA: Diagnosis not present

## 2019-05-13 DIAGNOSIS — R200 Anesthesia of skin: Secondary | ICD-10-CM | POA: Diagnosis not present

## 2019-05-13 DIAGNOSIS — G992 Myelopathy in diseases classified elsewhere: Secondary | ICD-10-CM | POA: Diagnosis not present

## 2019-05-13 DIAGNOSIS — D630 Anemia in neoplastic disease: Secondary | ICD-10-CM | POA: Diagnosis not present

## 2019-05-13 DIAGNOSIS — E785 Hyperlipidemia, unspecified: Secondary | ICD-10-CM | POA: Diagnosis not present

## 2019-05-13 DIAGNOSIS — R918 Other nonspecific abnormal finding of lung field: Secondary | ICD-10-CM | POA: Diagnosis not present

## 2019-05-13 HISTORY — PX: LAMINECTOMY: SHX219

## 2019-05-13 LAB — CBC
HCT: 33.8 % — ABNORMAL LOW (ref 39.0–52.0)
Hemoglobin: 10.7 g/dL — ABNORMAL LOW (ref 13.0–17.0)
MCH: 29.3 pg (ref 26.0–34.0)
MCHC: 31.7 g/dL (ref 30.0–36.0)
MCV: 92.6 fL (ref 80.0–100.0)
Platelets: 326 10*3/uL (ref 150–400)
RBC: 3.65 MIL/uL — ABNORMAL LOW (ref 4.22–5.81)
RDW: 13.6 % (ref 11.5–15.5)
WBC: 4.6 10*3/uL (ref 4.0–10.5)
nRBC: 0 % (ref 0.0–0.2)

## 2019-05-13 LAB — PREPARE RBC (CROSSMATCH)

## 2019-05-13 LAB — CBC WITH DIFFERENTIAL/PLATELET
Abs Immature Granulocytes: 0.22 10*3/uL — ABNORMAL HIGH (ref 0.00–0.07)
Basophils Absolute: 0 10*3/uL (ref 0.0–0.1)
Basophils Relative: 0 %
Eosinophils Absolute: 0 10*3/uL (ref 0.0–0.5)
Eosinophils Relative: 0 %
HCT: 29.2 % — ABNORMAL LOW (ref 39.0–52.0)
Hemoglobin: 9.6 g/dL — ABNORMAL LOW (ref 13.0–17.0)
Immature Granulocytes: 1 %
Lymphocytes Relative: 6 %
Lymphs Abs: 0.9 10*3/uL (ref 0.7–4.0)
MCH: 29.5 pg (ref 26.0–34.0)
MCHC: 32.9 g/dL (ref 30.0–36.0)
MCV: 89.8 fL (ref 80.0–100.0)
Monocytes Absolute: 0.4 10*3/uL (ref 0.1–1.0)
Monocytes Relative: 3 %
Neutro Abs: 15 10*3/uL — ABNORMAL HIGH (ref 1.7–7.7)
Neutrophils Relative %: 90 %
Platelets: 300 10*3/uL (ref 150–400)
RBC: 3.25 MIL/uL — ABNORMAL LOW (ref 4.22–5.81)
RDW: 13.7 % (ref 11.5–15.5)
WBC: 16.6 10*3/uL — ABNORMAL HIGH (ref 4.0–10.5)
nRBC: 0 % (ref 0.0–0.2)

## 2019-05-13 LAB — HEMOGLOBIN AND HEMATOCRIT, BLOOD
HCT: 30.3 % — ABNORMAL LOW (ref 39.0–52.0)
Hemoglobin: 9.8 g/dL — ABNORMAL LOW (ref 13.0–17.0)

## 2019-05-13 LAB — POCT I-STAT, CHEM 8
BUN: 15 mg/dL (ref 8–23)
Calcium, Ion: 1.21 mmol/L (ref 1.15–1.40)
Chloride: 103 mmol/L (ref 98–111)
Creatinine, Ser: 0.8 mg/dL (ref 0.61–1.24)
Glucose, Bld: 186 mg/dL — ABNORMAL HIGH (ref 70–99)
HCT: 29 % — ABNORMAL LOW (ref 39.0–52.0)
Hemoglobin: 9.9 g/dL — ABNORMAL LOW (ref 13.0–17.0)
Potassium: 4 mmol/L (ref 3.5–5.1)
Sodium: 140 mmol/L (ref 135–145)
TCO2: 24 mmol/L (ref 22–32)

## 2019-05-13 LAB — SARS CORONAVIRUS 2 (TAT 6-24 HRS): SARS Coronavirus 2: NEGATIVE

## 2019-05-13 LAB — FIBRINOGEN: Fibrinogen: 113 mg/dL — ABNORMAL LOW (ref 210–475)

## 2019-05-13 LAB — SURGICAL PCR SCREEN
MRSA, PCR: NEGATIVE
Staphylococcus aureus: NEGATIVE

## 2019-05-13 LAB — PLATELET COUNT: Platelets: 306 10*3/uL (ref 150–400)

## 2019-05-13 LAB — APTT: aPTT: 25 seconds (ref 24–36)

## 2019-05-13 LAB — HIV ANTIBODY (ROUTINE TESTING W REFLEX): HIV Screen 4th Generation wRfx: NONREACTIVE

## 2019-05-13 LAB — PROTIME-INR
INR: 1.5 — ABNORMAL HIGH (ref 0.8–1.2)
Prothrombin Time: 18.3 seconds — ABNORMAL HIGH (ref 11.4–15.2)

## 2019-05-13 LAB — ABO/RH: ABO/RH(D): O POS

## 2019-05-13 SURGERY — THORACIC LAMINECTOMY FOR TUMOR
Anesthesia: General | Site: Spine Thoracic

## 2019-05-13 SURGERY — THORACIC LAMINECTOMY FOR TUMOR
Anesthesia: General

## 2019-05-13 MED ORDER — BISACODYL 10 MG RE SUPP: 10.0000 mg | Freq: Every day | RECTAL | Status: DC | PRN

## 2019-05-13 MED ORDER — HYDROMORPHONE HCL 1 MG/ML IJ SOLN: 1.0000 mg | INTRAMUSCULAR | Status: DC | PRN

## 2019-05-13 MED ORDER — IOHEXOL 300 MG/ML  SOLN
100.0000 mL | Freq: Once | INTRAMUSCULAR | Status: AC | PRN
  Administered 2019-05-13: 100 mL via INTRAVENOUS

## 2019-05-13 MED ORDER — MIDAZOLAM HCL 2 MG/2ML IJ SOLN
INTRAMUSCULAR | Status: AC
  Filled 2019-05-13: qty 2

## 2019-05-13 MED ORDER — LACTATED RINGERS IV SOLN: INTRAVENOUS | Status: DC

## 2019-05-13 MED ORDER — BACITRACIN ZINC 500 UNIT/GM EX OINT
TOPICAL_OINTMENT | CUTANEOUS | Status: AC
  Filled 2019-05-13: qty 28.35

## 2019-05-13 MED ORDER — FENTANYL CITRATE (PF) 250 MCG/5ML IJ SOLN
INTRAMUSCULAR | Status: AC
  Filled 2019-05-13: qty 5

## 2019-05-13 MED ORDER — THROMBIN 20000 UNITS EX SOLR
CUTANEOUS | Status: AC
  Filled 2019-05-13: qty 40000

## 2019-05-13 MED ORDER — ALBUMIN HUMAN 5 % IV SOLN: INTRAVENOUS | Status: DC | PRN

## 2019-05-13 MED ORDER — THROMBIN 5000 UNITS EX SOLR
CUTANEOUS | Status: AC
  Filled 2019-05-13: qty 10000

## 2019-05-13 MED ORDER — SODIUM CHLORIDE 0.9 % IV SOLN: INTRAVENOUS | Status: DC | PRN

## 2019-05-13 MED ORDER — VANCOMYCIN HCL 1000 MG IV SOLR
INTRAVENOUS | Status: AC
  Filled 2019-05-13: qty 1000

## 2019-05-13 MED ORDER — DEXAMETHASONE SODIUM PHOSPHATE 10 MG/ML IJ SOLN
INTRAMUSCULAR | Status: AC
  Filled 2019-05-13: qty 1

## 2019-05-13 MED ORDER — FENTANYL CITRATE (PF) 250 MCG/5ML IJ SOLN
INTRAMUSCULAR | Status: DC | PRN
  Administered 2019-05-13: 50 ug via INTRAVENOUS
  Administered 2019-05-13 (×2): 100 ug via INTRAVENOUS
  Administered 2019-05-13: 50 ug via INTRAVENOUS

## 2019-05-13 MED ORDER — THROMBIN 5000 UNITS EX SOLR
CUTANEOUS | Status: AC
  Filled 2019-05-13: qty 5000

## 2019-05-13 MED ORDER — SODIUM CHLORIDE 0.9% IV SOLUTION: Freq: Once | INTRAVENOUS | Status: AC

## 2019-05-13 MED ORDER — ROCURONIUM BROMIDE 10 MG/ML (PF) SYRINGE
PREFILLED_SYRINGE | INTRAVENOUS | Status: AC
  Filled 2019-05-13: qty 10

## 2019-05-13 MED ORDER — PHENYLEPHRINE 40 MCG/ML (10ML) SYRINGE FOR IV PUSH (FOR BLOOD PRESSURE SUPPORT)
PREFILLED_SYRINGE | INTRAVENOUS | Status: DC | PRN
  Administered 2019-05-13: 80 ug via INTRAVENOUS

## 2019-05-13 MED ORDER — SODIUM CHLORIDE 0.9 % IV SOLN: 250.0000 mL | INTRAVENOUS | Status: DC

## 2019-05-13 MED ORDER — LIDOCAINE 2% (20 MG/ML) 5 ML SYRINGE
INTRAMUSCULAR | Status: AC
  Filled 2019-05-13: qty 5

## 2019-05-13 MED ORDER — MIDAZOLAM HCL 5 MG/5ML IJ SOLN
INTRAMUSCULAR | Status: DC | PRN
  Administered 2019-05-13: 2 mg via INTRAVENOUS

## 2019-05-13 MED ORDER — POLYETHYLENE GLYCOL 3350 17 G PO PACK: 17.0000 g | PACK | Freq: Every day | ORAL | Status: DC | PRN

## 2019-05-13 MED ORDER — ONDANSETRON HCL 4 MG/2ML IJ SOLN
INTRAMUSCULAR | Status: DC | PRN
  Administered 2019-05-13: 4 mg via INTRAVENOUS

## 2019-05-13 MED ORDER — SODIUM CHLORIDE 0.9% FLUSH
3.0000 mL | Freq: Two times a day (BID) | INTRAVENOUS | Status: DC
  Administered 2019-05-13 – 2019-05-17 (×8): 3 mL via INTRAVENOUS

## 2019-05-13 MED ORDER — DIAZEPAM 5 MG PO TABS: 5.0000 mg | ORAL_TABLET | Freq: Four times a day (QID) | ORAL | Status: DC | PRN

## 2019-05-13 MED ORDER — OXYCODONE HCL 5 MG/5ML PO SOLN: 5.0000 mg | Freq: Once | ORAL | Status: DC | PRN

## 2019-05-13 MED ORDER — THROMBIN 5000 UNITS EX SOLR
OROMUCOSAL | Status: DC | PRN
  Administered 2019-05-13 (×6): 5 mL via TOPICAL

## 2019-05-13 MED ORDER — PROPOFOL 10 MG/ML IV BOLUS
INTRAVENOUS | Status: DC | PRN
  Administered 2019-05-13: 150 mg via INTRAVENOUS
  Administered 2019-05-13: 50 mg via INTRAVENOUS

## 2019-05-13 MED ORDER — ROCURONIUM BROMIDE 10 MG/ML (PF) SYRINGE
PREFILLED_SYRINGE | INTRAVENOUS | Status: DC | PRN
  Administered 2019-05-13: 50 mg via INTRAVENOUS
  Administered 2019-05-13: 10 mg via INTRAVENOUS
  Administered 2019-05-13: 20 mg via INTRAVENOUS

## 2019-05-13 MED ORDER — 0.9 % SODIUM CHLORIDE (POUR BTL) OPTIME
TOPICAL | Status: DC | PRN
  Administered 2019-05-13: 14:00:00 1000 mL

## 2019-05-13 MED ORDER — SODIUM CHLORIDE 0.9 % IV SOLN: INTRAVENOUS | Status: DC

## 2019-05-13 MED ORDER — THROMBIN 5000 UNITS EX SOLR
CUTANEOUS | Status: AC
  Filled 2019-05-13: qty 15000

## 2019-05-13 MED ORDER — CEFAZOLIN SODIUM-DEXTROSE 1-4 GM/50ML-% IV SOLN
1.0000 g | Freq: Three times a day (TID) | INTRAVENOUS | Status: AC
  Administered 2019-05-13 – 2019-05-14 (×2): 1 g via INTRAVENOUS
  Filled 2019-05-13 (×2): qty 50

## 2019-05-13 MED ORDER — PHENYLEPHRINE HCL-NACL 10-0.9 MG/250ML-% IV SOLN
0.0000 ug/min | INTRAVENOUS | Status: DC
  Administered 2019-05-13: 80 ug/min via INTRAVENOUS
  Administered 2019-05-13: 70 ug/min via INTRAVENOUS
  Administered 2019-05-13: 90 ug/min via INTRAVENOUS
  Administered 2019-05-13: 80 ug/min via INTRAVENOUS
  Administered 2019-05-14: 125 ug/min via INTRAVENOUS
  Administered 2019-05-14: 110 ug/min via INTRAVENOUS
  Administered 2019-05-14: 100 ug/min via INTRAVENOUS
  Administered 2019-05-14: 125 ug/min via INTRAVENOUS
  Administered 2019-05-14: 110 ug/min via INTRAVENOUS
  Administered 2019-05-14: 85 ug/min via INTRAVENOUS
  Administered 2019-05-14: 115 ug/min via INTRAVENOUS
  Filled 2019-05-13: qty 250
  Filled 2019-05-13: qty 10
  Filled 2019-05-13 (×9): qty 250
  Filled 2019-05-13: qty 10
  Filled 2019-05-13 (×6): qty 250

## 2019-05-13 MED ORDER — PROPOFOL 10 MG/ML IV BOLUS
INTRAVENOUS | Status: AC
  Filled 2019-05-13: qty 20

## 2019-05-13 MED ORDER — PHENYLEPHRINE HCL-NACL 10-0.9 MG/250ML-% IV SOLN
INTRAVENOUS | Status: DC | PRN
  Administered 2019-05-13: 20 ug/min via INTRAVENOUS

## 2019-05-13 MED ORDER — BUPIVACAINE HCL (PF) 0.25 % IJ SOLN
INTRAMUSCULAR | Status: AC
  Filled 2019-05-13: qty 30

## 2019-05-13 MED ORDER — PHENOL 1.4 % MT LIQD: 1.0000 | OROMUCOSAL | Status: DC | PRN

## 2019-05-13 MED ORDER — THROMBIN 20000 UNITS EX SOLR
CUTANEOUS | Status: DC | PRN
  Administered 2019-05-13: 14:00:00 20 mL via TOPICAL

## 2019-05-13 MED ORDER — SODIUM CHLORIDE 0.9% FLUSH: 3.0000 mL | INTRAVENOUS | Status: DC | PRN

## 2019-05-13 MED ORDER — CHLORHEXIDINE GLUCONATE CLOTH 2 % EX PADS
6.0000 | MEDICATED_PAD | Freq: Every day | CUTANEOUS | Status: DC
  Administered 2019-05-13 – 2019-05-17 (×5): 6 via TOPICAL

## 2019-05-13 MED ORDER — IOHEXOL 350 MG/ML SOLN: 75.0000 mL | Freq: Once | INTRAVENOUS | Status: DC | PRN

## 2019-05-13 MED ORDER — SODIUM CHLORIDE 0.9 % IV SOLN
INTRAVENOUS | Status: DC | PRN
  Administered 2019-05-13: 14:00:00 500 mL

## 2019-05-13 MED ORDER — SUGAMMADEX SODIUM 200 MG/2ML IV SOLN
INTRAVENOUS | Status: DC | PRN
  Administered 2019-05-13: 145 mg via INTRAVENOUS

## 2019-05-13 MED ORDER — LIDOCAINE 2% (20 MG/ML) 5 ML SYRINGE
INTRAMUSCULAR | Status: DC | PRN
  Administered 2019-05-13: 40 mg via INTRAVENOUS

## 2019-05-13 MED ORDER — OXYCODONE HCL 5 MG PO TABS: 5.0000 mg | ORAL_TABLET | Freq: Once | ORAL | Status: DC | PRN

## 2019-05-13 MED ORDER — OXYCODONE HCL 5 MG PO TABS: 10.0000 mg | ORAL_TABLET | ORAL | Status: DC | PRN

## 2019-05-13 MED ORDER — MENTHOL 3 MG MT LOZG
1.0000 | LOZENGE | OROMUCOSAL | Status: DC | PRN
  Filled 2019-05-13 (×2): qty 9

## 2019-05-13 MED ORDER — ONDANSETRON HCL 4 MG/2ML IJ SOLN: 4.0000 mg | Freq: Four times a day (QID) | INTRAMUSCULAR | Status: DC | PRN

## 2019-05-13 MED ORDER — LACTATED RINGERS IV SOLN: INTRAVENOUS | Status: DC | PRN

## 2019-05-13 MED ORDER — BUPIVACAINE HCL (PF) 0.25 % IJ SOLN
INTRAMUSCULAR | Status: DC | PRN
  Administered 2019-05-13: 20 mL

## 2019-05-13 MED ORDER — FENTANYL CITRATE (PF) 100 MCG/2ML IJ SOLN: 25.0000 ug | INTRAMUSCULAR | Status: DC | PRN

## 2019-05-13 MED ORDER — FLEET ENEMA 7-19 GM/118ML RE ENEM: 1.0000 | ENEMA | Freq: Once | RECTAL | Status: DC | PRN

## 2019-05-13 SURGICAL SUPPLY — 75 items
BAG DECANTER FOR FLEXI CONT (MISCELLANEOUS) ×2 IMPLANT
BAND RUBBER #18 3X1/16 STRL (MISCELLANEOUS) ×2 IMPLANT
BENZOIN TINCTURE PRP APPL 2/3 (GAUZE/BANDAGES/DRESSINGS) ×2 IMPLANT
BLADE CLIPPER SURG (BLADE) IMPLANT
BLADE SURG 11 STRL SS (BLADE) IMPLANT
BUR CUTTER 7.0 ROUND (BURR) ×1 IMPLANT
BUR MATCHSTICK NEURO 3.0 LAGG (BURR) IMPLANT
CANISTER SUCT 3000ML PPV (MISCELLANEOUS) ×2 IMPLANT
CARTRIDGE OIL MAESTRO DRILL (MISCELLANEOUS) ×1 IMPLANT
CLSR STERI-STRIP ANTIMIC 1/2X4 (GAUZE/BANDAGES/DRESSINGS) ×1 IMPLANT
COVER WAND RF STERILE (DRAPES) ×2 IMPLANT
DERMABOND ADVANCED (GAUZE/BANDAGES/DRESSINGS) ×1
DERMABOND ADVANCED .7 DNX12 (GAUZE/BANDAGES/DRESSINGS) ×1 IMPLANT
DIFFUSER DRILL AIR PNEUMATIC (MISCELLANEOUS) ×2 IMPLANT
DRAPE LAPAROTOMY 100X72X124 (DRAPES) ×2 IMPLANT
DRAPE LAPAROTOMY T 102X78X121 (DRAPES) ×1 IMPLANT
DRAPE MICROSCOPE LEICA (MISCELLANEOUS) ×2 IMPLANT
DRAPE SURG 17X23 STRL (DRAPES) ×4 IMPLANT
DRSG OPSITE POSTOP 4X6 (GAUZE/BANDAGES/DRESSINGS) ×1 IMPLANT
ELECT REM PT RETURN 9FT ADLT (ELECTROSURGICAL) ×2
ELECTRODE REM PT RTRN 9FT ADLT (ELECTROSURGICAL) ×1 IMPLANT
EVACUATOR 1/8 PVC DRAIN (DRAIN) ×1 IMPLANT
GAUZE 4X4 16PLY RFD (DISPOSABLE) IMPLANT
GAUZE SPONGE 4X4 12PLY STRL (GAUZE/BANDAGES/DRESSINGS) ×2 IMPLANT
GAUZE SPONGE 4X4 16PLY XRAY LF (GAUZE/BANDAGES/DRESSINGS) ×1 IMPLANT
GLOVE BIO SURGEON STRL SZ 6.5 (GLOVE) ×2 IMPLANT
GLOVE BIOGEL PI IND STRL 6.5 (GLOVE) ×1 IMPLANT
GLOVE BIOGEL PI IND STRL 7.0 (GLOVE) IMPLANT
GLOVE BIOGEL PI IND STRL 7.5 (GLOVE) IMPLANT
GLOVE BIOGEL PI IND STRL 8.5 (GLOVE) IMPLANT
GLOVE BIOGEL PI INDICATOR 6.5 (GLOVE) ×1
GLOVE BIOGEL PI INDICATOR 7.0 (GLOVE) ×2
GLOVE BIOGEL PI INDICATOR 7.5 (GLOVE) ×2
GLOVE BIOGEL PI INDICATOR 8.5 (GLOVE) ×1
GLOVE ECLIPSE 8.5 STRL (GLOVE) ×1 IMPLANT
GLOVE ECLIPSE 9.0 STRL (GLOVE) ×2 IMPLANT
GLOVE SURG SS PI 7.0 STRL IVOR (GLOVE) ×3 IMPLANT
GOWN STRL REUS W/ TWL LRG LVL3 (GOWN DISPOSABLE) IMPLANT
GOWN STRL REUS W/ TWL XL LVL3 (GOWN DISPOSABLE) IMPLANT
GOWN STRL REUS W/TWL 2XL LVL3 (GOWN DISPOSABLE) ×1 IMPLANT
GOWN STRL REUS W/TWL LRG LVL3 (GOWN DISPOSABLE) ×1
GOWN STRL REUS W/TWL XL LVL3 (GOWN DISPOSABLE) ×2
HEMOSTAT POWDER KIT SURGIFOAM (HEMOSTASIS) ×5 IMPLANT
HEMOSTAT SURGICEL 2X14 (HEMOSTASIS) IMPLANT
KIT BASIN OR (CUSTOM PROCEDURE TRAY) ×2 IMPLANT
KIT TURNOVER KIT B (KITS) ×2 IMPLANT
NDL HYPO 25X1 1.5 SAFETY (NEEDLE) ×1 IMPLANT
NDL SPNL 20GX3.5 QUINCKE YW (NEEDLE) IMPLANT
NEEDLE HYPO 22GX1.5 SAFETY (NEEDLE) ×2 IMPLANT
NEEDLE HYPO 25X1 1.5 SAFETY (NEEDLE) ×2 IMPLANT
NEEDLE SPNL 20GX3.5 QUINCKE YW (NEEDLE) ×2 IMPLANT
NS IRRIG 1000ML POUR BTL (IV SOLUTION) ×2 IMPLANT
OIL CARTRIDGE MAESTRO DRILL (MISCELLANEOUS) ×2
PACK LAMINECTOMY NEURO (CUSTOM PROCEDURE TRAY) ×2 IMPLANT
PATTIES SURGICAL .5 X.5 (GAUZE/BANDAGES/DRESSINGS) IMPLANT
PATTIES SURGICAL .5 X3 (DISPOSABLE) ×2 IMPLANT
PATTIES SURGICAL 1X1 (DISPOSABLE) ×7 IMPLANT
SPONGE LAP 4X18 RFD (DISPOSABLE) IMPLANT
SPONGE SURGIFOAM ABS GEL 100 (HEMOSTASIS) ×1 IMPLANT
STAPLER VISISTAT 35W (STAPLE) ×2 IMPLANT
STRIP CLOSURE SKIN 1/2X4 (GAUZE/BANDAGES/DRESSINGS) ×2 IMPLANT
SUT BONE WAX W31G (SUTURE) IMPLANT
SUT ETHILON 4 0 PS 2 18 (SUTURE) ×1 IMPLANT
SUT NURALON 4 0 TR CR/8 (SUTURE) IMPLANT
SUT PROLENE 6 0 BV (SUTURE) IMPLANT
SUT VIC AB 0 CT1 18XCR BRD8 (SUTURE) IMPLANT
SUT VIC AB 0 CT1 8-18 (SUTURE) ×1
SUT VIC AB 2-0 CT1 18 (SUTURE) ×2 IMPLANT
SUT VIC AB 3-0 SH 8-18 (SUTURE) ×1 IMPLANT
SUT VICRYL 4-0 PS2 18IN ABS (SUTURE) IMPLANT
TOWEL GREEN STERILE (TOWEL DISPOSABLE) ×2 IMPLANT
TOWEL GREEN STERILE FF (TOWEL DISPOSABLE) ×2 IMPLANT
TRAY FOLEY MTR SLVR 16FR STAT (SET/KITS/TRAYS/PACK) ×1 IMPLANT
TUBE CONNECTING 12X1/4 (SUCTIONS) IMPLANT
WATER STERILE IRR 1000ML POUR (IV SOLUTION) ×2 IMPLANT

## 2019-05-13 NOTE — Transfer of Care (Signed)
Immediate Anesthesia Transfer of Care Note  Patient: KELDAN BOVAIRD  Procedure(s) Performed: THORACIC TEN LAMINECTOMY FOR TUMOR (N/A Spine Thoracic)  Patient Location: PACU  Anesthesia Type:General  Level of Consciousness: awake, alert  and oriented  Airway & Oxygen Therapy: Patient Spontanous Breathing and Patient connected to face mask oxygen  Post-op Assessment: Report given to RN, Post -op Vital signs reviewed and stable and Patient moving all extremities  Post vital signs: Reviewed and stable  Last Vitals:  Vitals Value Taken Time  BP 104/67 05/13/19 1535  Temp    Pulse 82 05/13/19 1544  Resp 9 05/13/19 1547  SpO2 100 % 05/13/19 1544  Vitals shown include unvalidated device data.  Last Pain:  Vitals:   05/13/19 1125  TempSrc:   PainSc: 0-No pain         Complications: No apparent anesthesia complications

## 2019-05-13 NOTE — Op Note (Signed)
Date of procedure: 05/13/2019  Date of dictation: Same  Service: Neurosurgery  Preoperative diagnosis: T10 metastatic spinal tumor, extradural involvement with myelopathy  Postoperative diagnosis: Same      Procedure Name: T9-10 laminectomy for epidural tumor  Left T9 transpedicular resection of epidural tumor, microdissection  Surgeon:Anaia Frith A.Nabiha Planck, M.D.  Asst. Surgeon: Ellene Route  Anesthesia: General  Indication: 64 year old male presents with bilateral lower extremity weakness and sensory loss progressively worsening over time.  Work-up demonstrates evidence of multifocal spinal metastatic disease with unknown primary.  At T10 the patient has marked involvement of his left-sided posterior elements, pedicle and vertebral body with severe epidural tumor extension and marked compression of the spinal cord.  Patient presents now for decompressive surgery in hopes of improving his symptoms.  Operative note: After induction anesthesia, patient position prone onto bolsters and appropriate padded.  Patient's thoracic and lumbar region prepped and draped sterilely.  Incision made overlying T10.  Dissection performed bilaterally.  Retractor placed.  X-ray taken.  Level confirmed.  Decompressive laminectomy of T10 was then performed using Leksell rongeurs Kerrison rongeurs and the high-speed drill.  The inferior aspect of T9 was also resected as was the ligamentum flavum at T9-10 and T10-11.  There is a large amount of epidural tumor dorsolaterally on the left side.  This was dissected free and removed.  The tumor itself was extremely vascular and bled heavily throughout the case.  The thecal sac was completely decompressed dorsal laterally.  The left-sided T10 pedicle had been involved with tumor.  This was further resected.  Decompression then proceeded to the vertebral body of T10.  There was tumor extending along the chest wall including the rib head and rib of T10.  This was debulked greatly.  During  this time hemostasis was difficult to achieve.  Eventually with packing and multiple applications of Surgifoam hemostasis was achieved.  At this point a very thorough decompression had been achieved.  There was no evidence of injury to the thecal sac or nerve roots.  Wound is then irrigated one final time.  Gelfoam was left in the epidural space.  A medium Hemovac drain was left in the epidural space.  Wounds and closed in layers with Vicryl sutures.  Steri-Strips and sterile dressing were applied.  No apparent complications.  Patient tolerated the procedure reasonably well and returned to the recovery room postop.

## 2019-05-13 NOTE — Anesthesia Procedure Notes (Signed)
Procedure Name: Intubation Date/Time: 05/13/2019 12:28 PM Performed by: Amadeo Garnet, CRNA Pre-anesthesia Checklist: Patient identified, Emergency Drugs available, Suction available and Patient being monitored Patient Re-evaluated:Patient Re-evaluated prior to induction Oxygen Delivery Method: Circle system utilized Preoxygenation: Pre-oxygenation with 100% oxygen Induction Type: IV induction Ventilation: Mask ventilation without difficulty Laryngoscope Size: Glidescope and 4 Grade View: Grade I Tube type: Oral Tube size: 7.0 mm Number of attempts: 1 Airway Equipment and Method: Stylet and Video-laryngoscopy Placement Confirmation: ETT inserted through vocal cords under direct vision,  positive ETCO2 and breath sounds checked- equal and bilateral Secured at: 22 cm Tube secured with: Tape Dental Injury: Teeth and Oropharynx as per pre-operative assessment

## 2019-05-13 NOTE — Telephone Encounter (Signed)
Who Is Calling Patient / Member / Family / Caregiver Caller Name Tyrone Washington Caller Phone Number [phone] Patient Name Tyrone Washington Patient DOB [birth year] Call Type Message Only Information Provided Reason for Call Request for General Office Information Initial Comment Caller states needs to discuss disability with provider. Following up with provider. Caller has been out of work since October 2020. Has been one STD; Dr Garnette Czech provided a note employer indicating that he will be out of work until further notice. He was sent to ER at [facility]; not sure if he'll be making appt tomorrow as scheduled, caller stated would follow up in morning. Additional Comment Caller declined to speak to after hours nurse re: any symptoms. Asked to leave message for [name] and stated would call back in morning. Currently at ER.

## 2019-05-13 NOTE — Anesthesia Preprocedure Evaluation (Addendum)
Anesthesia Evaluation  Patient identified by MRN, date of birth, ID band Patient awake    Reviewed: Allergy & Precautions, H&P , NPO status , Patient's Chart, lab work & pertinent test results  Airway Mallampati: II   Neck ROM: full   Comment: Large anterior neck mass.   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  Soft, no apparent airway compromise.  Pt with non-labored breathing.  Can lay flat without difficulty.    Renal/GU      Musculoskeletal  (+) Arthritis ,   Abdominal   Peds  Hematology  (+) anemia ,   Anesthesia Other Findings   Reproductive/Obstetrics                            Anesthesia Physical Anesthesia Plan  ASA: III  Anesthesia Plan: General   Post-op Pain Management:    Induction: Intravenous  PONV Risk Score and Plan: 1 and Ondansetron, Dexamethasone, Midazolam and Treatment may vary due to age or medical condition  Airway Management Planned: Oral ETT and Video Laryngoscope Planned  Additional Equipment:   Intra-op Plan:   Post-operative Plan: Extubation in OR  Informed Consent: I have reviewed the patients History and Physical, chart, labs and discussed the procedure including the risks, benefits and alternatives for the proposed anesthesia with the patient or authorized representative who has indicated his/her understanding and acceptance.       Plan Discussed with: CRNA, Anesthesiologist and Surgeon  Anesthesia Plan Comments:        Anesthesia Quick Evaluation

## 2019-05-13 NOTE — Brief Op Note (Signed)
05/13/2019  3:03 PM  PATIENT:  Tyrone Washington  64 y.o. male  PRE-OPERATIVE DIAGNOSIS:  Thoracic ten metastatic spinal tumor.  Epidural extension.  POST-OPERATIVE DIAGNOSIS:  Thoracic ten metastatic spinal tumor.  Epidural extension.  PROCEDURE:  Procedure(s): THORACIC TEN LAMINECTOMY FOR TUMOR (N/A)  SURGEON:  Surgeon(s) and Role:    * Emin Foree, Mallie Mussel, MD - Primary    Kristeen Miss, MD - Assisting  PHYSICIAN ASSISTANT:   ASSISTANTS:    ANESTHESIA:   general  EBL:  3500 mL   BLOOD ADMINISTERED: 2 units packed cells  DRAINS: Medium Hemovac drain in the epidural space  LOCAL MEDICATIONS USED:  MARCAINE     SPECIMEN:  Source of Specimen:  T10 epidural tumor  DISPOSITION OF SPECIMEN:  PATHOLOGY  COUNTS:  YES  TOURNIQUET:  * No tourniquets in log *  DICTATION: .Dragon Dictation  PLAN OF CARE: Admit to inpatient   PATIENT DISPOSITION:  PACU - hemodynamically stable.   Delay start of Pharmacological VTE agent (>24hrs) due to surgical blood loss or risk of bleeding: yes

## 2019-05-14 ENCOUNTER — Encounter: Payer: Self-pay | Admitting: *Deleted

## 2019-05-14 ENCOUNTER — Ambulatory Visit
Admit: 2019-05-14 | Discharge: 2019-05-14 | Disposition: A | Discharge status: Home / Self Care | Payer: BC Managed Care – PPO | Attending: Radiation Oncology | Admitting: Radiation Oncology

## 2019-05-14 ENCOUNTER — Other Ambulatory Visit: Payer: Self-pay | Admitting: Radiation Therapy

## 2019-05-14 DIAGNOSIS — D72828 Other elevated white blood cell count: Secondary | ICD-10-CM | POA: Diagnosis not present

## 2019-05-14 DIAGNOSIS — C7949 Secondary malignant neoplasm of other parts of nervous system: Secondary | ICD-10-CM

## 2019-05-14 DIAGNOSIS — G9520 Unspecified cord compression: Secondary | ICD-10-CM | POA: Diagnosis not present

## 2019-05-14 DIAGNOSIS — E8809 Other disorders of plasma-protein metabolism, not elsewhere classified: Secondary | ICD-10-CM | POA: Diagnosis not present

## 2019-05-14 DIAGNOSIS — E46 Unspecified protein-calorie malnutrition: Secondary | ICD-10-CM | POA: Diagnosis not present

## 2019-05-14 DIAGNOSIS — T380X5A Adverse effect of glucocorticoids and synthetic analogues, initial encounter: Secondary | ICD-10-CM | POA: Diagnosis not present

## 2019-05-14 DIAGNOSIS — C801 Malignant (primary) neoplasm, unspecified: Secondary | ICD-10-CM

## 2019-05-14 DIAGNOSIS — R918 Other nonspecific abnormal finding of lung field: Secondary | ICD-10-CM | POA: Diagnosis not present

## 2019-05-14 DIAGNOSIS — I2699 Other pulmonary embolism without acute cor pulmonale: Secondary | ICD-10-CM

## 2019-05-14 DIAGNOSIS — C73 Malignant neoplasm of thyroid gland: Secondary | ICD-10-CM | POA: Diagnosis not present

## 2019-05-14 DIAGNOSIS — G959 Disease of spinal cord, unspecified: Secondary | ICD-10-CM | POA: Diagnosis not present

## 2019-05-14 DIAGNOSIS — F17210 Nicotine dependence, cigarettes, uncomplicated: Secondary | ICD-10-CM

## 2019-05-14 DIAGNOSIS — C7951 Secondary malignant neoplasm of bone: Secondary | ICD-10-CM | POA: Diagnosis not present

## 2019-05-14 DIAGNOSIS — D62 Acute posthemorrhagic anemia: Secondary | ICD-10-CM | POA: Diagnosis not present

## 2019-05-14 DIAGNOSIS — E0789 Other specified disorders of thyroid: Secondary | ICD-10-CM | POA: Diagnosis not present

## 2019-05-14 DIAGNOSIS — R739 Hyperglycemia, unspecified: Secondary | ICD-10-CM | POA: Diagnosis not present

## 2019-05-14 LAB — BASIC METABOLIC PANEL
Anion gap: 8 (ref 5–15)
BUN: 13 mg/dL (ref 8–23)
CO2: 21 mmol/L — ABNORMAL LOW (ref 22–32)
Calcium: 7.9 mg/dL — ABNORMAL LOW (ref 8.9–10.3)
Chloride: 108 mmol/L (ref 98–111)
Creatinine, Ser: 0.93 mg/dL (ref 0.61–1.24)
GFR calc Af Amer: >60 mL/min (ref >60)
GFR calc non Af Amer: >60 mL/min (ref >60)
Glucose, Bld: 158 mg/dL — ABNORMAL HIGH (ref 70–99)
Potassium: 4.3 mmol/L (ref 3.5–5.1)
Sodium: 137 mmol/L (ref 135–145)

## 2019-05-14 LAB — HEMOGLOBIN AND HEMATOCRIT, BLOOD
HCT: 24.2 % — ABNORMAL LOW (ref 39.0–52.0)
HCT: 24.4 % — ABNORMAL LOW (ref 39.0–52.0)
Hemoglobin: 8 g/dL — ABNORMAL LOW (ref 13.0–17.0)
Hemoglobin: 8 g/dL — ABNORMAL LOW (ref 13.0–17.0)

## 2019-05-14 MED FILL — Thrombin For Soln 5000 Unit: CUTANEOUS | Qty: 5000 | Status: AC

## 2019-05-14 NOTE — Progress Notes (Signed)
Inpatient Rehab Admissions:  Inpatient Rehab Consult received.  I met with pt at the bedside for rehabilitation assessment and to discuss goals and expectations of an inpatient rehab admission. We discussed program details, estimated LOS, and expected functional outcomes. Pt interested in rehab at this time and agrees he would benefit. We discussed that I will need to wait for OT assessment to ensure he has therapy needs from two disciplines prior to beginning his insurance authorization process.   Pt gave me permission to speak with his sister to confirm intermittant DC support. Await call back from sister to confirm and await completion of OT eval.   Will continue to follow.   Kelly Gentry, OTR/L  Rehab Admissions Coordinator  [phone] 05/14/2019 2:30 PM   

## 2019-05-14 NOTE — Evaluation (Addendum)
Occupational Therapy Evaluation Patient Details Name: Tyrone Washington MRN: [id number] DOB: 09-05-1955 Today's Date: 05/14/2019    History of Present Illness Patient is a 64 y/o male admitted with falling, numbness in LE's.  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  Now s/p T9-10 laminectomy for epidural tumor and Left T9 transpedicular resection of epidural tumor, microdissection   Clinical Impression   This 64 y/o male presents with the above. PTA pt reports independence-mod independence with ADL and functional mobility, living alone; reports multiple falls at home. Pt very pleasant and willing to work with therapy this session. Pt currently requiring minA for functional mobility using RW in room, requiring increased time and cues for safe navigation using RW as pt noted with discoordinated LE movements. He tolerated standing grooming ADL with minA for balance, min-modA for LB and toileting ADL. HR up to the 120s and briefly to 130 with standing activity. He will benefit from continued acute OT services and currently recommend CIR level therapies at time of discharge to maximize his safety and independence with ADL/mobility. Will follow.     Follow Up Recommendations  CIR;Supervision/Assistance - 24 hour    Equipment Recommendations  Other (comment);Tub/shower seat(TBD)           Precautions / Restrictions Precautions Precautions: Fall Precaution Comments: multiple falls at home Restrictions Weight Bearing Restrictions: No      Mobility Bed Mobility               General bed mobility comments: OOB in bathroom upon arrival  Transfers Overall transfer level: Needs assistance Equipment used: Rolling walker (2 wheeled) Transfers: Sit to/from Stand Sit to Stand: Min assist         General transfer comment: boosting assist from toilet, assist for balance, cues for hand placement    Balance Overall balance assessment:  Needs assistance;History of Falls Sitting-balance support: Feet supported Sitting balance-Leahy Scale: Fair     Standing balance support: Bilateral upper extremity supported Standing balance-Leahy Scale: Poor Standing balance comment: UE support for standing balance, min A for dynamic balance                           ADL either performed or assessed with clinical judgement   ADL Overall ADL's : Needs assistance/impaired Eating/Feeding: Modified independent;Sitting   Grooming: Minimal assistance;Standing;Wash/dry face;Oral care Grooming Details (indicate cue type and reason): assist for standing balance Upper Body Bathing: Set up;Min guard;Sitting   Lower Body Bathing: Minimal assistance;Sit to/from stand   Upper Body Dressing : Set up;Min guard;Sitting   Lower Body Dressing: Minimal assistance;Moderate assistance;Sit to/from stand   Toilet Transfer: Minimal assistance;Ambulation;RW;Cueing for sequencing;Cueing for safety   Toileting- Clothing Manipulation and Hygiene: Minimal assistance;Sit to/from stand       Functional mobility during ADLs: Minimal assistance;Rolling walker General ADL Comments: cues for sequencing RW use with mobility tasks                         Pertinent Vitals/Pain Pain Assessment: Faces Faces Pain Scale: Hurts a little bit Pain Location: feet Pain Descriptors / Indicators: Tingling;Numbness Pain Intervention(s): Limited activity within patient's tolerance;Monitored during session;Repositioned     Hand Dominance     Extremity/Trunk Assessment Upper Extremity Assessment Upper Extremity Assessment: Overall WFL for tasks assessed   Lower Extremity Assessment Lower Extremity Assessment: Defer to PT evaluation       Communication  Communication Communication: No difficulties   Cognition Arousal/Alertness: Awake/alert Behavior During Therapy: WFL for tasks assessed/performed Overall Cognitive Status: Within Functional  Limits for tasks assessed                                     General Comments  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, HR up to 120s and briefly to 130 with standing activity    Exercises     Shoulder Instructions      Home Living Family/patient expects to be discharged to:: Private residence Living Arrangements: Alone   Type of Home: House Home Access: Stairs to enter [employer] of Steps: "couple" Entrance Stairs-Rails: Right Home Layout: One level     Bathroom Shower/Tub: Teacher, early years/pre: Standard     Home Equipment: Cane - single point          Prior Functioning/Environment Level of Independence: Independent;Independent with assistive device(s)        Comments: walks with cane, multiple recent falls        OT Problem List: Decreased strength;Decreased range of motion;Decreased activity tolerance;Impaired balance (sitting and/or standing);Decreased safety awareness;Decreased coordination;Decreased knowledge of use of DME or AE;Decreased knowledge of precautions;Pain      OT Treatment/Interventions: Self-care/ADL training;Therapeutic exercise;Energy conservation;DME and/or AE instruction;Therapeutic activities;Patient/family education;Balance training;Neuromuscular education    OT Goals(Current goals can be found in the care plan section) Acute Rehab OT Goals Patient Stated Goal: to be independent OT Goal Formulation: With patient Time For Goal Achievement: 05/28/19 Potential to Achieve Goals: Good  OT Frequency: Min 2X/week   Barriers to D/C:            Co-evaluation              AM-PAC OT "6 Clicks" Daily Activity     Outcome Measure Help from another person eating meals?: None Help from another person taking care of personal grooming?: A Little Help from another person toileting, which includes using toliet, bedpan, or urinal?: A Little Help from another person bathing (including washing, rinsing,  drying)?: A Lot Help from another person to put on and taking off regular upper body clothing?: A Little Help from another person to put on and taking off regular lower body clothing?: A Lot 6 Click Score: 17   End of Session Equipment Utilized During Treatment: Rolling walker Nurse Communication: Mobility status  Activity Tolerance: Patient tolerated treatment well Patient left: in chair;with call bell/phone within reach;with chair alarm set  OT Visit Diagnosis: Other abnormalities of gait and mobility (R26.89);History of falling (Z91.81);Muscle weakness (generalized) (M62.81)                Time: [id number] OT Time Calculation (min): 20 min Charges:  OT General Charges $OT Visit: 1 Visit OT Evaluation $OT Eval Moderate Complexity: 1 Mod  Lou Cal, OT Acute Rehabilitation Services Pager [phone] Office [phone]   Raymondo Band 05/14/2019, 4:10 PM

## 2019-05-14 NOTE — Progress Notes (Signed)
Postop day 1.  Overall patient doing well overnight.  Pain well controlled.  Lower extremity strength and sensation much improved.  He is afebrile.  Heart rate is normal.  Blood pressure acceptable.  Urine output is good.  He is awake and alert.  He is oriented and appropriate.  Cranial nerve function normal bilateral.  Motor examination 5/5 in both upper extremities.  Lower extremities 4+/5 with spasticity but significantly improved from preop.  Hematocrit gradually equilibrating.  Currently 24.2.  Overall doing well following thoracic decompression and resection of epidural metastatic tumor.  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  Plan to mobilize today.  Inpatient rehab consult.  Plan for radiation therapy consult and oncology consult.

## 2019-05-14 NOTE — Consult Note (Signed)
Radiation Oncology         [phone]) [phone] ________________________________  Name: Tyrone Washington        MRN: [id number]  Date of Service: 05/14/19  DOB: February 02, 1956  CC:O'Sullivan, Lenna Sciara, NP    REFERRING PHYSICIAN: Dr. Annette Stable  DIAGNOSIS: The primary encounter diagnosis was Bilateral leg numbness. Diagnoses of Malignant neoplasm metastatic to bone [facility]) and Elective surgery were also pertinent to this visit.   HISTORY OF PRESENT ILLNESS: Tyrone Washington is a 64 y.o. male seen at the request of Dr. Annette Stable for a newly noted thoracic spine cord compression.  The patient has a history of a nodular goiter dating back about 10 years ago.  He has had multiple assessments of this and apparently had a hemithyroidectomy in 2010.  The patient shared his operative note and pathology report with Dr. Hendricks Limes when he was seen earlier this past month and it appeared he had had a hemithyroidectomy and frozen section was negative for malignancy but showed a follicular lesion.  He apparently had a complicated postoperative recovery including hematoma hematoma that had to be evacuated. 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  It sounds as though the patient had been evaluated on several occasions but has not had routine follow-up care.  He had slowly started noticing changes in the growth of this slowly over time and had been wanting to have procedures for orthopedic issues but he was sent to ENT for evaluation and was seen on 04/25/19 by Dr. Hendricks Limes to discuss thyroidectomy. He had a CT neck on 05/07/19 that revealed the right lobe of the thyroid measuring 4.8 x 6.8 x 7.1 cm, and enhancing thyroid tissue in the right paratracheal region, and the left lobe measuring 5.7 x 7.8 x 8.7 cm. No adenopathy in the neck was noted. In the midst of getting set up for surgical resection, he had also been experiencing progressive weakness from the waist down.   Apparently the symptoms started in October of last year, he has had multiple falls and has been still trying to work in the midst of this.  He was being referred to neurology by his primary provider for assessment of this, upon evaluation on 05/12/19, Dr. Posey Pronto, the neurologist seeing him was concerned that he may have symptoms of spinal cord compression.  He was sent urgently to the emergency room and underwent MRI of the thoracic lumbar and cervical spine.  No abnormal findings suggesting malignancy were noted in the cervical spine. In the thoracic spine however he had hyperintensity and enhancement at T7 extending to the posterior elements on the left with additional involvement of the inferior left aspect of the T6 vertebral body, extraosseous extension at T7 including the ventral and left lateral spinal canal effacement of the left lateral subarachnoid space without cord compression and effacement of the left T7-T8 foramen and partial effacement at T6-T7 foramen.  There was hyperintensity and enhancement at T10 extending into the left greater than right elements posteriorly involving the left 10th rib and involving the left aspect of T9 and posterior elements with significant extraosseous extension inferior at T9 and T10 including left ventral lateral and dorsal epidural extension and cord displacement with compression with likely abnormal cord signal.  Further distal imaging of the lumbar spine revealed a 13 mm lesion in the posterior inferior aspect of the S1 sacral segment with T1 hypointensity and avid enhancement as well as a 15 mm  lesion in the right sacral alla, mild degenerative endplate irregularities from L5-S1, and no evidence of abnormality in the distal aspect of his spinal cord.  Given the symptoms he was having which sound to have been progressive weakness, numbness, difficulty ambulating with leg stiffness and ataxia, he was taken urgently to the operating room for spinal cord decompression  including thoracic level 10 laminectomy on 05/13/2019.  Final pathology is pending at this time.  We are asked to contact the patient to discuss recommendations for postoperative radiotherapy.  He has met with Dr. Alen Blew, who has recommended systemic therapy once the primary diagnosis of malignancy is confirmed.    PREVIOUS RADIATION THERAPY: No   PAST MEDICAL HISTORY:  Past Medical History:  Diagnosis Date   Allergy    allergic rhinitis   Arthritis    B12 deficiency 04/10/2019   History of chicken pox    Hyperlipidemia    no meds now       PAST SURGICAL HISTORY: Past Surgical History:  Procedure Laterality Date   COLONOSCOPY     THYROID SURGERY     "Years ago"     FAMILY HISTORY:  Family History  Problem Relation Age of Onset   Heart disease Father        patient is unsure of history   CVA Father    Heart attack Neg Hx    Colon cancer Neg Hx    Esophageal cancer Neg Hx    Rectal cancer Neg Hx    Stomach cancer Neg Hx      SOCIAL HISTORY:  reports that he has been smoking. He has never used smokeless tobacco. He reports current alcohol use of about 7.0 standard drinks of alcohol per week. He reports that he does not use drugs.   ALLERGIES: Patient has no known allergies.   MEDICATIONS:  Current Facility-Administered Medications  Medication Dose Route Frequency Provider Last Rate Last Admin   0.9 %  sodium chloride infusion  250 mL Intravenous PRN Earnie Larsson, MD       0.9 %  sodium chloride infusion  250 mL Intravenous Continuous Pool, Mallie Mussel, MD       0.9 %  sodium chloride infusion   Intravenous Continuous Ashok Pall, MD 100 mL/hr at 05/14/19 1508 New Bag at 05/14/19 1508   acetaminophen (TYLENOL) tablet 650 mg  650 mg Oral Q6H PRN Earnie Larsson, MD       Or   acetaminophen (TYLENOL) suppository 650 mg  650 mg Rectal Q6H PRN Earnie Larsson, MD       bisacodyl (DULCOLAX) suppository 10 mg  10 mg Rectal Daily PRN Earnie Larsson, MD        Chlorhexidine Gluconate Cloth 2 % PADS 6 each  6 each Topical Daily Earnie Larsson, MD   6 each at 05/14/19 0848   dexamethasone (DECADRON) injection 10 mg  10 mg Intravenous Q6H Earnie Larsson, MD   10 mg at 05/14/19 1234   diazepam (VALIUM) tablet 5-10 mg  5-10 mg Oral Q6H PRN Earnie Larsson, MD       HYDROcodone-acetaminophen (NORCO/VICODIN) 5-325 MG per tablet 1-2 tablet  1-2 tablet Oral Q4H PRN Earnie Larsson, MD       HYDROmorphone (DILAUDID) injection 1 mg  1 mg Intravenous Q2H PRN Earnie Larsson, MD       iohexol (OMNIPAQUE) 350 MG/ML injection 75 mL  75 mL Intravenous Once PRN Earnie Larsson, MD       menthol-cetylpyridinium (CEPACOL) lozenge 3 mg  1  lozenge Oral PRN Earnie Larsson, MD       Or   phenol (CHLORASEPTIC) mouth spray 1 spray  1 spray Mouth/Throat PRN Earnie Larsson, MD       ondansetron [facility]) tablet 4 mg  4 mg Oral Q6H PRN Earnie Larsson, MD       Or   ondansetron [facility]) injection 4 mg  4 mg Intravenous Q6H PRN Earnie Larsson, MD       oxyCODONE (Oxy IR/ROXICODONE) immediate release tablet 10 mg  10 mg Oral Q3H PRN Earnie Larsson, MD       phenylephrine (NEOSYNEPHRINE) 10-0.9 MG/250ML-% infusion  0-400 mcg/min Intravenous Titrated Albertha Ghee, MD   Stopped at 05/14/19 1510   polyethylene glycol (MIRALAX / GLYCOLAX) packet 17 g  17 g Oral Daily PRN Earnie Larsson, MD       sodium chloride flush (NS) 0.9 % injection 3 mL  3 mL Intravenous Q12H Earnie Larsson, MD   3 mL at 05/14/19 0828   sodium chloride flush (NS) 0.9 % injection 3 mL  3 mL Intravenous PRN Earnie Larsson, MD       sodium chloride flush (NS) 0.9 % injection 3 mL  3 mL Intravenous Q12H Earnie Larsson, MD   3 mL at 05/14/19 7116   sodium chloride flush (NS) 0.9 % injection 3 mL  3 mL Intravenous PRN Earnie Larsson, MD       sodium phosphate (FLEET) 7-19 GM/118ML enema 1 enema  1 enema Rectal Once PRN Earnie Larsson, MD         REVIEW OF SYSTEMS: On review of systems the patient reports that he is doing quite well overall.  He states  that he is able to feel his feet now and has been able to wiggle his toes and actually able to move his legs much more so than prior to surgery.  He states that he is anticipating rehabilitation prior to hospital discharge.  He is also interested in talking with social work to determine his options for disability.  He denies any difficulty with bowel or bladder function, shortness of breath or chest pain.  He denies any significant trouble swallowing regarding his thyroid hypertrophy.  No other complaints are verbalized    PHYSICAL EXAM:  Wt Readings from Last 3 Encounters:  05/13/19 158 lb 15.2 oz (72.1 kg)  05/12/19 160 lb (72.6 kg)  04/08/19 161 lb (73 kg)   Temp Readings from Last 3 Encounters:  05/14/19 99 F (37.2 C) (Oral)  04/08/19 (!) 96.5 F (35.8 C) (Oral)  03/25/19 (!) 97.5 F (36.4 C) (Temporal)   BP Readings from Last 3 Encounters:  05/14/19 102/69  05/12/19 103/68  04/08/19 108/71   Pulse Readings from Last 3 Encounters:  05/14/19 85  05/12/19 84  04/08/19 84   Pain Assessment Pain Score: 0-No pain/10  Unable to examine due to encounter type.  ECOG = 2  0 - Asymptomatic (Fully active, able to carry on all predisease activities without restriction)  1 - Symptomatic but completely ambulatory (Restricted in physically strenuous activity but ambulatory and able to carry out work of a light or sedentary nature. For example, light housework, office work)  2 - Symptomatic, <50% in bed during the day (Ambulatory and capable of all self care but unable to carry out any work activities. Up and about more than 50% of waking hours)  3 - Symptomatic, >50% in bed, but not bedbound (Capable of only limited self-care, confined to bed or chair 50%  or more of waking hours)  4 - Bedbound (Completely disabled. Cannot carry on any self-care. Totally confined to bed or chair)  5 - Death   Eustace Pen MM, Creech RH, Tormey DC, et al. [phone]). "Toxicity and response criteria of the  [facility]". [place] Oncol. 5 (6): 649-55    LABORATORY DATA:  Lab Results  Component Value Date   WBC 16.6 (H) 05/13/2019   HGB 8.0 (L) 05/14/2019   HCT 24.4 (L) 05/14/2019   MCV 89.8 05/13/2019   PLT 300 05/13/2019   Lab Results  Component Value Date   NA 137 05/14/2019   K 4.3 05/14/2019   CL 108 05/14/2019   CO2 21 (L) 05/14/2019   Lab Results  Component Value Date   ALT 12 05/12/2019   AST 19 05/12/2019   ALKPHOS 55 05/12/2019   BILITOT 0.5 05/12/2019      RADIOGRAPHY: DG THORACOLUMABAR SPINE  Result Date: 05/13/2019 CLINICAL DATA:  T10 laminectomy. EXAM: THORACOLUMBAR SPINE 1V COMPARISON:  May 12, 2019. FINDINGS: Two intraoperative cross-table lateral projections were obtained of the lumbar spine. The first image demonstrates surgical probe directed toward T12 level. The second image demonstrates surgical retractor posterior to T10-11 disc space. IMPRESSION: Surgical localization as described above. Electronically Signed   By: Marijo Conception M.D.   On: 05/13/2019 16:25   CT CHEST W CONTRAST  Result Date: 05/13/2019 CLINICAL DATA:  65 year old male with history of cancer of unknown primary. EXAM: CT CHEST, ABDOMEN, AND PELVIS WITH CONTRAST TECHNIQUE: Multidetector CT imaging of the chest, abdomen and pelvis was performed following the standard protocol during bolus administration of intravenous contrast. CONTRAST:  160m OMNIPAQUE IOHEXOL 300 MG/ML  SOLN COMPARISON:  No priors. FINDINGS: CT CHEST FINDINGS Cardiovascular: Heart size is normal. There is no significant pericardial fluid, thickening or pericardial calcification. No atherosclerotic calcifications in the thoracic aorta or the coronary arteries. Filling defects are present within segmental and subsegmental sized pulmonary artery branches in the lungs bilaterally, indicative of pulmonary embolism. The largest of these is in the right lower lobe (axial image 39 of series 3). At this time,  these appear to be nonocclusive. Mediastinum/Nodes: Large heterogeneously enhancing thyroid mass incompletely imaged at the level of the thoracic inlet measuring at least 11.8 x 9.8 cm (axial image 1 of series 3). No pathologically enlarged mediastinal or hilar lymph nodes. Esophagus is unremarkable in appearance. No axillary lymphadenopathy. Lungs/Pleura: Multiple small pulmonary nodules are scattered throughout the lungs bilaterally, highly concerning for widespread metastatic disease, largest of which measure up to 9 mm in the left lower lobe (axial image 122 of series 5, and 11 mm in the right lower lobe (axial image 121 of series 5). Small right pleural effusion which is partially loculated laterally, but predominantly layers dependently. No left pleural effusion. No acute consolidative airspace disease. Musculoskeletal: Lytic lesions are noted in the thoracic spine involving both T7 and T10. The largest lesion involves the posterior aspect of the T10 vertebral body, extending through the pedicle into the lateral and posterior elements, as well as involving the adjacent head and neck of the left tenth rib, measuring 5.1 x 3.8 cm (axial image 102 of series 5), encroaching upon and significantly narrowing the central spinal canal. The smaller lesion is at T7 measuring approximately 3.9 x 2.7 cm also involving the left side of the vertebral body extending into the pedicle and left transverse process, as well as the left paraspinal soft tissues with minimal encroachment upon  the central spinal canal. Both of these lesions demonstrate extensive enhancement. CT ABDOMEN PELVIS FINDINGS Hepatobiliary: No suspicious cystic or solid hepatic lesions. No intra or extrahepatic biliary ductal dilatation. Gallbladder is normal in appearance. Pancreas: No pancreatic mass. No pancreatic ductal dilatation. No pancreatic or peripancreatic fluid collections or inflammatory changes. Spleen: Unremarkable. Adrenals/Urinary Tract:  Subcentimeter low-attenuation lesion in the interpolar region of the right kidney, too small to characterize, but statistically likely to represent a tiny cyst. Left kidney and bilateral adrenal glands are normal in appearance. No hydroureteronephrosis. Urinary bladder is normal in appearance. Stomach/Bowel: Normal appearance of the stomach. No pathologic dilatation of small bowel or colon. Normal appendix. Vascular/Lymphatic: Atherosclerotic calcifications in the pelvic vasculature. No aneurysm or dissection noted in the abdominal or pelvic vasculature. No lymphadenopathy noted in the abdomen or pelvis. Reproductive: Prostate gland and seminal vesicles are unremarkable. Other: No significant volume of ascites.  No pneumoperitoneum. Musculoskeletal: There are no aggressive appearing lytic or blastic lesions noted in the visualized portions of the skeleton. IMPRESSION: 1. Widespread metastatic disease to the lungs, small right pleural effusion which is partially loculated and may be malignant, and metastatic lesions in the thoracic spine, as detailed above. A primary source is uncertain, however, the lesions of the thoracic spine demonstrate avid enhancement, which is very similar to the enhancement characteristics of the large thyroid mass. 2. Multiple segmental and subsegmental sized filling defects in the pulmonary arteries bilaterally, compatible with pulmonary embolism. 3. Large heterogeneously enhancing thyroid mass incompletely imaged. Although this may simply represent a thyroid goiter, the possibility of primary thyroid neoplasm should be considered, particularly in light of the similar enhancement characteristics of the spinal lesions (discussed above). 4. Additional incidental findings, as above. Electronically Signed   By: Vinnie Langton M.D.   On: 05/13/2019 08:58   MR Cervical Spine W or Wo Contrast  Result Date: 05/12/2019 CLINICAL DATA:  Bilateral lower extremity weakness, abnormal lumbar spine  EXAM: MRI CERVICAL SPINE WITHOUT AND WITH CONTRAST TECHNIQUE: Multiplanar and multiecho pulse sequences of the cervical spine, to include the craniocervical junction and cervicothoracic junction, were obtained without and with intravenous contrast. CONTRAST:  11m GADAVIST GADOBUTROL 1 MMOL/ML IV SOLN COMPARISON:  None. FINDINGS: Alignment: Anteroposterior alignment is maintained. Vertebrae: Vertebral body heights are preserved. There is no substantial marrow edema or suspicious osseous lesion. Cord: No abnormal signal. Posterior Fossa, vertebral arteries, paraspinal tissues: Markedly enlarged and heterogeneous thyroid is incompletely evaluated. Disc levels: Minor disc bulges are present. Mild facet and uncovertebral hypertrophy. There is no high-grade degenerative stenosis. IMPRESSION: No evidence cervical spine metastatic disease. Electronically Signed   By: PMacy MisM.D.   On: 05/12/2019 21:50   MR THORACIC SPINE W WO CONTRAST  Result Date: 05/12/2019 CLINICAL DATA:  Lower extremity weakness and numbness, abnormal lumbar spine EXAM: MRI THORACIC WITHOUT AND WITH CONTRAST TECHNIQUE: Multiplanar and multiecho pulse sequences of the thoracic spine were obtained without and with intravenous contrast. CONTRAST:  764mGADAVIST GADOBUTROL 1 MMOL/ML IV SOLN COMPARISON:  None. FINDINGS: Motion artifact is present. Alignment: Thoracic kyphosis is preserved. No substantial anteroposterior listhesis. Vertebrae: There is abnormal STIR hyperintensity and enhancement at the T7 level extending into the posterior elements on the left. Additional involvement of the inferior left aspect of the T6 vertebral body. There is extraosseous extension at T7 including the ventral and left lateral spinal canal. Effacement of the left lateral subarachnoid space without cord compression. There is also effacement of the left T7-T8 foramen and partial effacement of the  left T6-T7 foramen. Abnormal STIR hyperintensity and enhancement at  the T10 level extending into the left much greater than right posterior elements with involvement of the left tenth rib. Additional involvement of the left aspect of the T9 vertebral body and posterior elements. Significant extraosseous extension at inferior T9 and T10 including left ventral, lateral, and dorsal epidural extension with cord displacement and compression. There is likely abnormal cord signal. Effacement of the left T9-T10 and T10-T11 foramina. Cord:  As above.  Otherwise unremarkable. Paraspinal and other soft tissues: Partially imaged significantly enlarged and heterogeneous thyroid is incompletely evaluated. Disc levels: Mild degenerative disc disease and facet arthropathy. There is no high-grade degenerative stenosis. IMPRESSION: Abnormal signal at T7 and T10 with additional less pronounced adjacent involvement at T6 and T9 consistent with metastatic disease. Extraosseous extension is present at both levels as detailed above. Most notably, there is severe canal stenosis with cord compression and abnormal cord signal at the inferior T9 and T10 level. These results were called by telephone at the time of interpretation on 05/12/2019 at 9:43 pm to provider Madilyn Hook, who verbally acknowledged these results. Electronically Signed   By: Macy Mis M.D.   On: 05/12/2019 21:44   MR Lumbar Spine W Wo Contrast  Result Date: 05/12/2019 CLINICAL DATA:  Lumbar radiculopathy, no red flags. Additional history provided: Numbness of the feet and legs which began October 2020. EXAM: MRI LUMBAR SPINE WITHOUT AND WITH CONTRAST TECHNIQUE: Multiplanar and multiecho pulse sequences of the lumbar spine were obtained without and with intravenous contrast. CONTRAST:  102m GADAVIST GADOBUTROL 1 MMOL/ML IV SOLN COMPARISON:  No pertinent prior studies available for comparison. FINDINGS: Segmentation: For the purposes of this dictation, five lumbar vertebrae are assumed and the caudal most well-formed intervertebral  disc is designated L5-S1. Alignment: Straightening of the expected lumbar lordosis. No significant spondylolisthesis. Vertebrae: Vertebral body height is maintained. There is a 13 mm lesion within the posteroinferior aspect of the S1 sacral segment which demonstrates T1 hypointensity, T2/STIR hyperintensity and avid enhancement (series 3, image 7) (series 7, image 7). Additional 15 mm enhancing lesion within the right sacral ala (series 6, image 35) (series 8, image 35). Mild L5-S1 degenerative endplate irregularity with mixed degenerative endplate marrow signal and a small Schmorl node within the S1 superior endplate. Conus medullaris and cauda equina: Conus extends to the L1-L2 level. No signal abnormality within the visualized distal spinal cord. No abnormal enhancement of the visualized distal spinal cord or cauda equina nerve roots. Paraspinal and other soft tissues: No abnormality identified within included portions of the abdomen/retroperitoneum. Paraspinal soft tissues within normal limits. Disc levels: Moderate L5-S1 disc degeneration. Mild-to-moderate disc degeneration is also present at L3-L4 and L4-L5. T12-L1: This level is not included on axial imaging. No disc herniation. No significant canal or foraminal stenosis. L1-L2: No disc herniation. No significant canal or foraminal stenosis. Small posteriorly projecting synovial facet cyst on the left. L2-L3: No disc herniation. No significant canal or foraminal stenosis. L3-L4: Disc bulge with endplate spurring. Mild facet arthrosis/ligamentum flavum hypertrophy. Mild relative spinal canal narrowing without nerve root impingement. Mild bilateral neural foraminal narrowing. L4-L5: Disc bulge with endplate spurring. Mild facet arthrosis/ligamentum flavum hypertrophy. Mild relative spinal canal narrowing without nerve root impingement. Mild bilateral neural foraminal narrowing. L5-S1: Disc bulge with endplate spurring. Mild facet arthrosis. No significant  spinal canal stenosis. Mild left neural foraminal narrowing. IMPRESSION: Two enhancing osseous lesions, one measuring 13 mm within the posterior inferior S1 sacral segment, and the other  measuring 15 mm within the right sacral ala. Findings are highly suspicious for osseous metastatic disease. Given the provided history of numbness from the waist down, consider contrast-enhanced MRI of the thoracic spine to assess for metastatic disease and cord compression at these levels. Lumbar spondylosis as detailed and greatest at L5-S1. No more than mild spinal canal or neural foraminal narrowing within the lumbar spine. Electronically Signed   By: Kellie Simmering DO   On: 05/12/2019 13:27   CT ABDOMEN PELVIS W CONTRAST  Result Date: 05/13/2019 CLINICAL DATA:  64 year old male with history of cancer of unknown primary. EXAM: CT CHEST, ABDOMEN, AND PELVIS WITH CONTRAST TECHNIQUE: Multidetector CT imaging of the chest, abdomen and pelvis was performed following the standard protocol during bolus administration of intravenous contrast. CONTRAST:  134m OMNIPAQUE IOHEXOL 300 MG/ML  SOLN COMPARISON:  No priors. FINDINGS: CT CHEST FINDINGS Cardiovascular: Heart size is normal. There is no significant pericardial fluid, thickening or pericardial calcification. No atherosclerotic calcifications in the thoracic aorta or the coronary arteries. Filling defects are present within segmental and subsegmental sized pulmonary artery branches in the lungs bilaterally, indicative of pulmonary embolism. The largest of these is in the right lower lobe (axial image 39 of series 3). At this time, these appear to be nonocclusive. Mediastinum/Nodes: Large heterogeneously enhancing thyroid mass incompletely imaged at the level of the thoracic inlet measuring at least 11.8 x 9.8 cm (axial image 1 of series 3). No pathologically enlarged mediastinal or hilar lymph nodes. Esophagus is unremarkable in appearance. No axillary lymphadenopathy.  Lungs/Pleura: Multiple small pulmonary nodules are scattered throughout the lungs bilaterally, highly concerning for widespread metastatic disease, largest of which measure up to 9 mm in the left lower lobe (axial image 122 of series 5, and 11 mm in the right lower lobe (axial image 121 of series 5). Small right pleural effusion which is partially loculated laterally, but predominantly layers dependently. No left pleural effusion. No acute consolidative airspace disease. Musculoskeletal: Lytic lesions are noted in the thoracic spine involving both T7 and T10. The largest lesion involves the posterior aspect of the T10 vertebral body, extending through the pedicle into the lateral and posterior elements, as well as involving the adjacent head and neck of the left tenth rib, measuring 5.1 x 3.8 cm (axial image 102 of series 5), encroaching upon and significantly narrowing the central spinal canal. The smaller lesion is at T7 measuring approximately 3.9 x 2.7 cm also involving the left side of the vertebral body extending into the pedicle and left transverse process, as well as the left paraspinal soft tissues with minimal encroachment upon the central spinal canal. Both of these lesions demonstrate extensive enhancement. CT ABDOMEN PELVIS FINDINGS Hepatobiliary: No suspicious cystic or solid hepatic lesions. No intra or extrahepatic biliary ductal dilatation. Gallbladder is normal in appearance. Pancreas: No pancreatic mass. No pancreatic ductal dilatation. No pancreatic or peripancreatic fluid collections or inflammatory changes. Spleen: Unremarkable. Adrenals/Urinary Tract: Subcentimeter low-attenuation lesion in the interpolar region of the right kidney, too small to characterize, but statistically likely to represent a tiny cyst. Left kidney and bilateral adrenal glands are normal in appearance. No hydroureteronephrosis. Urinary bladder is normal in appearance. Stomach/Bowel: Normal appearance of the stomach. No  pathologic dilatation of small bowel or colon. Normal appendix. Vascular/Lymphatic: Atherosclerotic calcifications in the pelvic vasculature. No aneurysm or dissection noted in the abdominal or pelvic vasculature. No lymphadenopathy noted in the abdomen or pelvis. Reproductive: Prostate gland and seminal vesicles are unremarkable. Other: No significant volume  of ascites.  No pneumoperitoneum. Musculoskeletal: There are no aggressive appearing lytic or blastic lesions noted in the visualized portions of the skeleton. IMPRESSION: 1. Widespread metastatic disease to the lungs, small right pleural effusion which is partially loculated and may be malignant, and metastatic lesions in the thoracic spine, as detailed above. A primary source is uncertain, however, the lesions of the thoracic spine demonstrate avid enhancement, which is very similar to the enhancement characteristics of the large thyroid mass. 2. Multiple segmental and subsegmental sized filling defects in the pulmonary arteries bilaterally, compatible with pulmonary embolism. 3. Large heterogeneously enhancing thyroid mass incompletely imaged. Although this may simply represent a thyroid goiter, the possibility of primary thyroid neoplasm should be considered, particularly in light of the similar enhancement characteristics of the spinal lesions (discussed above). 4. Additional incidental findings, as above. Electronically Signed   By: Vinnie Langton M.D.   On: 05/13/2019 08:58       IMPRESSION/PLAN: 1. 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  Dr. Lisbeth Renshaw has reviewed the findings from the patient's imaging, there appear to be 3 areas in total affected by this process which we suspect to be malignant.  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  We will follow-up with the results of pathology  when available.  Unfortunately due to the multifocal sites of his disease he would not be a good candidate for stereotactic radiotherapy however he would be a good candidate for palliative radiotherapy.  With the location of his disease, it would be very difficult to plan to separate areas of treatment in the thoracic spine to Dr. Lisbeth Renshaw would offer a palliative course to levels T6-T11 as well as to the sacrum covering S1 and the sacral ala.  We would anticipate proceeding in this manner following a few weeks of postoperative recovery to ensure that he is fully healed at the skin.  If he were to develop progressive neurologic changes however this could be reconsidered as far as timing.  He and I spoke about the options of radiotherapy, the logistics logistics, delivery and course of treatment.  We would anticipate a 3-week course of treatment due to the plan for ongoing systemic therapy with Dr. Alen Blew once his primary is determined.  We discussed the risks, benefits, short and long-term effects of therapy and the patient is interested in proceeding at the appropriate interval.  We will coordinate simulation and anticipate this to occur the week of 05/26/2019.  In a visit lasting 75 minutes, greater than 50% of the time was spent by phone and discussing the patient's condition, in preparation for the discussion, and coordinating the patient's care.    Carola Rhine, PAC

## 2019-05-14 NOTE — Evaluation (Signed)
Physical Therapy Evaluation Patient Details Name: Tyrone Washington MRN: [id number] DOB: 05/14/1955 Today's Date: 05/14/2019   History of Present Illness  Patient is a 64 y/o male admitted with falling, numbness in LE's.  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  Now s/p T9-10 laminectomy for epidural tumor and Left T9 transpedicular resection of epidural tumor, microdissection  Clinical Impression  Patient presents with decreased independence with mobility due to decreased LE strength, sensation and coordination with multiple falls prior to admission.  He was, however continuing to live independently, though had started using a cane.  Feel he would benefit from short CIR stay prior to d/c home to ensure independence and less fall risk prior to d/c as he has limited community support.     Follow Up Recommendations CIR    Equipment Recommendations  Rolling walker with 5" wheels;3in1 (PT)    Recommendations for Other Services       Precautions / Restrictions Precautions Precautions: Fall Precaution Comments: multiple falls at home Restrictions Weight Bearing Restrictions: No      Mobility  Bed Mobility Overal bed mobility: Needs Assistance Bed Mobility: Rolling;Sidelying to Sit Rolling: Min assist Sidelying to sit: Min assist;HOB elevated       General bed mobility comments: educated in spinal precautions and assist for technique, cues for using rail  Transfers Overall transfer level: Needs assistance Equipment used: Rolling walker (2 wheeled) Transfers: Sit to/from Stand Sit to Stand: Min assist         General transfer comment: for balance/cues for hand placement  Ambulation/Gait Ambulation/Gait assistance: Min assist Gait Distance (Feet): 100 Feet(x 2) Assistive device: Rolling walker (2 wheeled) Gait Pattern/deviations: Ataxic;Decreased dorsiflexion - right;Decreased stride length;Scissoring;Step-through  pattern     General Gait Details: heavy reliance on UE support, min A for balance/safety with episodes of scissoring, cues for posture  Stairs            Wheelchair Mobility    Modified Rankin (Stroke Patients Only)       Balance Overall balance assessment: Needs assistance;History of Falls Sitting-balance support: Feet supported Sitting balance-Leahy Scale: Fair     Standing balance support: Bilateral upper extremity supported Standing balance-Leahy Scale: Poor Standing balance comment: UE support for standing balance, min A for dynamic balance                             Pertinent Vitals/Pain Pain Assessment: 0-10 Pain Score: 3  Pain Location: feet Pain Descriptors / Indicators: Tingling;Numbness Pain Intervention(s): Monitored during session;Repositioned    Home Living Family/patient expects to be discharged to:: Private residence Living Arrangements: Alone   Type of Home: House Home Access: Stairs to enter Entrance Stairs-Rails: Right Entrance Stairs-Number of Steps: "couple" Home Layout: One level Home Equipment: Cane - single point      Prior Function Level of Independence: Independent;Independent with assistive device(s)         Comments: walks with cane, multiple recent falls     Hand Dominance        Extremity/Trunk Assessment   Upper Extremity Assessment Upper Extremity Assessment: Overall WFL for tasks assessed    Lower Extremity Assessment Lower Extremity Assessment: LLE deficits/detail;RLE deficits/detail RLE Deficits / Details: AROM WFL, strength hip flexion 3/5, knee extension 4/5, ankle DF 4/5 RLE Sensation: decreased light touch(numbness/tingling all the way up to clavicular area, worse distal) RLE Coordination: decreased fine motor;decreased gross motor LLE Deficits /  Details: AAROM WFL, strength hip flexion 3-/5, knee extension 4-/5, ankle DF 3+/5 LLE Sensation: decreased light touch(numbness/tingling all the way  up to clavicular area, worse distal) LLE Coordination: decreased gross motor;decreased fine motor       Communication   Communication: No difficulties  Cognition Arousal/Alertness: Awake/alert Behavior During Therapy: WFL for tasks assessed/performed Overall Cognitive Status: Within Functional Limits for tasks assessed                                        General Comments General comments (skin integrity, edema, etc.): 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    Exercises     Assessment/Plan    PT Assessment Patient needs continued PT services  PT Problem List Decreased strength;Decreased mobility;Decreased safety awareness;Impaired sensation;Decreased knowledge of use of DME;Decreased coordination;Decreased balance       PT Treatment Interventions DME instruction;Stair training;Therapeutic activities;Balance training;Patient/family education;Therapeutic exercise;Gait training;Functional mobility training    PT Goals (Current goals can be found in the Care Plan section)  Acute Rehab PT Goals Patient Stated Goal: to be independent PT Goal Formulation: With patient Time For Goal Achievement: 05/28/19 Potential to Achieve Goals: Good    Frequency Min 5X/week   Barriers to discharge        Co-evaluation               AM-PAC PT "6 Clicks" Mobility  Outcome Measure Help needed turning from your back to your side while in a flat bed without using bedrails?: A Little Help needed moving from lying on your back to sitting on the side of a flat bed without using bedrails?: A Little Help needed moving to and from a bed to a chair (including a wheelchair)?: A Little Help needed standing up from a chair using your arms (e.g., wheelchair or bedside chair)?: A Little Help needed to walk in hospital room?: A Little Help needed climbing 3-5 steps with a railing? : A Lot 6 Click Score: 17    End of Session   Activity Tolerance: Patient tolerated treatment  well Patient left: Other (comment)(in bathroom with handoff to OT) Nurse Communication: Mobility status PT Visit Diagnosis: Other abnormalities of gait and mobility (R26.89);Repeated falls (R29.6);Muscle weakness (generalized) (M62.81)    Time: [id number] PT Time Calculation (min) (ACUTE ONLY): 34 min   Charges:   PT Evaluation $PT Eval Moderate Complexity: 1 Mod PT Treatments $Gait Training: 8-22 mins        Magda Kiel, [place] [facility] [phone] 05/14/2019   [name] 05/14/2019, 10:19 AM

## 2019-05-14 NOTE — Consult Note (Signed)
Reason for the request:    Metastatic malignancy  HPI: I was asked by Dr. Annette Stable to evaluate Tyrone Washington for the evaluation of advanced malignancy.  He is a 64 year old man presented with a long history of pain and weakness in his knees and feet.  He subsequently was referred to the emergency department urgently on March 1 due to progressive motor and sensory deficits in the lower extremities.  His evaluation at that time included an MRI of the spine which showed a epidural extension of tumor and spinal cord compression.  He underwent urgent evaluation by Dr. Annette Stable and underwent T9 and T10 laminectomy and resection of an epidural tumor.  Staging CT scan obtained on March 2 which included CT scan chest abdomen and pelvis showed multiple small pulmonary nodules scattered in the lung bilaterally with the largest of which measuring 9 mm in the left lower lobe and 11 mm in the right lower lobe.  Small pleural effusion was noted.  He was noted to have lytic lesions in thoracic spine including T7 and T10.  Multiple segmental and subsegmental filling defect in the pulmonary arteries compatible with pulmonary embolism.  Clinically, he is recovering reasonably well from his surgery sitting in the chair.  His lower extremity weaknesses improved but still has pain in his knees and feet.  He denied any back pain or discomfort at this time.  He does not report any headaches, blurry vision, syncope or seizures. Does not report any fevers, chills or sweats.  Does not report any cough, wheezing or hemoptysis.  Does not report any chest pain, palpitation, orthopnea or leg edema.  Does not report any nausea, vomiting or abdominal pain.  Does not report any constipation or diarrhea.  Does not report any skeletal complaints.    Does not report frequency, urgency or hematuria.  Does not report any skin rashes or lesions. Does not report any heat or cold intolerance.  Does not report any lymphadenopathy or petechiae.  Does not report  any anxiety or depression.  Remaining review of systems is negative.    Past Medical History:  Diagnosis Date  . Allergy    allergic rhinitis  . Arthritis   . B12 deficiency 04/10/2019  . History of chicken pox   . Hyperlipidemia    no meds now  :  Past Surgical History:  Procedure Laterality Date  . COLONOSCOPY    . THYROID SURGERY     "Years ago"  :   Current Facility-Administered Medications:  .  0.9 %  sodium chloride infusion, 250 mL, Intravenous, PRN, Earnie Larsson, MD .  0.9 %  sodium chloride infusion, 250 mL, Intravenous, Continuous, Pool, Henry, MD .  0.9 %  sodium chloride infusion, , Intravenous, Continuous, Ashok Pall, MD, Last Rate: 100 mL/hr at 05/14/19 0700, Rate Verify at 05/14/19 0700 .  acetaminophen (TYLENOL) tablet 650 mg, 650 mg, Oral, Q6H PRN **OR** acetaminophen (TYLENOL) suppository 650 mg, 650 mg, Rectal, Q6H PRN, Pool, Mallie Mussel, MD .  bisacodyl (DULCOLAX) suppository 10 mg, 10 mg, Rectal, Daily PRN, Earnie Larsson, MD .  Chlorhexidine Gluconate Cloth 2 % PADS 6 each, 6 each, Topical, Daily, Earnie Larsson, MD, 6 each at 05/14/19 0848 .  dexamethasone (DECADRON) injection 10 mg, 10 mg, Intravenous, Q6H, Pool, Mallie Mussel, MD, 10 mg at 05/14/19 1234 .  diazepam (VALIUM) tablet 5-10 mg, 5-10 mg, Oral, Q6H PRN, Earnie Larsson, MD .  HYDROcodone-acetaminophen (NORCO/VICODIN) 5-325 MG per tablet 1-2 tablet, 1-2 tablet, Oral, Q4H PRN, Earnie Larsson, MD .  HYDROmorphone (DILAUDID) injection 1 mg, 1 mg, Intravenous, Q2H PRN, Pool, Mallie Mussel, MD .  iohexol (OMNIPAQUE) 350 MG/ML injection 75 mL, 75 mL, Intravenous, Once PRN, Earnie Larsson, MD .  menthol-cetylpyridinium (CEPACOL) lozenge 3 mg, 1 lozenge, Oral, PRN **OR** phenol (CHLORASEPTIC) mouth spray 1 spray, 1 spray, Mouth/Throat, PRN, Pool, Mallie Mussel, MD .  ondansetron (ZOFRAN) tablet 4 mg, 4 mg, Oral, Q6H PRN **OR** ondansetron (ZOFRAN) injection 4 mg, 4 mg, Intravenous, Q6H PRN, Earnie Larsson, MD .  oxyCODONE (Oxy IR/ROXICODONE) immediate  release tablet 10 mg, 10 mg, Oral, Q3H PRN, Earnie Larsson, MD .  phenylephrine (NEOSYNEPHRINE) 10-0.9 MG/250ML-% infusion, 0-400 mcg/min, Intravenous, Titrated, Hodierne, Adam, MD, Last Rate: 30 mL/hr at 05/14/19 1410, 20 mcg/min at 05/14/19 1410 .  polyethylene glycol (MIRALAX / GLYCOLAX) packet 17 g, 17 g, Oral, Daily PRN, Pool, Mallie Mussel, MD .  sodium chloride flush (NS) 0.9 % injection 3 mL, 3 mL, Intravenous, Q12H, Earnie Larsson, MD, 3 mL at 05/14/19 [id number] .  sodium chloride flush (NS) 0.9 % injection 3 mL, 3 mL, Intravenous, PRN, Earnie Larsson, MD .  sodium chloride flush (NS) 0.9 % injection 3 mL, 3 mL, Intravenous, Q12H, Earnie Larsson, MD, 3 mL at 05/14/19 [id number] .  sodium chloride flush (NS) 0.9 % injection 3 mL, 3 mL, Intravenous, PRN, Pool, Mallie Mussel, MD .  sodium phosphate (FLEET) 7-19 GM/118ML enema 1 enema, 1 enema, Rectal, Once PRN, Earnie Larsson, MD:  No Known Allergies:  Family History  Problem Relation Age of Onset  . Heart disease Father        patient is unsure of history  . CVA Father   . Heart attack Neg Hx   . Colon cancer Neg Hx   . Esophageal cancer Neg Hx   . Rectal cancer Neg Hx   . Stomach cancer Neg Hx   :  Social History   Socioeconomic History  . Marital status: Single    Spouse name: Not on file  . Number of children: 2  . Years of education: Not on file  . Highest education level: Not on file  Occupational History    Employer: [employer]  Tobacco Use  . Smoking status: Current Every Day Smoker  . Smokeless tobacco: Never Used  . Tobacco comment: Smokes 1 - 2 cigars daily  Substance and Sexual Activity  . Alcohol use: Yes    Alcohol/week: 7.0 standard drinks    Types: 7 Cans of beer per week  . Drug use: Never  . Sexual activity: Not on file  Other Topics Concern  . Not on file  Social History Narrative   Works part time at [employer] 12 hours a week.  Shipping/receiveing cargo with [employer]   Looking for full time work   Lives alone, single   2 grown children  (Daughter- Engineer, mining)   Son- ([employer])   1 grandchild in [place] (son's child)   Enjoys sleeping.     Denies hobbies   No pets.    Social Determinants of Health   Financial Resource Strain:   . Difficulty of Paying Living Expenses: Not on file  Food Insecurity:   . Worried About Charity fundraiser in the Last Year: Not on file  . Ran Out of Food in the Last Year: Not on file  Transportation Needs:   . Lack of Transportation (Medical): Not on file  . Lack of Transportation (Non-Medical): Not on file  Physical Activity:   . Days of Exercise per Week: Not on file  .  Minutes of Exercise per Session: Not on file  Stress:   . Feeling of Stress : Not on file  Social Connections:   . Frequency of Communication with Friends and Family: Not on file  . Frequency of Social Gatherings with Friends and Family: Not on file  . Attends Religious Services: Not on file  . Active Member of Clubs or Organizations: Not on file  . Attends Archivist Meetings: Not on file  . Marital Status: Not on file  Intimate Partner Violence:   . Fear of Current or Ex-Partner: Not on file  . Emotionally Abused: Not on file  . Physically Abused: Not on file  . Sexually Abused: Not on file  :  Pertinent items are noted in HPI.  Exam: Blood pressure 113/72, pulse 77, temperature 99 F (37.2 C), temperature source Oral, resp. rate 18, height 6\' 1"  (1.854 m), weight 158 lb 15.2 oz (72.1 kg), SpO2 99 %. General appearance: alert and cooperative appeared without distress. Head: atraumatic without any abnormalities. Large neck mass noted on exam. Eyes: conjunctivae/corneas clear. PERRL.  Sclera anicteric. Throat: lips, mucosa, and tongue normal; without oral thrush or ulcers. Resp: clear to auscultation bilaterally without rhonchi, wheezes or dullness to percussion. Cardio: regular rate and rhythm, S1, S2 normal, no murmur, click, rub or gallop GI: soft, non-tender; bowel sounds normal; no masses,  no  organomegaly Skin: Skin color, texture, turgor normal. No rashes or lesions Lymph nodes: Cervical, supraclavicular, and axillary nodes normal. Neurologic: Grossly normal without any motor, sensory or deep tendon reflexes. Musculoskeletal: No joint deformity or effusion.  Recent Labs    05/12/19 2359 05/13/19 1528 05/13/19 1618 05/13/19 1618 05/13/19 1845 05/13/19 1845 05/14/19 0449 05/14/19 1130  WBC 4.6  --   --   --  16.6*  --   --   --   HGB 10.7*   < > 9.8*   < > 9.6*   < > 8.0* 8.0*  HCT 33.8*   < > 30.3*   < > 29.2*   < > 24.2* 24.4*  PLT 326  --  306  --  300  --   --   --    < > = values in this interval not displayed.   Recent Labs    05/12/19 0932 05/12/19 0932 05/13/19 1528 05/14/19 0449  NA 139   < > 140 137  K 4.3   < > 4.0 4.3  CL 105   < > 103 108  CO2 25  --   --  21*  GLUCOSE 102*   < > 186* 158*  BUN 12   < > 15 13  CREATININE 1.07   < > 0.80 0.93  CALCIUM 9.2  --   --  7.9*   < > = values in this interval not displayed.     CT CHEST W CONTRAST  Result Date: 05/13/2019 CLINICAL DATA:  64 year old male with history of cancer of unknown primary. EXAM: CT CHEST, ABDOMEN, AND PELVIS WITH CONTRAST TECHNIQUE: Multidetector CT imaging of the chest, abdomen and pelvis was performed following the standard protocol during bolus administration of intravenous contrast. CONTRAST:  140mL OMNIPAQUE IOHEXOL 300 MG/ML  SOLN COMPARISON:  No priors. FINDINGS: CT CHEST FINDINGS Cardiovascular: Heart size is normal. There is no significant pericardial fluid, thickening or pericardial calcification. No atherosclerotic calcifications in the thoracic aorta or the coronary arteries. Filling defects are present within segmental and subsegmental sized pulmonary artery branches in the lungs bilaterally, indicative  of pulmonary embolism. The largest of these is in the right lower lobe (axial image 39 of series 3). At this time, these appear to be nonocclusive. Mediastinum/Nodes: Large  heterogeneously enhancing thyroid mass incompletely imaged at the level of the thoracic inlet measuring at least 11.8 x 9.8 cm (axial image 1 of series 3). No pathologically enlarged mediastinal or hilar lymph nodes. Esophagus is unremarkable in appearance. No axillary lymphadenopathy. Lungs/Pleura: Multiple small pulmonary nodules are scattered throughout the lungs bilaterally, highly concerning for widespread metastatic disease, largest of which measure up to 9 mm in the left lower lobe (axial image 122 of series 5, and 11 mm in the right lower lobe (axial image 121 of series 5). Small right pleural effusion which is partially loculated laterally, but predominantly layers dependently. No left pleural effusion. No acute consolidative airspace disease. Musculoskeletal: Lytic lesions are noted in the thoracic spine involving both T7 and T10. The largest lesion involves the posterior aspect of the T10 vertebral body, extending through the pedicle into the lateral and posterior elements, as well as involving the adjacent head and neck of the left tenth rib, measuring 5.1 x 3.8 cm (axial image 102 of series 5), encroaching upon and significantly narrowing the central spinal canal. The smaller lesion is at T7 measuring approximately 3.9 x 2.7 cm also involving the left side of the vertebral body extending into the pedicle and left transverse process, as well as the left paraspinal soft tissues with minimal encroachment upon the central spinal canal. Both of these lesions demonstrate extensive enhancement. CT ABDOMEN PELVIS FINDINGS Hepatobiliary: No suspicious cystic or solid hepatic lesions. No intra or extrahepatic biliary ductal dilatation. Gallbladder is normal in appearance. Pancreas: No pancreatic mass. No pancreatic ductal dilatation. No pancreatic or peripancreatic fluid collections or inflammatory changes. Spleen: Unremarkable. Adrenals/Urinary Tract: Subcentimeter low-attenuation lesion in the interpolar region  of the right kidney, too small to characterize, but statistically likely to represent a tiny cyst. Left kidney and bilateral adrenal glands are normal in appearance. No hydroureteronephrosis. Urinary bladder is normal in appearance. Stomach/Bowel: Normal appearance of the stomach. No pathologic dilatation of small bowel or colon. Normal appendix. Vascular/Lymphatic: Atherosclerotic calcifications in the pelvic vasculature. No aneurysm or dissection noted in the abdominal or pelvic vasculature. No lymphadenopathy noted in the abdomen or pelvis. Reproductive: Prostate gland and seminal vesicles are unremarkable. Other: No significant volume of ascites.  No pneumoperitoneum. Musculoskeletal: There are no aggressive appearing lytic or blastic lesions noted in the visualized portions of the skeleton. IMPRESSION: 1. Widespread metastatic disease to the lungs, small right pleural effusion which is partially loculated and may be malignant, and metastatic lesions in the thoracic spine, as detailed above. A primary source is uncertain, however, the lesions of the thoracic spine demonstrate avid enhancement, which is very similar to the enhancement characteristics of the large thyroid mass. 2. Multiple segmental and subsegmental sized filling defects in the pulmonary arteries bilaterally, compatible with pulmonary embolism. 3. Large heterogeneously enhancing thyroid mass incompletely imaged. Although this may simply represent a thyroid goiter, the possibility of primary thyroid neoplasm should be considered, particularly in light of the similar enhancement characteristics of the spinal lesions (discussed above). 4. Additional incidental findings, as above. Electronically Signed   By: Vinnie Langton M.D.   On: 05/13/2019 08:58   MR Cervical Spine W or Wo Contrast  Result Date: 05/12/2019 CLINICAL DATA:  Bilateral lower extremity weakness, abnormal lumbar spine EXAM: MRI CERVICAL SPINE WITHOUT AND WITH CONTRAST TECHNIQUE:  Multiplanar and multiecho  pulse sequences of the cervical spine, to include the craniocervical junction and cervicothoracic junction, were obtained without and with intravenous contrast. CONTRAST:  75mL GADAVIST GADOBUTROL 1 MMOL/ML IV SOLN COMPARISON:  None. FINDINGS: Alignment: Anteroposterior alignment is maintained. Vertebrae: Vertebral body heights are preserved. There is no substantial marrow edema or suspicious osseous lesion. Cord: No abnormal signal. Posterior Fossa, vertebral arteries, paraspinal tissues: Markedly enlarged and heterogeneous thyroid is incompletely evaluated. Disc levels: Minor disc bulges are present. Mild facet and uncovertebral hypertrophy. There is no high-grade degenerative stenosis. IMPRESSION: No evidence cervical spine metastatic disease. Electronically Signed   By: Macy Mis M.D.   On: 05/12/2019 21:50   MR THORACIC SPINE W WO CONTRAST  Result Date: 05/12/2019 CLINICAL DATA:  Lower extremity weakness and numbness, abnormal lumbar spine EXAM: MRI THORACIC WITHOUT AND WITH CONTRAST TECHNIQUE: Multiplanar and multiecho pulse sequences of the thoracic spine were obtained without and with intravenous contrast. CONTRAST:  98mL GADAVIST GADOBUTROL 1 MMOL/ML IV SOLN COMPARISON:  None. FINDINGS: Motion artifact is present. Alignment: Thoracic kyphosis is preserved. No substantial anteroposterior listhesis. Vertebrae: There is abnormal STIR hyperintensity and enhancement at the T7 level extending into the posterior elements on the left. Additional involvement of the inferior left aspect of the T6 vertebral body. There is extraosseous extension at T7 including the ventral and left lateral spinal canal. Effacement of the left lateral subarachnoid space without cord compression. There is also effacement of the left T7-T8 foramen and partial effacement of the left T6-T7 foramen. Abnormal STIR hyperintensity and enhancement at the T10 level extending into the left much greater than right  posterior elements with involvement of the left tenth rib. Additional involvement of the left aspect of the T9 vertebral body and posterior elements. Significant extraosseous extension at inferior T9 and T10 including left ventral, lateral, and dorsal epidural extension with cord displacement and compression. There is likely abnormal cord signal. Effacement of the left T9-T10 and T10-T11 foramina. Cord:  As above.  Otherwise unremarkable. Paraspinal and other soft tissues: Partially imaged significantly enlarged and heterogeneous thyroid is incompletely evaluated. Disc levels: Mild degenerative disc disease and facet arthropathy. There is no high-grade degenerative stenosis. IMPRESSION: Abnormal signal at T7 and T10 with additional less pronounced adjacent involvement at T6 and T9 consistent with metastatic disease. Extraosseous extension is present at both levels as detailed above. Most notably, there is severe canal stenosis with cord compression and abnormal cord signal at the inferior T9 and T10 level. These results were called by telephone at the time of interpretation on 05/12/2019 at 9:43 pm to provider Madilyn Hook, who verbally acknowledged these results. Electronically Signed   By: Macy Mis M.D.   On: 05/12/2019 21:44    Assessment and Plan:     64 year old with:  1.  Advanced malignancy presented with epidural involvement at T9 and T10 as well as T7.  He has also documented pulmonary metastasis.  This is in the setting of a large thyroid mass that has been biopsied in the past which showed follicular lesion without any malignancy in 2012.  Differential diagnosis was reviewed at this time which include number of malignancies including thyroid cancer among others. Management options and treatment choices will be determined pending the results of his pathology from his laminectomy which is not available at this time.  It is clear that he will require systemic therapy of some form or fashion  pending the final pathology at that time.  2.  Large thyroid mass: Unclear etiology at this  point.  Fine-needle aspiration from 2012 showed benign findings.  Follow-up with endocrinology may be needed.  3.  Pulmonary emboli: Related to malignancy.  I recommend anticoagulation and Xarelto or Eliquis would be reasonable for the time being.  4.  Disposition and follow-up: We will arrange follow-up at the cancer center upon his discharge to follow-up on the results of his pathology and further discuss treatment choices.  80  minutes were dedicated to this visit.  50% of the time was spent face to face  reviewing imaging studies, discussing treatment options, discussing differential diagnosis and answering questions regarding future plan.

## 2019-05-14 NOTE — Progress Notes (Signed)
Rehab Admissions Coordinator Note:  Per PT recommendation, this patient was screened by Raechel Ache for appropriateness for an Inpatient Acute Rehab Consult.  At this time, we are recommending Inpatient Rehab consult. AC will contact MD to request order.   Raechel Ache 05/14/2019, 11:53 AM  I can be reached at [phone].

## 2019-05-15 LAB — CBC
HCT: 21.6 % — ABNORMAL LOW (ref 39.0–52.0)
Hemoglobin: 7.3 g/dL — ABNORMAL LOW (ref 13.0–17.0)
MCH: 30.4 pg (ref 26.0–34.0)
MCHC: 33.8 g/dL (ref 30.0–36.0)
MCV: 90 fL (ref 80.0–100.0)
Platelets: 218 10*3/uL (ref 150–400)
RBC: 2.4 MIL/uL — ABNORMAL LOW (ref 4.22–5.81)
RDW: 14.3 % (ref 11.5–15.5)
WBC: 13.7 10*3/uL — ABNORMAL HIGH (ref 4.0–10.5)
nRBC: 0 % (ref 0.0–0.2)

## 2019-05-15 LAB — BASIC METABOLIC PANEL
Anion gap: 8 (ref 5–15)
BUN: 14 mg/dL (ref 8–23)
CO2: 22 mmol/L (ref 22–32)
Calcium: 8.4 mg/dL — ABNORMAL LOW (ref 8.9–10.3)
Chloride: 109 mmol/L (ref 98–111)
Creatinine, Ser: 1.01 mg/dL (ref 0.61–1.24)
GFR calc Af Amer: >60 mL/min (ref >60)
GFR calc non Af Amer: >60 mL/min (ref >60)
Glucose, Bld: 141 mg/dL — ABNORMAL HIGH (ref 70–99)
Potassium: 3.8 mmol/L (ref 3.5–5.1)
Sodium: 139 mmol/L (ref 135–145)

## 2019-05-15 NOTE — PMR Pre-admission (Signed)
PMR Admission Coordinator Pre-Admission Assessment  Patient: Tyrone Washington is an 64 y.o., male MRN: [id number] DOB: 15-Oct-1955 Height: '6\' 1"'  (185.4 cm) Weight: 72.1 kg  Insurance Information HMO:     PPO: yes     PCP:      IPA:      80/20:      OTHER:  PRIMARY: Rickey Primus      Policy#: [id number]      Subscriber: Patient CM Name: York Grice      Phone#: [phone]     Fax#: [phone] Pre-Cert#: [id number]      Employer: [employer] provided by York Grice for admite to CIR with start date 3/5. Pt is approved for 14 days with last covered date 3/18. Clinicals are to be faxed to (f): [phone] Benefits:  Phone #: [phone] and [email or web address] provider portal    Eff. Date: 03/14/19-03/12/20     Deduct: $600 ($600 met)      Out of Pocket Max: $3,500 (includes deductible - $1,169.52 met)      Life Max: NA CIR: 80% coverage, 20% co-insurance      SNF: 100% coverage, 0% co-insurnace; limited to 180 days per cal yr; 365 day lifetime limit Outpatient: 80% coverage, 20% co-insurance; limited to 30 visits per cal yr per PT/OT/ST     Home Health: 100% coverage, 0% co-insurance; limited by medical necessity DME: 80% coverage, 20% co-insurance Providers:  SECONDARY: Medicare A and B       Policy#: [id number]      Subscriber: Patient CM Name:       Phone#:      Fax#:  Pre-Cert#:       Employer:  Benefits:  Phone #:      Name:  Eff. Date: verified eligibility via [employer] on 05/16/19     Deduct:       Out of Pocket Max:       Life Max:  CIR:       SNF:  Outpatient:      Co-Pay:  Home Health:       Co-Pay:  DME:      Co-Pay:   Medicaid Application Date:       Case Manager:  Disability Application Date:       Case Worker:   The "Data Collection Information Summary" for patients in Inpatient Rehabilitation Facilities with attached "Privacy Act Honor Records" was provided and verbally reviewed with: Patient  Emergency Contact Information Contact Information    Name Relation Home Work  [place] Daughter   [phone]   Tyrone Washington   [phone]      Current Medical History  Patient Admitting Diagnosis: LE weakness after spinal tumor resection  History of Present Illness:  Tyrone Washington is a 64 year old male with history of hyperlipidemia and tobacco abuse as well as 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  Per chart review patient lives alone 1 level home with a few steps to entry.  Independent with assistive device however patient had reported multiple falls.  Patient presented 05/13/2019 with 19-monthhistory of progressive lower extremity weakness and sensory loss.  He denied any bowel or bladder disturbances.  Admission chemistries with hemoglobin of 12, chemistries unremarkable, urinalysis negative nitrite, SARS coronavirus negative.  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  At the T10 level patient had evidence of marked epidural extension of neoplastic process in the  left side with critical spinal cord compression.  Vertebral body and right sided articular masses however were spared.  He does have some metastatic disease thoracic spine T7 level that was noncompressive as well as in the sacrum.  Staging CT scan obtained March 2 included CT chest abdomen pelvis showing multiple small pulmonary nodules scattered in the lungs bilaterally with the largest of which measuring 9 mm in the left lower lobe and 11 mm in the right lower lobe.  He was noted to have lytic lesions in the thoracic spine including T7 and T10.  Multiple segmental and subsegmental filling defect in the pulmonary arteries compatible with pulmonary embolism.  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 Annette Stable.  Plan is to begin Xarelto for pulmonary emboli postop day 10  beginning 05/23/2019.  Oncology services consulted Dr. Alen Blew 05/14/2019 and radiation Oncology for advanced malignancies with chart reviewed recommendations were for follow-up cancer center on discharge with pathology report pending.  Patient currently remains on Decadron protocol.  Acute blood loss anemia 8.0. Therapies have evaluated and are recommending CIR. Pt is to admit to CIR on 05/17/19.   Complete NIHSS TOTAL: 0  Patient's medical record from [facility] has been reviewed by the rehabilitation admission coordinator and physician.  Past Medical History  Past Medical History:  Diagnosis Date  . Allergy    allergic rhinitis  . Arthritis   . B12 deficiency 04/10/2019  . History of chicken pox   . Hyperlipidemia    no meds now    Family History   family history includes CVA in his father; Heart disease in his father.  Prior Rehab/Hospitalizations Has the patient had prior rehab or hospitalizations prior to admission? No  Has the patient had major surgery during 100 days prior to admission? Yes   Current Medications  Current Facility-Administered Medications:  .  0.9 %  sodium chloride infusion, 250 mL, Intravenous, PRN, Earnie Larsson, MD .  0.9 %  sodium chloride infusion, 250 mL, Intravenous, Continuous, Pool, Henry, MD .  0.9 %  sodium chloride infusion, , Intravenous, Continuous, Ashok Pall, MD, Last Rate: 100 mL/hr at 05/15/19 0800, Rate Verify at 05/15/19 0800 .  acetaminophen (TYLENOL) tablet 650 mg, 650 mg, Oral, Q6H PRN **OR** acetaminophen (TYLENOL) suppository 650 mg, 650 mg, Rectal, Q6H PRN, Pool, Mallie Mussel, MD .  bisacodyl (DULCOLAX) suppository 10 mg, 10 mg, Rectal, Daily PRN, Earnie Larsson, MD .  Chlorhexidine Gluconate Cloth 2 % PADS 6 each, 6 each, Topical, Daily, Earnie Larsson, MD, 6 each at 05/16/19 0740 .  dexamethasone (DECADRON) injection 10 mg, 10 mg, Intravenous, Q6H, Pool, Mallie Mussel, MD, 10 mg at 05/16/19 1727 .  diazepam (VALIUM) tablet 5-10 mg, 5-10  mg, Oral, Q6H PRN, Earnie Larsson, MD .  HYDROcodone-acetaminophen (NORCO/VICODIN) 5-325 MG per tablet 1-2 tablet, 1-2 tablet, Oral, Q4H PRN, Earnie Larsson, MD .  HYDROmorphone (DILAUDID) injection 1 mg, 1 mg, Intravenous, Q2H PRN, Earnie Larsson, MD .  iohexol (OMNIPAQUE) 350 MG/ML injection 75 mL, 75 mL, Intravenous, Once PRN, Earnie Larsson, MD .  menthol-cetylpyridinium (CEPACOL) lozenge 3 mg, 1 lozenge, Oral, PRN **OR** phenol (CHLORASEPTIC) mouth spray 1 spray, 1 spray, Mouth/Throat, PRN, Pool, Mallie Mussel, MD .  ondansetron (ZOFRAN) tablet 4 mg, 4 mg, Oral, Q6H PRN **OR** ondansetron (ZOFRAN) injection 4 mg, 4 mg, Intravenous, Q6H PRN, Pool, Mallie Mussel, MD .  oxyCODONE (Oxy IR/ROXICODONE) immediate release tablet 10 mg, 10 mg, Oral, Q3H PRN, Earnie Larsson,  MD .  phenylephrine (NEOSYNEPHRINE) 10-0.9 MG/250ML-% infusion, 0-400 mcg/min, Intravenous, Titrated, Albertha Ghee, MD, Stopped at 05/14/19 1509 .  polyethylene glycol (MIRALAX / GLYCOLAX) packet 17 g, 17 g, Oral, Daily PRN, Pool, Mallie Mussel, MD .  sodium chloride flush (NS) 0.9 % injection 3 mL, 3 mL, Intravenous, Q12H, Pool, Mallie Mussel, MD, 3 mL at 05/16/19 0740 .  sodium chloride flush (NS) 0.9 % injection 3 mL, 3 mL, Intravenous, PRN, Pool, Mallie Mussel, MD .  sodium chloride flush (NS) 0.9 % injection 3 mL, 3 mL, Intravenous, Q12H, Earnie Larsson, MD, 3 mL at 05/16/19 0739 .  sodium chloride flush (NS) 0.9 % injection 3 mL, 3 mL, Intravenous, PRN, Pool, Henry, MD .  sodium phosphate (FLEET) 7-19 GM/118ML enema 1 enema, 1 enema, Rectal, Once PRN, Earnie Larsson, MD  Patients Current Diet:  Diet Order            Diet regular Room service appropriate? Yes; Fluid consistency: Thin  Diet effective now              Precautions / Restrictions Precautions Precautions: Fall Precaution Comments: multiple falls at home Restrictions Weight Bearing Restrictions: No   Has the patient had 2 or more falls or a fall with injury in the past year? Yes  Prior Activity  Level Community (5-7x/wk): active PTA, due to symptom progression has been on short term disability since october from [employer]. used rollator and cane during times of decline  Prior Functional Level Self Care: Did the patient need help bathing, dressing, using the toilet or eating? Independent  Indoor Mobility: Did the patient need assistance with walking from room to room (with or without device)? Independent  Stairs: Did the patient need assistance with internal or external stairs (with or without device)? Independent  Functional Cognition: Did the patient need help planning regular tasks such as shopping or remembering to take medications? [place] / [place] Devices/Equipment: Cane (specify quad or straight), Eyeglasses Home Equipment: Cane - single point  Prior Device Use: Indicate devices/aids used by the patient prior to current illness, exacerbation or injury? Rollator and cane  Current Functional Level Cognition  Overall Cognitive Status: Within Functional Limits for tasks assessed Orientation Level: Oriented X4    Extremity Assessment (includes Sensation/Coordination)  Upper Extremity Assessment: Overall WFL for tasks assessed  Lower Extremity Assessment: Defer to PT evaluation RLE Deficits / Details: AROM WFL, strength hip flexion 3/5, knee extension 4/5, ankle DF 4/5 RLE Sensation: decreased light touch(numbness/tingling all the way up to clavicular area, worse distal) RLE Coordination: decreased fine motor, decreased gross motor LLE Deficits / Details: AAROM WFL, strength hip flexion 3-/5, knee extension 4-/5, ankle DF 3+/5 LLE Sensation: decreased light touch(numbness/tingling all the way up to clavicular area, worse distal) LLE Coordination: decreased gross motor, decreased fine motor    ADLs  Overall ADL's : Needs assistance/impaired Eating/Feeding: Modified independent, Sitting Grooming: Minimal assistance, Standing,  Wash/dry face, Oral care Grooming Details (indicate cue type and reason): assist for standing balance Upper Body Bathing: Set up, Min guard, Sitting Lower Body Bathing: Minimal assistance, Sit to/from stand Upper Body Dressing : Set up, Min guard, Sitting Lower Body Dressing: Minimal assistance, Moderate assistance, Sit to/from stand Toilet Transfer: Minimal assistance, Ambulation, RW, Cueing for sequencing, Cueing for safety Toileting- Clothing Manipulation and Hygiene: Minimal assistance, Sit to/from stand Functional mobility during ADLs: Minimal assistance, Rolling walker General ADL Comments: cues for sequencing RW use with mobility tasks    Mobility  Overal  bed mobility: Needs Assistance Bed Mobility: Supine to Sit, Sit to Sidelying Rolling: Min assist Sidelying to sit: Min assist Supine to sit: HOB elevated, Supervision Sit to sidelying: Supervision General bed mobility comments: moved feet over and pulled up on rail to sit; to supine via sidelying increased time to lift L leg    Transfers  Overall transfer level: Needs assistance Equipment used: Rolling walker (2 wheeled) Transfers: Sit to/from Stand Sit to Stand: Min assist General transfer comment: pulls up on foot board on bed, assist for balance    Ambulation / Gait / Stairs / Wheelchair Mobility  Ambulation/Gait Ambulation/Gait assistance: Min guard, Min assist Gait Distance (Feet): 320 Feet Assistive device: Rolling walker (2 wheeled) Gait Pattern/deviations: Step-through pattern, Decreased stride length, Trunk flexed, Narrow base of support General Gait Details: cues for hip extension and more knee flexion as initially with stiff leg gait with some circumduction; minguard for balance; mostly level tile, about 15' incline in hallway with min a    Posture / Balance Balance Overall balance assessment: Needs assistance Sitting-balance support: Feet supported Sitting balance-Leahy Scale: Good Standing balance support:  Bilateral upper extremity supported Standing balance-Leahy Scale: Poor Standing balance comment: UE support for standing balance, min A for dynamic balance High Level Balance Comments: at sink in room performed anterior/posterior rocking up on toes and back on heels with min A cues for spinal precautions    Special needs/care consideration BiPAP/CPAP : no CPM : no Continuous Drip IV : 0.9% sodium chloride infusion, 0.9% sodium chloride infusion  Dialysis : no        Days : no Life Vest : no Oxygen : no, on RA Special Bed : no Trach Size : no Wound Vac (area) : no      Location : no Skin: surgical incision to back           Bowel mgmt: last BM 05/15/19 Bladder mgmt: continent Diabetic mgmt: no Behavioral consideration : no Chemo/radiation : consideration of radiation in a few weeks.    Previous Home Environment (from acute therapy documentation) Living Arrangements: Alone Type of Home: House Home Layout: One level Home Access: Stairs to enter Entrance Stairs-Rails: Right Entrance Stairs-Number of Steps: "couple" Bathroom Shower/Tub: Chiropodist: Standard Home Care Services: No  Discharge Living Setting Plans for Discharge Living Setting: Patient's home, Alone, Other (Comment)(sister can check on him intermittantly ) Type of Home at Discharge: House Discharge Home Layout: One level Discharge Home Access: Stairs to enter Entrance Stairs-Rails: Left Entrance Stairs-Number of Steps: 2-3 steps Discharge Bathroom Shower/Tub: Tub/shower unit Discharge Bathroom Toilet: Standard Discharge Bathroom Accessibility: Yes How Accessible: Accessible via walker Does the patient have any problems obtaining your medications?: No  Social/Family/Support Systems Patient Roles: Other (Comment)(worked at [employer] until decline in October) Contact Information: sister Santiago Glad: [phone] Anticipated Caregiver: sister Anticipated Caregiver's Contact Information: see  above Ability/Limitations of Caregiver: Supervision -can only check on 1-2x/day (can make sure meals prepped).  Caregiver Availability: Intermittent(1-2x/day) Discharge Plan Discussed with Primary Caregiver: Yes(pt and sister) Is Caregiver In Agreement with Plan?: Yes Does Caregiver/Family have Issues with Lodging/Transportation while Pt is in Rehab?: No  Goals/Additional Needs Patient/Family Goal for Rehab: PT/OT: Mod I; SLP: NA Expected length of stay: 6-9 days Cultural Considerations: NA Dietary Needs: regular diet, thin liquids Equipment Needs: TBD Special Service Needs: may need radiation therapy Pt/Family Agrees to Admission and willing to participate: Yes Program Orientation Provided & Reviewed with Pt/Caregiver Including Roles  & Responsibilities: Yes(pt and his  sister )  Barriers to Discharge: Home environment access/layout, Lack of/limited family support, Pending chemo/radiation  Barriers to Discharge Comments: steps to enter home; limited supervision available. may need follow up radiation treatment  Decrease burden of Care through IP rehab admission: NA  Possible need for SNF placement upon discharge: Not anticipated; Anticipate pt can get to Mod I/supervision level which is a level he can DC home to. Pt has a sister who lives nearby who can provide daily check ins. Pt is highly motivated to return to independence.   Patient Condition: I have reviewed medical records from [facility], spoken with NP, and patient and family member. I met with patient at the bedside for inpatient rehabilitation assessment.  Patient will benefit from ongoing PT and OT, can actively participate in 3 hours of therapy a day 5 days of the week, and can make measurable gains during the admission.  Patient will also benefit from the coordinated team approach during an Inpatient Acute Rehabilitation admission.  The patient will receive intensive therapy as well as Rehabilitation physician,  nursing, social worker, and care management interventions.  Due to safety, skin/wound care, disease management, medication administration, pain management and patient education the patient requires 24 hour a day rehabilitation nursing.  The patient is currently Min G/Min A with mobility and Mod I to Mod A for basic ADLs.  Discharge setting and therapy post discharge at home with home health is anticipated.  Patient has agreed to participate in the Acute Inpatient Rehabilitation Program and will admit tomorrow.  Preadmission Screen Completed By:  Raechel Ache, 05/16/2019 6:14 PM ______________________________________________________________________   Discussed status with Dr. Ranell Patrick on 05/16/19 at 3:25PM and received approval for admission tomorrow, 05/17/19.  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 Does the need for close, 24 hr/day Medical supervision in concert with the patient's rehab needs make it unreasonable for this patient to be served in a less intensive setting? Yes 2. Co-Morbidities requiring supervision/potential complications: HLD, tobacco abuse, large follicular thyroid mass 3. Due to bladder management, bowel management, safety, skin/wound care, disease management, medication administration, pain management and patient education, does the patient require 24 hr/day rehab nursing? Yes 4. Does the patient require coordinated care of a physician, rehab nurse, PT, OT, and SLP to address physical and functional deficits in the context of the above medical diagnosis(es)? Yes Addressing deficits in the following areas: balance, endurance, locomotion, strength, transferring, bowel/bladder control, bathing, dressing, feeding, grooming, toileting and psychosocial support 5. Can the patient actively participate in an intensive therapy program of at least 3 hrs of therapy 5 days  a week? Yes 6. The potential for patient to make measurable gains while on inpatient rehab is excellent 7. Anticipated functional outcomes upon discharge from inpatient rehab: modified independent PT, modified independent OT, modified independent SLP 8. Estimated rehab length of stay to reach the above functional goals is: 7-8 days 9. Anticipated discharge destination: Home 10. Overall Rehab/Functional Prognosis: excellent   MD Signature: Leeroy Cha, MD

## 2019-05-15 NOTE — Progress Notes (Signed)
Postop day 2.  Overall doing well.  Pain well controlled.  No radiating pain.  Lower extremity weakness and sensory loss much improved.  Able to stand and ambulate with assistance during therapy.  Voiding well.  Awake and alert.  Oriented and appropriate.  Motor and sensory function intact in both upper extremities.  Motor examination with 4+/5 motor strength in both lower extremities.  Sensory examination remains improved preop.  Wound clean and dry.  Chest and abdomen benign.  Widely metastatic disease with unknown primary.  Patient status post thoracic decompression and tumor blood debulking.  Continue efforts at mobilization.  Patient would likely benefit from inpatient rehabilitation.  Radiation oncology consult pending.  I appreciate medical oncology's recommendation.  I would not recommend anticoagulation at least for another few days because of his extensive tumor bleeding and the risk of epidural hematoma.

## 2019-05-15 NOTE — Consult Note (Signed)
Radiation Oncology         ([phone]) [phone] ________________________________  Name: Tyrone Washington        MRN: [id number]  Date of Service: 05/14/19  DOB: 1955-06-13  CC:O'Sullivan, Lenna Sciara, NP    REFERRING PHYSICIAN: Dr. Annette Stable  DIAGNOSIS: The primary encounter diagnosis was Bilateral leg numbness. Diagnoses of Malignant neoplasm metastatic to bone [facility]) and Elective surgery were also pertinent to this visit.   HISTORY OF PRESENT ILLNESS: Tyrone Washington is a 64 y.o. male seen at the request of Dr. Annette Stable for a newly noted thoracic spine cord compression.  The patient has a history of a nodular goiter dating back about 10 years ago.  He has had multiple assessments of this and apparently had a hemithyroidectomy in 2010.  The patient shared his operative note and pathology report with Dr. Hendricks Limes when he was seen earlier this past month and it appeared he had had a hemithyroidectomy and frozen section was negative for malignancy but showed a follicular lesion.  He apparently had a complicated postoperative recovery including hematoma hematoma that had to be evacuated. 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  It sounds as though the patient had been evaluated on several occasions but has not had routine follow-up care.  He had slowly started noticing changes in the growth of this slowly over time and had been wanting to have procedures for orthopedic issues but he was sent to ENT for evaluation and was seen on 04/25/19 by Dr. Hendricks Limes to discuss thyroidectomy. He had a CT neck on 05/07/19 that revealed the right lobe of the thyroid measuring 4.8 x 6.8 x 7.1 cm, and enhancing thyroid tissue in the right paratracheal region, and the left lobe measuring 5.7 x 7.8 x 8.7 cm. No adenopathy in the neck was noted. In the midst of getting set up for surgical resection, he had also been experiencing progressive weakness from the waist down.   Apparently the symptoms started in October of last year, he has had multiple falls and has been still trying to work in the midst of this.  He was being referred to neurology by his primary provider for assessment of this, upon evaluation on 05/12/19, Dr. Posey Pronto, the neurologist seeing him was concerned that he may have symptoms of spinal cord compression.  He was sent urgently to the emergency room and underwent MRI of the thoracic lumbar and cervical spine.  No abnormal findings suggesting malignancy were noted in the cervical spine. In the thoracic spine however he had hyperintensity and enhancement at T7 extending to the posterior elements on the left with additional involvement of the inferior left aspect of the T6 vertebral body, extraosseous extension at T7 including the ventral and left lateral spinal canal effacement of the left lateral subarachnoid space without cord compression and effacement of the left T7-T8 foramen and partial effacement at T6-T7 foramen.  There was hyperintensity and enhancement at T10 extending into the left greater than right elements posteriorly involving the left 10th rib and involving the left aspect of T9 and posterior elements with significant extraosseous extension inferior at T9 and T10 including left ventral lateral and dorsal epidural extension and cord displacement with compression with likely abnormal cord signal.  Further distal imaging of the lumbar spine revealed a 13 mm lesion in the posterior inferior aspect of the S1 sacral segment with T1 hypointensity and avid enhancement as well as a 15 mm  lesion in the right sacral alla, mild degenerative endplate irregularities from L5-S1, and no evidence of abnormality in the distal aspect of his spinal cord.  Given the symptoms he was having which sound to have been progressive weakness, numbness, difficulty ambulating with leg stiffness and ataxia, he was taken urgently to the operating room for spinal cord decompression  including thoracic level 10 laminectomy on 05/13/2019.  Final pathology is pending at this time.  We are asked to contact the patient to discuss recommendations for postoperative radiotherapy.  He has met with Dr. Alen Blew, who has recommended systemic therapy once the primary diagnosis of malignancy is confirmed.    PREVIOUS RADIATION THERAPY: No   PAST MEDICAL HISTORY:  Past Medical History:  Diagnosis Date  . Allergy    allergic rhinitis  . Arthritis   . B12 deficiency 04/10/2019  . History of chicken pox   . Hyperlipidemia    no meds now       PAST SURGICAL HISTORY: Past Surgical History:  Procedure Laterality Date  . COLONOSCOPY    . THYROID SURGERY     "Years ago"     FAMILY HISTORY:  Family History  Problem Relation Age of Onset  . Heart disease Father        patient is unsure of history  . CVA Father   . Heart attack Neg Hx   . Colon cancer Neg Hx   . Esophageal cancer Neg Hx   . Rectal cancer Neg Hx   . Stomach cancer Neg Hx      SOCIAL HISTORY:  reports that he has been smoking. He has never used smokeless tobacco. He reports current alcohol use of about 7.0 standard drinks of alcohol per week. He reports that he does not use drugs.   ALLERGIES: Patient has no known allergies.   MEDICATIONS:  Current Facility-Administered Medications  Medication Dose Route Frequency Provider Last Rate Last Admin  . 0.9 %  sodium chloride infusion  250 mL Intravenous PRN Earnie Larsson, MD      . 0.9 %  sodium chloride infusion  250 mL Intravenous Continuous Pool, Mallie Mussel, MD      . 0.9 %  sodium chloride infusion   Intravenous Continuous Ashok Pall, MD 100 mL/hr at 05/14/19 1508 New Bag at 05/14/19 1508  . acetaminophen (TYLENOL) tablet 650 mg  650 mg Oral Q6H PRN Earnie Larsson, MD       Or  . acetaminophen (TYLENOL) suppository 650 mg  650 mg Rectal Q6H PRN Earnie Larsson, MD      . bisacodyl (DULCOLAX) suppository 10 mg  10 mg Rectal Daily PRN Earnie Larsson, MD      .  Chlorhexidine Gluconate Cloth 2 % PADS 6 each  6 each Topical Daily Earnie Larsson, MD   6 each at 05/14/19 0848  . dexamethasone (DECADRON) injection 10 mg  10 mg Intravenous Q6H Earnie Larsson, MD   10 mg at 05/14/19 1234  . diazepam (VALIUM) tablet 5-10 mg  5-10 mg Oral Q6H PRN Earnie Larsson, MD      . HYDROcodone-acetaminophen (NORCO/VICODIN) 5-325 MG per tablet 1-2 tablet  1-2 tablet Oral Q4H PRN Earnie Larsson, MD      . HYDROmorphone (DILAUDID) injection 1 mg  1 mg Intravenous Q2H PRN Earnie Larsson, MD      . iohexol (OMNIPAQUE) 350 MG/ML injection 75 mL  75 mL Intravenous Once PRN Earnie Larsson, MD      . menthol-cetylpyridinium (CEPACOL) lozenge 3 mg  1  lozenge Oral PRN Earnie Larsson, MD       Or  . phenol (CHLORASEPTIC) mouth spray 1 spray  1 spray Mouth/Throat PRN Earnie Larsson, MD      . ondansetron [facility]) tablet 4 mg  4 mg Oral Q6H PRN Earnie Larsson, MD       Or  . ondansetron (ZOFRAN) injection 4 mg  4 mg Intravenous Q6H PRN Earnie Larsson, MD      . oxyCODONE (Oxy IR/ROXICODONE) immediate release tablet 10 mg  10 mg Oral Q3H PRN Earnie Larsson, MD      . phenylephrine (NEOSYNEPHRINE) 10-0.9 MG/250ML-% infusion  0-400 mcg/min Intravenous Titrated Albertha Ghee, MD   Stopped at 05/14/19 1510  . polyethylene glycol (MIRALAX / GLYCOLAX) packet 17 g  17 g Oral Daily PRN Earnie Larsson, MD      . sodium chloride flush (NS) 0.9 % injection 3 mL  3 mL Intravenous Q12H Earnie Larsson, MD   3 mL at 05/14/19 0828  . sodium chloride flush (NS) 0.9 % injection 3 mL  3 mL Intravenous PRN Earnie Larsson, MD      . sodium chloride flush (NS) 0.9 % injection 3 mL  3 mL Intravenous Q12H Earnie Larsson, MD   3 mL at 05/14/19 0828  . sodium chloride flush (NS) 0.9 % injection 3 mL  3 mL Intravenous PRN Earnie Larsson, MD      . sodium phosphate (FLEET) 7-19 GM/118ML enema 1 enema  1 enema Rectal Once PRN Earnie Larsson, MD         REVIEW OF SYSTEMS: On review of systems the patient reports that he is doing quite well overall.  He states  that he is able to feel his feet now and has been able to wiggle his toes and actually able to move his legs much more so than prior to surgery.  He states that he is anticipating rehabilitation prior to hospital discharge.  He is also interested in talking with social work to determine his options for disability.  He denies any difficulty with bowel or bladder function, shortness of breath or chest pain.  He denies any significant trouble swallowing regarding his thyroid hypertrophy.  No other complaints are verbalized    PHYSICAL EXAM:  Wt Readings from Last 3 Encounters:  05/13/19 158 lb 15.2 oz (72.1 kg)  05/12/19 160 lb (72.6 kg)  04/08/19 161 lb (73 kg)   Temp Readings from Last 3 Encounters:  05/14/19 99 F (37.2 C) (Oral)  04/08/19 (!) 96.5 F (35.8 C) (Oral)  03/25/19 (!) 97.5 F (36.4 C) (Temporal)   BP Readings from Last 3 Encounters:  05/14/19 102/69  05/12/19 103/68  04/08/19 108/71   Pulse Readings from Last 3 Encounters:  05/14/19 85  05/12/19 84  04/08/19 84   Pain Assessment Pain Score: 0-No pain/10  Unable to examine due to encounter type.  ECOG = 2  0 - Asymptomatic (Fully active, able to carry on all predisease activities without restriction)  1 - Symptomatic but completely ambulatory (Restricted in physically strenuous activity but ambulatory and able to carry out work of a light or sedentary nature. For example, light housework, office work)  2 - Symptomatic, <50% in bed during the day (Ambulatory and capable of all self care but unable to carry out any work activities. Up and about more than 50% of waking hours)  3 - Symptomatic, >50% in bed, but not bedbound (Capable of only limited self-care, confined to bed or chair 50%  or more of waking hours)  4 - Bedbound (Completely disabled. Cannot carry on any self-care. Totally confined to bed or chair)  5 - Death   Eustace Pen MM, Creech RH, Tormey DC, et al. [phone]). "Toxicity and response criteria of the  [facility]". [place] Oncol. 5 (6): 649-55    LABORATORY DATA:  Lab Results  Component Value Date   WBC 16.6 (H) 05/13/2019   HGB 8.0 (L) 05/14/2019   HCT 24.4 (L) 05/14/2019   MCV 89.8 05/13/2019   PLT 300 05/13/2019   Lab Results  Component Value Date   NA 137 05/14/2019   K 4.3 05/14/2019   CL 108 05/14/2019   CO2 21 (L) 05/14/2019   Lab Results  Component Value Date   ALT 12 05/12/2019   AST 19 05/12/2019   ALKPHOS 55 05/12/2019   BILITOT 0.5 05/12/2019      RADIOGRAPHY: DG THORACOLUMABAR SPINE  Result Date: 05/13/2019 CLINICAL DATA:  T10 laminectomy. EXAM: THORACOLUMBAR SPINE 1V COMPARISON:  May 12, 2019. FINDINGS: Two intraoperative cross-table lateral projections were obtained of the lumbar spine. The first image demonstrates surgical probe directed toward T12 level. The second image demonstrates surgical retractor posterior to T10-11 disc space. IMPRESSION: Surgical localization as described above. Electronically Signed   By: Marijo Conception M.D.   On: 05/13/2019 16:25   CT CHEST W CONTRAST  Result Date: 05/13/2019 CLINICAL DATA:  64 year old male with history of cancer of unknown primary. EXAM: CT CHEST, ABDOMEN, AND PELVIS WITH CONTRAST TECHNIQUE: Multidetector CT imaging of the chest, abdomen and pelvis was performed following the standard protocol during bolus administration of intravenous contrast. CONTRAST:  140m OMNIPAQUE IOHEXOL 300 MG/ML  SOLN COMPARISON:  No priors. FINDINGS: CT CHEST FINDINGS Cardiovascular: Heart size is normal. There is no significant pericardial fluid, thickening or pericardial calcification. No atherosclerotic calcifications in the thoracic aorta or the coronary arteries. Filling defects are present within segmental and subsegmental sized pulmonary artery branches in the lungs bilaterally, indicative of pulmonary embolism. The largest of these is in the right lower lobe (axial image 39 of series 3). At this time,  these appear to be nonocclusive. Mediastinum/Nodes: Large heterogeneously enhancing thyroid mass incompletely imaged at the level of the thoracic inlet measuring at least 11.8 x 9.8 cm (axial image 1 of series 3). No pathologically enlarged mediastinal or hilar lymph nodes. Esophagus is unremarkable in appearance. No axillary lymphadenopathy. Lungs/Pleura: Multiple small pulmonary nodules are scattered throughout the lungs bilaterally, highly concerning for widespread metastatic disease, largest of which measure up to 9 mm in the left lower lobe (axial image 122 of series 5, and 11 mm in the right lower lobe (axial image 121 of series 5). Small right pleural effusion which is partially loculated laterally, but predominantly layers dependently. No left pleural effusion. No acute consolidative airspace disease. Musculoskeletal: Lytic lesions are noted in the thoracic spine involving both T7 and T10. The largest lesion involves the posterior aspect of the T10 vertebral body, extending through the pedicle into the lateral and posterior elements, as well as involving the adjacent head and neck of the left tenth rib, measuring 5.1 x 3.8 cm (axial image 102 of series 5), encroaching upon and significantly narrowing the central spinal canal. The smaller lesion is at T7 measuring approximately 3.9 x 2.7 cm also involving the left side of the vertebral body extending into the pedicle and left transverse process, as well as the left paraspinal soft tissues with minimal encroachment upon  the central spinal canal. Both of these lesions demonstrate extensive enhancement. CT ABDOMEN PELVIS FINDINGS Hepatobiliary: No suspicious cystic or solid hepatic lesions. No intra or extrahepatic biliary ductal dilatation. Gallbladder is normal in appearance. Pancreas: No pancreatic mass. No pancreatic ductal dilatation. No pancreatic or peripancreatic fluid collections or inflammatory changes. Spleen: Unremarkable. Adrenals/Urinary Tract:  Subcentimeter low-attenuation lesion in the interpolar region of the right kidney, too small to characterize, but statistically likely to represent a tiny cyst. Left kidney and bilateral adrenal glands are normal in appearance. No hydroureteronephrosis. Urinary bladder is normal in appearance. Stomach/Bowel: Normal appearance of the stomach. No pathologic dilatation of small bowel or colon. Normal appendix. Vascular/Lymphatic: Atherosclerotic calcifications in the pelvic vasculature. No aneurysm or dissection noted in the abdominal or pelvic vasculature. No lymphadenopathy noted in the abdomen or pelvis. Reproductive: Prostate gland and seminal vesicles are unremarkable. Other: No significant volume of ascites.  No pneumoperitoneum. Musculoskeletal: There are no aggressive appearing lytic or blastic lesions noted in the visualized portions of the skeleton. IMPRESSION: 1. Widespread metastatic disease to the lungs, small right pleural effusion which is partially loculated and may be malignant, and metastatic lesions in the thoracic spine, as detailed above. A primary source is uncertain, however, the lesions of the thoracic spine demonstrate avid enhancement, which is very similar to the enhancement characteristics of the large thyroid mass. 2. Multiple segmental and subsegmental sized filling defects in the pulmonary arteries bilaterally, compatible with pulmonary embolism. 3. Large heterogeneously enhancing thyroid mass incompletely imaged. Although this may simply represent a thyroid goiter, the possibility of primary thyroid neoplasm should be considered, particularly in light of the similar enhancement characteristics of the spinal lesions (discussed above). 4. Additional incidental findings, as above. Electronically Signed   By: Vinnie Langton M.D.   On: 05/13/2019 08:58   MR Cervical Spine W or Wo Contrast  Result Date: 05/12/2019 CLINICAL DATA:  Bilateral lower extremity weakness, abnormal lumbar spine  EXAM: MRI CERVICAL SPINE WITHOUT AND WITH CONTRAST TECHNIQUE: Multiplanar and multiecho pulse sequences of the cervical spine, to include the craniocervical junction and cervicothoracic junction, were obtained without and with intravenous contrast. CONTRAST:  32m GADAVIST GADOBUTROL 1 MMOL/ML IV SOLN COMPARISON:  None. FINDINGS: Alignment: Anteroposterior alignment is maintained. Vertebrae: Vertebral body heights are preserved. There is no substantial marrow edema or suspicious osseous lesion. Cord: No abnormal signal. Posterior Fossa, vertebral arteries, paraspinal tissues: Markedly enlarged and heterogeneous thyroid is incompletely evaluated. Disc levels: Minor disc bulges are present. Mild facet and uncovertebral hypertrophy. There is no high-grade degenerative stenosis. IMPRESSION: No evidence cervical spine metastatic disease. Electronically Signed   By: PMacy MisM.D.   On: 05/12/2019 21:50   MR THORACIC SPINE W WO CONTRAST  Result Date: 05/12/2019 CLINICAL DATA:  Lower extremity weakness and numbness, abnormal lumbar spine EXAM: MRI THORACIC WITHOUT AND WITH CONTRAST TECHNIQUE: Multiplanar and multiecho pulse sequences of the thoracic spine were obtained without and with intravenous contrast. CONTRAST:  720mGADAVIST GADOBUTROL 1 MMOL/ML IV SOLN COMPARISON:  None. FINDINGS: Motion artifact is present. Alignment: Thoracic kyphosis is preserved. No substantial anteroposterior listhesis. Vertebrae: There is abnormal STIR hyperintensity and enhancement at the T7 level extending into the posterior elements on the left. Additional involvement of the inferior left aspect of the T6 vertebral body. There is extraosseous extension at T7 including the ventral and left lateral spinal canal. Effacement of the left lateral subarachnoid space without cord compression. There is also effacement of the left T7-T8 foramen and partial effacement of the  left T6-T7 foramen. Abnormal STIR hyperintensity and enhancement at  the T10 level extending into the left much greater than right posterior elements with involvement of the left tenth rib. Additional involvement of the left aspect of the T9 vertebral body and posterior elements. Significant extraosseous extension at inferior T9 and T10 including left ventral, lateral, and dorsal epidural extension with cord displacement and compression. There is likely abnormal cord signal. Effacement of the left T9-T10 and T10-T11 foramina. Cord:  As above.  Otherwise unremarkable. Paraspinal and other soft tissues: Partially imaged significantly enlarged and heterogeneous thyroid is incompletely evaluated. Disc levels: Mild degenerative disc disease and facet arthropathy. There is no high-grade degenerative stenosis. IMPRESSION: Abnormal signal at T7 and T10 with additional less pronounced adjacent involvement at T6 and T9 consistent with metastatic disease. Extraosseous extension is present at both levels as detailed above. Most notably, there is severe canal stenosis with cord compression and abnormal cord signal at the inferior T9 and T10 level. These results were called by telephone at the time of interpretation on 05/12/2019 at 9:43 pm to provider Madilyn Hook, who verbally acknowledged these results. Electronically Signed   By: Macy Mis M.D.   On: 05/12/2019 21:44   MR Lumbar Spine W Wo Contrast  Result Date: 05/12/2019 CLINICAL DATA:  Lumbar radiculopathy, no red flags. Additional history provided: Numbness of the feet and legs which began October 2020. EXAM: MRI LUMBAR SPINE WITHOUT AND WITH CONTRAST TECHNIQUE: Multiplanar and multiecho pulse sequences of the lumbar spine were obtained without and with intravenous contrast. CONTRAST:  94m GADAVIST GADOBUTROL 1 MMOL/ML IV SOLN COMPARISON:  No pertinent prior studies available for comparison. FINDINGS: Segmentation: For the purposes of this dictation, five lumbar vertebrae are assumed and the caudal most well-formed intervertebral  disc is designated L5-S1. Alignment: Straightening of the expected lumbar lordosis. No significant spondylolisthesis. Vertebrae: Vertebral body height is maintained. There is a 13 mm lesion within the posteroinferior aspect of the S1 sacral segment which demonstrates T1 hypointensity, T2/STIR hyperintensity and avid enhancement (series 3, image 7) (series 7, image 7). Additional 15 mm enhancing lesion within the right sacral ala (series 6, image 35) (series 8, image 35). Mild L5-S1 degenerative endplate irregularity with mixed degenerative endplate marrow signal and a small Schmorl node within the S1 superior endplate. Conus medullaris and cauda equina: Conus extends to the L1-L2 level. No signal abnormality within the visualized distal spinal cord. No abnormal enhancement of the visualized distal spinal cord or cauda equina nerve roots. Paraspinal and other soft tissues: No abnormality identified within included portions of the abdomen/retroperitoneum. Paraspinal soft tissues within normal limits. Disc levels: Moderate L5-S1 disc degeneration. Mild-to-moderate disc degeneration is also present at L3-L4 and L4-L5. T12-L1: This level is not included on axial imaging. No disc herniation. No significant canal or foraminal stenosis. L1-L2: No disc herniation. No significant canal or foraminal stenosis. Small posteriorly projecting synovial facet cyst on the left. L2-L3: No disc herniation. No significant canal or foraminal stenosis. L3-L4: Disc bulge with endplate spurring. Mild facet arthrosis/ligamentum flavum hypertrophy. Mild relative spinal canal narrowing without nerve root impingement. Mild bilateral neural foraminal narrowing. L4-L5: Disc bulge with endplate spurring. Mild facet arthrosis/ligamentum flavum hypertrophy. Mild relative spinal canal narrowing without nerve root impingement. Mild bilateral neural foraminal narrowing. L5-S1: Disc bulge with endplate spurring. Mild facet arthrosis. No significant  spinal canal stenosis. Mild left neural foraminal narrowing. IMPRESSION: Two enhancing osseous lesions, one measuring 13 mm within the posterior inferior S1 sacral segment, and the other  measuring 15 mm within the right sacral ala. Findings are highly suspicious for osseous metastatic disease. Given the provided history of numbness from the waist down, consider contrast-enhanced MRI of the thoracic spine to assess for metastatic disease and cord compression at these levels. Lumbar spondylosis as detailed and greatest at L5-S1. No more than mild spinal canal or neural foraminal narrowing within the lumbar spine. Electronically Signed   By: Kellie Simmering DO   On: 05/12/2019 13:27   CT ABDOMEN PELVIS W CONTRAST  Result Date: 05/13/2019 CLINICAL DATA:  64 year old male with history of cancer of unknown primary. EXAM: CT CHEST, ABDOMEN, AND PELVIS WITH CONTRAST TECHNIQUE: Multidetector CT imaging of the chest, abdomen and pelvis was performed following the standard protocol during bolus administration of intravenous contrast. CONTRAST:  198m OMNIPAQUE IOHEXOL 300 MG/ML  SOLN COMPARISON:  No priors. FINDINGS: CT CHEST FINDINGS Cardiovascular: Heart size is normal. There is no significant pericardial fluid, thickening or pericardial calcification. No atherosclerotic calcifications in the thoracic aorta or the coronary arteries. Filling defects are present within segmental and subsegmental sized pulmonary artery branches in the lungs bilaterally, indicative of pulmonary embolism. The largest of these is in the right lower lobe (axial image 39 of series 3). At this time, these appear to be nonocclusive. Mediastinum/Nodes: Large heterogeneously enhancing thyroid mass incompletely imaged at the level of the thoracic inlet measuring at least 11.8 x 9.8 cm (axial image 1 of series 3). No pathologically enlarged mediastinal or hilar lymph nodes. Esophagus is unremarkable in appearance. No axillary lymphadenopathy.  Lungs/Pleura: Multiple small pulmonary nodules are scattered throughout the lungs bilaterally, highly concerning for widespread metastatic disease, largest of which measure up to 9 mm in the left lower lobe (axial image 122 of series 5, and 11 mm in the right lower lobe (axial image 121 of series 5). Small right pleural effusion which is partially loculated laterally, but predominantly layers dependently. No left pleural effusion. No acute consolidative airspace disease. Musculoskeletal: Lytic lesions are noted in the thoracic spine involving both T7 and T10. The largest lesion involves the posterior aspect of the T10 vertebral body, extending through the pedicle into the lateral and posterior elements, as well as involving the adjacent head and neck of the left tenth rib, measuring 5.1 x 3.8 cm (axial image 102 of series 5), encroaching upon and significantly narrowing the central spinal canal. The smaller lesion is at T7 measuring approximately 3.9 x 2.7 cm also involving the left side of the vertebral body extending into the pedicle and left transverse process, as well as the left paraspinal soft tissues with minimal encroachment upon the central spinal canal. Both of these lesions demonstrate extensive enhancement. CT ABDOMEN PELVIS FINDINGS Hepatobiliary: No suspicious cystic or solid hepatic lesions. No intra or extrahepatic biliary ductal dilatation. Gallbladder is normal in appearance. Pancreas: No pancreatic mass. No pancreatic ductal dilatation. No pancreatic or peripancreatic fluid collections or inflammatory changes. Spleen: Unremarkable. Adrenals/Urinary Tract: Subcentimeter low-attenuation lesion in the interpolar region of the right kidney, too small to characterize, but statistically likely to represent a tiny cyst. Left kidney and bilateral adrenal glands are normal in appearance. No hydroureteronephrosis. Urinary bladder is normal in appearance. Stomach/Bowel: Normal appearance of the stomach. No  pathologic dilatation of small bowel or colon. Normal appendix. Vascular/Lymphatic: Atherosclerotic calcifications in the pelvic vasculature. No aneurysm or dissection noted in the abdominal or pelvic vasculature. No lymphadenopathy noted in the abdomen or pelvis. Reproductive: Prostate gland and seminal vesicles are unremarkable. Other: No significant volume  of ascites.  No pneumoperitoneum. Musculoskeletal: There are no aggressive appearing lytic or blastic lesions noted in the visualized portions of the skeleton. IMPRESSION: 1. Widespread metastatic disease to the lungs, small right pleural effusion which is partially loculated and may be malignant, and metastatic lesions in the thoracic spine, as detailed above. A primary source is uncertain, however, the lesions of the thoracic spine demonstrate avid enhancement, which is very similar to the enhancement characteristics of the large thyroid mass. 2. Multiple segmental and subsegmental sized filling defects in the pulmonary arteries bilaterally, compatible with pulmonary embolism. 3. Large heterogeneously enhancing thyroid mass incompletely imaged. Although this may simply represent a thyroid goiter, the possibility of primary thyroid neoplasm should be considered, particularly in light of the similar enhancement characteristics of the spinal lesions (discussed above). 4. Additional incidental findings, as above. Electronically Signed   By: Vinnie Langton M.D.   On: 05/13/2019 08:58       IMPRESSION/PLAN: 1. 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  Dr. Lisbeth Renshaw has reviewed the findings from the patient's imaging, there appear to be 3 areas in total affected by this process which we suspect to be malignant.  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  We will follow-up with the results of pathology  when available.  Unfortunately due to the multifocal sites of his disease he would not be a good candidate for stereotactic radiotherapy however he would be a good candidate for palliative radiotherapy.  With the location of his disease, it would be very difficult to plan to separate areas of treatment in the thoracic spine to Dr. Lisbeth Renshaw would offer a palliative course to levels T6-T11 as well as to the sacrum covering S1 and the sacral ala.  We would anticipate proceeding in this manner following a few weeks of postoperative recovery to ensure that he is fully healed at the skin.  If he were to develop progressive neurologic changes however this could be reconsidered as far as timing.  He and I spoke about the options of radiotherapy, the logistics logistics, delivery and course of treatment.  We would anticipate a 3-week course of treatment due to the plan for ongoing systemic therapy with Dr. Alen Blew once his primary is determined.  We discussed the risks, benefits, short and long-term effects of therapy and the patient is interested in proceeding at the appropriate interval.  We will coordinate simulation and anticipate this to occur the week of 05/26/2019.  In a visit lasting 75 minutes, greater than 50% of the time was spent by phone and discussing the patient's condition, in preparation for the discussion, and coordinating the patient's care.    Carola Rhine, PAC

## 2019-05-15 NOTE — H&P (Signed)
Physical Medicine and Rehabilitation Admission H&P    Chief Complaint  Patient presents with  . Back Pain  : HPI: Tyrone Washington is a 64 year old right-handed male with history of hyperlipidemia and tobacco abuse as well as 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  Per chart review patient lives alone 1 level home with a few steps to entry.  Independent with assistive device however patient had reported multiple falls.  Patient presented 05/13/2019 with 80-month history of progressive lower extremity weakness and sensory loss.  He denied any bowel or bladder disturbances.  Admission chemistries with hemoglobin of 12, chemistries unremarkable, urinalysis negative nitrite, SARS coronavirus negative.  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  Vertebral body and right sided articular masses however were spared.  He does have some metastatic disease thoracic spine T7 level that was noncompressive as well as in the sacrum.  Staging CT scan obtained March 2 included CT chest abdomen pelvis showing multiple small pulmonary nodules scattered in the lungs bilaterally with the largest of which measuring 9 mm in the left lower lobe and 11 mm in the right lower lobe.  He was noted to have lytic lesions in the thoracic spine including T7 and T10.  Multiple segmental and subsegmental filling defect in the pulmonary arteries compatible with pulmonary embolism.  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 Annette Stable.  Plan is to begin Xarelto for pulmonary emboli postop day 10 beginning 05/23/2019.  Oncology services consulted Dr. Alen Blew 05/14/2019 and radiation  Oncology for advanced malignancies with chart reviewed recommendations were for follow-up cancer center on discharge with pathology report pending.  Patient currently remains on Decadron protocol.  Acute blood loss anemia 8.0.  Review of Systems  Constitutional: Negative for chills and fever.  HENT: Negative for hearing loss.   Eyes: Negative for blurred vision.  Respiratory: Negative for cough and shortness of breath.   Cardiovascular: Negative for palpitations.  Gastrointestinal: Positive for constipation. Negative for heartburn, nausea and vomiting.  Genitourinary: Positive for urgency. Negative for dysuria, flank pain and hematuria.  Musculoskeletal: Positive for joint pain.  Skin: Negative for rash.  Neurological: Positive for sensory change and weakness.  All other systems reviewed and are negative.  Past Medical History:  Diagnosis Date  . Allergy    allergic rhinitis  . Arthritis   . B12 deficiency 04/10/2019  . History of chicken pox   . Hyperlipidemia    no meds now   Past Surgical History:  Procedure Laterality Date  . COLONOSCOPY    . LAMINECTOMY N/A 05/13/2019   Procedure: THORACIC TEN LAMINECTOMY FOR TUMOR;  Surgeon: Earnie Larsson, MD;  Location: [facility];  Service: Neurosurgery;  Laterality: N/A;  . THYROID SURGERY     "Years ago"   Family History  Problem Relation Age of Onset  . Heart disease Father        patient is unsure of history  . CVA Father   . Heart attack Neg Hx   . Colon cancer Neg Hx   . Esophageal cancer Neg Hx   . Rectal cancer Neg Hx   . Stomach cancer Neg Hx    Social History:  reports that  he has been smoking. He has never used smokeless tobacco. He reports current alcohol use of about 7.0 standard drinks of alcohol per week. He reports that he does not use drugs. Allergies: No Known Allergies Medications Prior to Admission  Medication Sig Dispense Refill  . Multiple Vitamin (MULTIVITAMIN) tablet Take 1 tablet by mouth daily.      .  valACYclovir (VALTREX) 500 MG tablet Take 1 tablet (500 mg total) by mouth daily. (Patient taking differently: Take 500 mg by mouth daily as needed (outbreaks). ) 30 tablet 5    Drug Regimen Review Drug regimen was reviewed and remains appropriate with no significant issues identified  Home: Home Living Family/patient expects to be discharged to:: Private residence Living Arrangements: Alone Type of Home: House Home Access: Stairs to enter [employer] of Steps: "couple" Entrance Stairs-Rails: Right Home Layout: One level Bathroom Shower/Tub: Chiropodist: Standard Home Equipment: Cane - single point   Functional History: Prior Function Level of Independence: Independent, Independent with assistive device(s) Comments: walks with cane, multiple recent falls  Functional Status:  Mobility: Bed Mobility Overal bed mobility: Needs Assistance Bed Mobility: Rolling, Sidelying to Sit Rolling: Min assist Sidelying to sit: Min assist, HOB elevated General bed mobility comments: OOB in bathroom upon arrival Transfers Overall transfer level: Needs assistance Equipment used: Rolling walker (2 wheeled) Transfers: Sit to/from Stand Sit to Stand: Min assist General transfer comment: boosting assist from toilet, assist for balance, cues for hand placement Ambulation/Gait Ambulation/Gait assistance: Min assist Gait Distance (Feet): 100 Feet(x 2) Assistive device: Rolling walker (2 wheeled) Gait Pattern/deviations: Ataxic, Decreased dorsiflexion - right, Decreased stride length, Scissoring, Step-through pattern General Gait Details: heavy reliance on UE support, min A for balance/safety with episodes of scissoring, cues for posture    ADL: ADL Overall ADL's : Needs assistance/impaired Eating/Feeding: Modified independent, Sitting Grooming: Minimal assistance, Standing, Wash/dry face, Oral care Grooming Details (indicate cue type and reason): assist for  standing balance Upper Body Bathing: Set up, Min guard, Sitting Lower Body Bathing: Minimal assistance, Sit to/from stand Upper Body Dressing : Set up, Min guard, Sitting Lower Body Dressing: Minimal assistance, Moderate assistance, Sit to/from stand Toilet Transfer: Minimal assistance, Ambulation, RW, Cueing for sequencing, Cueing for safety Toileting- Clothing Manipulation and Hygiene: Minimal assistance, Sit to/from stand Functional mobility during ADLs: Minimal assistance, Rolling walker General ADL Comments: cues for sequencing RW use with mobility tasks  Cognition: Cognition Overall Cognitive Status: Within Functional Limits for tasks assessed Orientation Level: Oriented X4 Cognition Arousal/Alertness: Awake/alert Behavior During Therapy: WFL for tasks assessed/performed Overall Cognitive Status: Within Functional Limits for tasks assessed  Physical Exam: Blood pressure 111/66, pulse 71, temperature 98.6 F (37 C), temperature source Oral, resp. rate 15, height 6\' 1"  (1.854 m), weight 72.1 kg, SpO2 98 %.  Physical Exam   General: Alert and oriented x 3, No apparent distress HEENT: Head is normocephalic, atraumatic, PERRLA, EOMI, sclera anicteric, oral mucosa pink and moist, dentition intact, ext ear canals clear,  Neck: Large thyroid mass Heart: Reg rate and rhythm. No murmurs rubs or gallops Chest: CTA bilaterally without wheezes, rales, or rhonchi; no distress Abdomen: Soft, non-tender, non-distended, bowel sounds positive. Extremities: No clubbing, cyanosis, or edema. Pulses are 2+ Skin: Incision covered with honeycomb dressing and steri-strips; C/D/I Neuro: Pt is cognitively appropriate with normal insight, memory, and awareness. Cranial nerves 2-12 are intact. Sensory exam is normal. Reflexes are 2+ in all 4's. Fine motor coordination is intact. No tremors. Motor function is grossly 4+/5.  Musculoskeletal: Full ROM, No pain with AROM or PROM in the neck, trunk, or  extremities. Posture appropriate Psych: Pt's affect is appropriate. Pt is cooperative  Results for orders placed or performed during the hospital encounter of 05/12/19 (from the past 48 hour(s))  Prepare RBC     Status: None   Collection Time: 05/13/19 11:47 AM  Result Value Ref Range   Order Confirmation      ORDER PROCESSED BY BLOOD BANK Performed at [facility], [place] [address], [place], [place] [address]   Fibrinogen (coagulopathy lab panel)     Status: Abnormal   Collection Time: 05/13/19  3:20 PM  Result Value Ref Range   Fibrinogen 113 (L) 210 - 475 mg/dL    Comment: Performed at [facility] [address], [place], [place] [address]  Protime-INR (coagulopathy lab panel)     Status: Abnormal   Collection Time: 05/13/19  3:20 PM  Result Value Ref Range   Prothrombin Time 18.3 (H) 11.4 - 15.2 seconds   INR 1.5 (H) 0.8 - 1.2    Comment: (NOTE) INR goal varies based on device and disease states. Performed at [facility], [place] [address] [place], [place] [address]   APTT (coagulopathy lab panel)     Status: None   Collection Time: 05/13/19  3:20 PM  Result Value Ref Range   aPTT 25 24 - 36 seconds    Comment: Performed at [facility] [address] [place], [place] [address]  I-STAT, [place] [address]     Status: Abnormal   Collection Time: 05/13/19  3:28 PM  Result Value Ref Range   Sodium 140 135 - 145 mmol/L   Potassium 4.0 3.5 - 5.1 mmol/L   Chloride 103 98 - 111 mmol/L   BUN 15 8 - 23 mg/dL   Creatinine, Ser 0.80 0.61 - 1.24 mg/dL   Glucose, Bld 186 (H) 70 - 99 mg/dL    Comment: Glucose reference range applies only to samples taken after fasting for at least 8 hours.   Calcium, Ion 1.21 1.15 - 1.40 mmol/L   TCO2 24 22 - 32 mmol/L   Hemoglobin 9.9 (L) 13.0 - 17.0 g/dL   HCT 29.0 (L) 39.0 - 52.0 %  Hemoglobin and hematocrit, blood     Status: Abnormal   Collection Time: 05/13/19  4:18 PM  Result Value Ref Range   Hemoglobin 9.8 (L) 13.0 - 17.0  g/dL   HCT 30.3 (L) 39.0 - 52.0 %    Comment: Performed at [facility] [address] [place], [place] [address]  Platelet count     Status: None   Collection Time: 05/13/19  4:18 PM  Result Value Ref Range   Platelets 306 150 - 400 K/uL    Comment: Performed at [facility], [place] [address], [place], [place] [address]  CBC with Differential/Platelet     Status: Abnormal   Collection Time: 05/13/19  6:45 PM  Result Value Ref Range   WBC 16.6 (H) 4.0 - 10.5 K/uL   RBC 3.25 (L) 4.22 - 5.81 MIL/uL   Hemoglobin 9.6 (L) 13.0 - 17.0 g/dL   HCT 29.2 (L) 39.0 - 52.0 %   MCV 89.8 80.0 - 100.0 fL   MCH 29.5 26.0 - 34.0 pg   MCHC 32.9 30.0 - 36.0 g/dL   RDW 13.7 11.5 - 15.5 %   Platelets 300 150 - 400 K/uL   nRBC 0.0  0.0 - 0.2 %   Neutrophils Relative % 90 %   Neutro Abs 15.0 (H) 1.7 - 7.7 K/uL   Lymphocytes Relative 6 %   Lymphs Abs 0.9 0.7 - 4.0 K/uL   Monocytes Relative 3 %   Monocytes Absolute 0.4 0.1 - 1.0 K/uL   Eosinophils Relative 0 %   Eosinophils Absolute 0.0 0.0 - 0.5 K/uL   Basophils Relative 0 %   Basophils Absolute 0.0 0.0 - 0.1 K/uL   Immature Granulocytes 1 %   Abs Immature Granulocytes 0.22 (H) 0.00 - 0.07 K/uL    Comment: Performed at [facility] [address] [place], [place] [address]  Hemoglobin and hematocrit, blood     Status: Abnormal   Collection Time: 05/14/19  4:49 AM  Result Value Ref Range   Hemoglobin 8.0 (L) 13.0 - 17.0 g/dL   HCT 24.2 (L) 39.0 - 52.0 %    Comment: Performed at [facility] [address] [place], [place] [id number]  Basic metabolic panel     Status: Abnormal   Collection Time: 05/14/19  4:49 AM  Result Value Ref Range   Sodium 137 135 - 145 mmol/L   Potassium 4.3 3.5 - 5.1 mmol/L   Chloride 108 98 - 111 mmol/L   CO2 21 (L) 22 - 32 mmol/L   Glucose, Bld 158 (H) 70 - 99 mg/dL    Comment: Glucose reference range applies only to samples taken after fasting for at least 8 hours.   BUN 13 8 - 23 mg/dL    Creatinine, Ser 0.93 0.61 - 1.24 mg/dL   Calcium 7.9 (L) 8.9 - 10.3 mg/dL   GFR calc non Af Amer >60 >60 mL/min   GFR calc Af Amer >60 >60 mL/min   Anion gap 8 5 - 15    Comment: Performed at [facility] [address], [place], [place] [address]  Hemoglobin and hematocrit, blood     Status: Abnormal   Collection Time: 05/14/19 11:30 AM  Result Value Ref Range   Hemoglobin 8.0 (L) 13.0 - 17.0 g/dL   HCT 24.4 (L) 39.0 - 52.0 %    Comment: Performed at [facility], [place] [address] [place],  [address]   DG THORACOLUMABAR SPINE  Result Date: 05/13/2019 CLINICAL DATA:  T10 laminectomy. EXAM: THORACOLUMBAR SPINE 1V COMPARISON:  May 12, 2019. FINDINGS: Two intraoperative cross-table lateral projections were obtained of the lumbar spine. The first image demonstrates surgical probe directed toward T12 level. The second image demonstrates surgical retractor posterior to T10-11 disc space. IMPRESSION: Surgical localization as described above. Electronically Signed   By: Marijo Conception M.D.   On: 05/13/2019 16:25       Medical Problem List and Plan: 1.  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  Oncology.Radiation Oncology services follow-up  -patient may shower  -ELOS/Goals: modI 7-8 days 2.  Antithrombotics: -DVT/anticoagulation. CT 3/2 showed multiple small pulmonary nodules scattered in lungs bilaterally and multiple segmental and subsegmental filling defect in pulmonary arteries compatible with PE. : Plan is to begin Xarelto 05/23/2019 and was discussed with neurosurgery Dr.POOL  -antiplatelet therapy: N/A 3. Pain Management: Hydrocodone as needed as well as Valium as needed for muscle spasms. Well controlled.  4. Mood: Provide emotional support  -antipsychotic agents: N/A 5. Neuropsych: This patient is capable of making decisions on his own behalf. 6. Skin/Wound  Care:  Routine skin checks 7. Fluids/Electrolytes/Nutrition: Routine in and outs with follow-up chemistries 8.  Acute blood loss anemia.  Follow-up CBC. 9.  Constipation.  Laxative assistance 10.  Tobacco abuse.  Counseling  Lavon Paganini Angiulli, PA-C 05/15/2019   I have personally performed a face to face diagnostic evaluation, including, but not limited to relevant history and physical exam findings, of this patient and developed relevant assessment and plan.  Additionally, I have reviewed and concur with the physician assistant's documentation above.  Leeroy Cha, MD

## 2019-05-15 NOTE — Consult Note (Signed)
Radiation Oncology         [phone] ________________________________  Name: Tyrone Washington        MRN: [id number]  Date of Service: 05/14/19  DOB: Sep 06, 1955  CC:O'Sullivan, Lenna Sciara, NP    REFERRING PHYSICIAN: Dr. Annette Stable  DIAGNOSIS: The primary encounter diagnosis was Bilateral leg numbness. Diagnoses of Malignant neoplasm metastatic to bone [facility]) and Elective surgery were also pertinent to this visit.   HISTORY OF PRESENT ILLNESS: Tyrone Washington is a 64 y.o. male seen at the request of Dr. Annette Stable for a newly noted thoracic spine cord compression.  The patient has a history of a nodular goiter dating back about 10 years ago.  He has had multiple assessments of this and apparently had a hemithyroidectomy in 2010.  The patient shared his operative note and pathology report with Dr. Hendricks Limes when he was seen earlier this past month and it appeared he had had a hemithyroidectomy and frozen section was negative for malignancy but showed a follicular lesion.  He apparently had a complicated postoperative recovery including hematoma hematoma that had to be evacuated. 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  It sounds as though the patient had been evaluated on several occasions but has not had routine follow-up care.  He had slowly started noticing changes in the growth of this slowly over time and had been wanting to have procedures for orthopedic issues but he was sent to ENT for evaluation and was seen on 04/25/19 by Dr. Hendricks Limes to discuss thyroidectomy. He had a CT neck on 05/07/19 that revealed the right lobe of the thyroid measuring 4.8 x 6.8 x 7.1 cm, and enhancing thyroid tissue in the right paratracheal region, and the left lobe measuring 5.7 x 7.8 x 8.7 cm. No adenopathy in the neck was noted. In the midst of getting set up for surgical resection, he had also been experiencing progressive weakness from the waist down.   Apparently the symptoms started in October of last year, he has had multiple falls and has been still trying to work in the midst of this.  He was being referred to neurology by his primary provider for assessment of this, upon evaluation on 05/12/19, Dr. Posey Pronto, the neurologist seeing him was concerned that he may have symptoms of spinal cord compression.  He was sent urgently to the emergency room and underwent MRI of the thoracic lumbar and cervical spine.  No abnormal findings suggesting malignancy were noted in the cervical spine. In the thoracic spine however he had hyperintensity and enhancement at T7 extending to the posterior elements on the left with additional involvement of the inferior left aspect of the T6 vertebral body, extraosseous extension at T7 including the ventral and left lateral spinal canal effacement of the left lateral subarachnoid space without cord compression and effacement of the left T7-T8 foramen and partial effacement at T6-T7 foramen.  There was hyperintensity and enhancement at T10 extending into the left greater than right elements posteriorly involving the left 10th rib and involving the left aspect of T9 and posterior elements with significant extraosseous extension inferior at T9 and T10 including left ventral lateral and dorsal epidural extension and cord displacement with compression with likely abnormal cord signal.  Further distal imaging of the lumbar spine revealed a 13 mm lesion in the posterior inferior aspect of the S1 sacral segment with T1 hypointensity and avid enhancement as well as a 15 mm  lesion in the right sacral alla, mild degenerative endplate irregularities from L5-S1, and no evidence of abnormality in the distal aspect of his spinal cord.  Given the symptoms he was having which sound to have been progressive weakness, numbness, difficulty ambulating with leg stiffness and ataxia, he was taken urgently to the operating room for spinal cord decompression  including thoracic level 10 laminectomy on 05/13/2019.  Final pathology is pending at this time.  We are asked to contact the patient to discuss recommendations for postoperative radiotherapy.  He has met with Dr. Alen Blew, who has recommended systemic therapy once the primary diagnosis of malignancy is confirmed.    PREVIOUS RADIATION THERAPY: No   PAST MEDICAL HISTORY:  Past Medical History:  Diagnosis Date  . Allergy    allergic rhinitis  . Arthritis   . B12 deficiency 04/10/2019  . History of chicken pox   . Hyperlipidemia    no meds now       PAST SURGICAL HISTORY: Past Surgical History:  Procedure Laterality Date  . COLONOSCOPY    . THYROID SURGERY     "Years ago"     FAMILY HISTORY:  Family History  Problem Relation Age of Onset  . Heart disease Father        patient is unsure of history  . CVA Father   . Heart attack Neg Hx   . Colon cancer Neg Hx   . Esophageal cancer Neg Hx   . Rectal cancer Neg Hx   . Stomach cancer Neg Hx      SOCIAL HISTORY:  reports that he has been smoking. He has never used smokeless tobacco. He reports current alcohol use of about 7.0 standard drinks of alcohol per week. He reports that he does not use drugs.   ALLERGIES: Patient has no known allergies.   MEDICATIONS:  Current Facility-Administered Medications  Medication Dose Route Frequency Provider Last Rate Last Admin  . 0.9 %  sodium chloride infusion  250 mL Intravenous PRN Earnie Larsson, MD      . 0.9 %  sodium chloride infusion  250 mL Intravenous Continuous Pool, Mallie Mussel, MD      . 0.9 %  sodium chloride infusion   Intravenous Continuous Ashok Pall, MD 100 mL/hr at 05/14/19 1508 New Bag at 05/14/19 1508  . acetaminophen (TYLENOL) tablet 650 mg  650 mg Oral Q6H PRN Earnie Larsson, MD       Or  . acetaminophen (TYLENOL) suppository 650 mg  650 mg Rectal Q6H PRN Earnie Larsson, MD      . bisacodyl (DULCOLAX) suppository 10 mg  10 mg Rectal Daily PRN Earnie Larsson, MD      .  Chlorhexidine Gluconate Cloth 2 % PADS 6 each  6 each Topical Daily Earnie Larsson, MD   6 each at 05/14/19 0848  . dexamethasone (DECADRON) injection 10 mg  10 mg Intravenous Q6H Earnie Larsson, MD   10 mg at 05/14/19 1234  . diazepam (VALIUM) tablet 5-10 mg  5-10 mg Oral Q6H PRN Earnie Larsson, MD      . HYDROcodone-acetaminophen (NORCO/VICODIN) 5-325 MG per tablet 1-2 tablet  1-2 tablet Oral Q4H PRN Earnie Larsson, MD      . HYDROmorphone (DILAUDID) injection 1 mg  1 mg Intravenous Q2H PRN Earnie Larsson, MD      . iohexol (OMNIPAQUE) 350 MG/ML injection 75 mL  75 mL Intravenous Once PRN Earnie Larsson, MD      . menthol-cetylpyridinium (CEPACOL) lozenge 3 mg  1  lozenge Oral PRN Earnie Larsson, MD       Or  . phenol (CHLORASEPTIC) mouth spray 1 spray  1 spray Mouth/Throat PRN Earnie Larsson, MD      . ondansetron [facility]) tablet 4 mg  4 mg Oral Q6H PRN Earnie Larsson, MD       Or  . ondansetron (ZOFRAN) injection 4 mg  4 mg Intravenous Q6H PRN Earnie Larsson, MD      . oxyCODONE (Oxy IR/ROXICODONE) immediate release tablet 10 mg  10 mg Oral Q3H PRN Earnie Larsson, MD      . phenylephrine (NEOSYNEPHRINE) 10-0.9 MG/250ML-% infusion  0-400 mcg/min Intravenous Titrated Albertha Ghee, MD   Stopped at 05/14/19 1510  . polyethylene glycol (MIRALAX / GLYCOLAX) packet 17 g  17 g Oral Daily PRN Earnie Larsson, MD      . sodium chloride flush (NS) 0.9 % injection 3 mL  3 mL Intravenous Q12H Earnie Larsson, MD   3 mL at 05/14/19 0828  . sodium chloride flush (NS) 0.9 % injection 3 mL  3 mL Intravenous PRN Earnie Larsson, MD      . sodium chloride flush (NS) 0.9 % injection 3 mL  3 mL Intravenous Q12H Earnie Larsson, MD   3 mL at 05/14/19 0828  . sodium chloride flush (NS) 0.9 % injection 3 mL  3 mL Intravenous PRN Earnie Larsson, MD      . sodium phosphate (FLEET) 7-19 GM/118ML enema 1 enema  1 enema Rectal Once PRN Earnie Larsson, MD         REVIEW OF SYSTEMS: On review of systems the patient reports that he is doing quite well overall.  He states  that he is able to feel his feet now and has been able to wiggle his toes and actually able to move his legs much more so than prior to surgery.  He states that he is anticipating rehabilitation prior to hospital discharge.  He is also interested in talking with social work to determine his options for disability.  He denies any difficulty with bowel or bladder function, shortness of breath or chest pain.  He denies any significant trouble swallowing regarding his thyroid hypertrophy.  No other complaints are verbalized    PHYSICAL EXAM:  Wt Readings from Last 3 Encounters:  05/13/19 158 lb 15.2 oz (72.1 kg)  05/12/19 160 lb (72.6 kg)  04/08/19 161 lb (73 kg)   Temp Readings from Last 3 Encounters:  05/14/19 99 F (37.2 C) (Oral)  04/08/19 (!) 96.5 F (35.8 C) (Oral)  03/25/19 (!) 97.5 F (36.4 C) (Temporal)   BP Readings from Last 3 Encounters:  05/14/19 102/69  05/12/19 103/68  04/08/19 108/71   Pulse Readings from Last 3 Encounters:  05/14/19 85  05/12/19 84  04/08/19 84   Pain Assessment Pain Score: 0-No pain/10  Unable to examine due to encounter type.  ECOG = 2  0 - Asymptomatic (Fully active, able to carry on all predisease activities without restriction)  1 - Symptomatic but completely ambulatory (Restricted in physically strenuous activity but ambulatory and able to carry out work of a light or sedentary nature. For example, light housework, office work)  2 - Symptomatic, <50% in bed during the day (Ambulatory and capable of all self care but unable to carry out any work activities. Up and about more than 50% of waking hours)  3 - Symptomatic, >50% in bed, but not bedbound (Capable of only limited self-care, confined to bed or chair 50%  or more of waking hours)  4 - Bedbound (Completely disabled. Cannot carry on any self-care. Totally confined to bed or chair)  5 - Death   Eustace Pen MM, Creech RH, Tormey DC, et al. [phone]). "Toxicity and response criteria of the  [facility]". [place] Oncol. 5 (6): 649-55    LABORATORY DATA:  Lab Results  Component Value Date   WBC 16.6 (H) 05/13/2019   HGB 8.0 (L) 05/14/2019   HCT 24.4 (L) 05/14/2019   MCV 89.8 05/13/2019   PLT 300 05/13/2019   Lab Results  Component Value Date   NA 137 05/14/2019   K 4.3 05/14/2019   CL 108 05/14/2019   CO2 21 (L) 05/14/2019   Lab Results  Component Value Date   ALT 12 05/12/2019   AST 19 05/12/2019   ALKPHOS 55 05/12/2019   BILITOT 0.5 05/12/2019      RADIOGRAPHY: DG THORACOLUMABAR SPINE  Result Date: 05/13/2019 CLINICAL DATA:  T10 laminectomy. EXAM: THORACOLUMBAR SPINE 1V COMPARISON:  May 12, 2019. FINDINGS: Two intraoperative cross-table lateral projections were obtained of the lumbar spine. The first image demonstrates surgical probe directed toward T12 level. The second image demonstrates surgical retractor posterior to T10-11 disc space. IMPRESSION: Surgical localization as described above. Electronically Signed   By: Marijo Conception M.D.   On: 05/13/2019 16:25   CT CHEST W CONTRAST  Result Date: 05/13/2019 CLINICAL DATA:  64 year old male with history of cancer of unknown primary. EXAM: CT CHEST, ABDOMEN, AND PELVIS WITH CONTRAST TECHNIQUE: Multidetector CT imaging of the chest, abdomen and pelvis was performed following the standard protocol during bolus administration of intravenous contrast. CONTRAST:  186m OMNIPAQUE IOHEXOL 300 MG/ML  SOLN COMPARISON:  No priors. FINDINGS: CT CHEST FINDINGS Cardiovascular: Heart size is normal. There is no significant pericardial fluid, thickening or pericardial calcification. No atherosclerotic calcifications in the thoracic aorta or the coronary arteries. Filling defects are present within segmental and subsegmental sized pulmonary artery branches in the lungs bilaterally, indicative of pulmonary embolism. The largest of these is in the right lower lobe (axial image 39 of series 3). At this time,  these appear to be nonocclusive. Mediastinum/Nodes: Large heterogeneously enhancing thyroid mass incompletely imaged at the level of the thoracic inlet measuring at least 11.8 x 9.8 cm (axial image 1 of series 3). No pathologically enlarged mediastinal or hilar lymph nodes. Esophagus is unremarkable in appearance. No axillary lymphadenopathy. Lungs/Pleura: Multiple small pulmonary nodules are scattered throughout the lungs bilaterally, highly concerning for widespread metastatic disease, largest of which measure up to 9 mm in the left lower lobe (axial image 122 of series 5, and 11 mm in the right lower lobe (axial image 121 of series 5). Small right pleural effusion which is partially loculated laterally, but predominantly layers dependently. No left pleural effusion. No acute consolidative airspace disease. Musculoskeletal: Lytic lesions are noted in the thoracic spine involving both T7 and T10. The largest lesion involves the posterior aspect of the T10 vertebral body, extending through the pedicle into the lateral and posterior elements, as well as involving the adjacent head and neck of the left tenth rib, measuring 5.1 x 3.8 cm (axial image 102 of series 5), encroaching upon and significantly narrowing the central spinal canal. The smaller lesion is at T7 measuring approximately 3.9 x 2.7 cm also involving the left side of the vertebral body extending into the pedicle and left transverse process, as well as the left paraspinal soft tissues with minimal encroachment upon  the central spinal canal. Both of these lesions demonstrate extensive enhancement. CT ABDOMEN PELVIS FINDINGS Hepatobiliary: No suspicious cystic or solid hepatic lesions. No intra or extrahepatic biliary ductal dilatation. Gallbladder is normal in appearance. Pancreas: No pancreatic mass. No pancreatic ductal dilatation. No pancreatic or peripancreatic fluid collections or inflammatory changes. Spleen: Unremarkable. Adrenals/Urinary Tract:  Subcentimeter low-attenuation lesion in the interpolar region of the right kidney, too small to characterize, but statistically likely to represent a tiny cyst. Left kidney and bilateral adrenal glands are normal in appearance. No hydroureteronephrosis. Urinary bladder is normal in appearance. Stomach/Bowel: Normal appearance of the stomach. No pathologic dilatation of small bowel or colon. Normal appendix. Vascular/Lymphatic: Atherosclerotic calcifications in the pelvic vasculature. No aneurysm or dissection noted in the abdominal or pelvic vasculature. No lymphadenopathy noted in the abdomen or pelvis. Reproductive: Prostate gland and seminal vesicles are unremarkable. Other: No significant volume of ascites.  No pneumoperitoneum. Musculoskeletal: There are no aggressive appearing lytic or blastic lesions noted in the visualized portions of the skeleton. IMPRESSION: 1. Widespread metastatic disease to the lungs, small right pleural effusion which is partially loculated and may be malignant, and metastatic lesions in the thoracic spine, as detailed above. A primary source is uncertain, however, the lesions of the thoracic spine demonstrate avid enhancement, which is very similar to the enhancement characteristics of the large thyroid mass. 2. Multiple segmental and subsegmental sized filling defects in the pulmonary arteries bilaterally, compatible with pulmonary embolism. 3. Large heterogeneously enhancing thyroid mass incompletely imaged. Although this may simply represent a thyroid goiter, the possibility of primary thyroid neoplasm should be considered, particularly in light of the similar enhancement characteristics of the spinal lesions (discussed above). 4. Additional incidental findings, as above. Electronically Signed   By: Vinnie Langton M.D.   On: 05/13/2019 08:58   MR Cervical Spine W or Wo Contrast  Result Date: 05/12/2019 CLINICAL DATA:  Bilateral lower extremity weakness, abnormal lumbar spine  EXAM: MRI CERVICAL SPINE WITHOUT AND WITH CONTRAST TECHNIQUE: Multiplanar and multiecho pulse sequences of the cervical spine, to include the craniocervical junction and cervicothoracic junction, were obtained without and with intravenous contrast. CONTRAST:  30m GADAVIST GADOBUTROL 1 MMOL/ML IV SOLN COMPARISON:  None. FINDINGS: Alignment: Anteroposterior alignment is maintained. Vertebrae: Vertebral body heights are preserved. There is no substantial marrow edema or suspicious osseous lesion. Cord: No abnormal signal. Posterior Fossa, vertebral arteries, paraspinal tissues: Markedly enlarged and heterogeneous thyroid is incompletely evaluated. Disc levels: Minor disc bulges are present. Mild facet and uncovertebral hypertrophy. There is no high-grade degenerative stenosis. IMPRESSION: No evidence cervical spine metastatic disease. Electronically Signed   By: PMacy MisM.D.   On: 05/12/2019 21:50   MR THORACIC SPINE W WO CONTRAST  Result Date: 05/12/2019 CLINICAL DATA:  Lower extremity weakness and numbness, abnormal lumbar spine EXAM: MRI THORACIC WITHOUT AND WITH CONTRAST TECHNIQUE: Multiplanar and multiecho pulse sequences of the thoracic spine were obtained without and with intravenous contrast. CONTRAST:  740mGADAVIST GADOBUTROL 1 MMOL/ML IV SOLN COMPARISON:  None. FINDINGS: Motion artifact is present. Alignment: Thoracic kyphosis is preserved. No substantial anteroposterior listhesis. Vertebrae: There is abnormal STIR hyperintensity and enhancement at the T7 level extending into the posterior elements on the left. Additional involvement of the inferior left aspect of the T6 vertebral body. There is extraosseous extension at T7 including the ventral and left lateral spinal canal. Effacement of the left lateral subarachnoid space without cord compression. There is also effacement of the left T7-T8 foramen and partial effacement of the  left T6-T7 foramen. Abnormal STIR hyperintensity and enhancement at  the T10 level extending into the left much greater than right posterior elements with involvement of the left tenth rib. Additional involvement of the left aspect of the T9 vertebral body and posterior elements. Significant extraosseous extension at inferior T9 and T10 including left ventral, lateral, and dorsal epidural extension with cord displacement and compression. There is likely abnormal cord signal. Effacement of the left T9-T10 and T10-T11 foramina. Cord:  As above.  Otherwise unremarkable. Paraspinal and other soft tissues: Partially imaged significantly enlarged and heterogeneous thyroid is incompletely evaluated. Disc levels: Mild degenerative disc disease and facet arthropathy. There is no high-grade degenerative stenosis. IMPRESSION: Abnormal signal at T7 and T10 with additional less pronounced adjacent involvement at T6 and T9 consistent with metastatic disease. Extraosseous extension is present at both levels as detailed above. Most notably, there is severe canal stenosis with cord compression and abnormal cord signal at the inferior T9 and T10 level. These results were called by telephone at the time of interpretation on 05/12/2019 at 9:43 pm to provider Madilyn Hook, who verbally acknowledged these results. Electronically Signed   By: Macy Mis M.D.   On: 05/12/2019 21:44   MR Lumbar Spine W Wo Contrast  Result Date: 05/12/2019 CLINICAL DATA:  Lumbar radiculopathy, no red flags. Additional history provided: Numbness of the feet and legs which began October 2020. EXAM: MRI LUMBAR SPINE WITHOUT AND WITH CONTRAST TECHNIQUE: Multiplanar and multiecho pulse sequences of the lumbar spine were obtained without and with intravenous contrast. CONTRAST:  37m GADAVIST GADOBUTROL 1 MMOL/ML IV SOLN COMPARISON:  No pertinent prior studies available for comparison. FINDINGS: Segmentation: For the purposes of this dictation, five lumbar vertebrae are assumed and the caudal most well-formed intervertebral  disc is designated L5-S1. Alignment: Straightening of the expected lumbar lordosis. No significant spondylolisthesis. Vertebrae: Vertebral body height is maintained. There is a 13 mm lesion within the posteroinferior aspect of the S1 sacral segment which demonstrates T1 hypointensity, T2/STIR hyperintensity and avid enhancement (series 3, image 7) (series 7, image 7). Additional 15 mm enhancing lesion within the right sacral ala (series 6, image 35) (series 8, image 35). Mild L5-S1 degenerative endplate irregularity with mixed degenerative endplate marrow signal and a small Schmorl node within the S1 superior endplate. Conus medullaris and cauda equina: Conus extends to the L1-L2 level. No signal abnormality within the visualized distal spinal cord. No abnormal enhancement of the visualized distal spinal cord or cauda equina nerve roots. Paraspinal and other soft tissues: No abnormality identified within included portions of the abdomen/retroperitoneum. Paraspinal soft tissues within normal limits. Disc levels: Moderate L5-S1 disc degeneration. Mild-to-moderate disc degeneration is also present at L3-L4 and L4-L5. T12-L1: This level is not included on axial imaging. No disc herniation. No significant canal or foraminal stenosis. L1-L2: No disc herniation. No significant canal or foraminal stenosis. Small posteriorly projecting synovial facet cyst on the left. L2-L3: No disc herniation. No significant canal or foraminal stenosis. L3-L4: Disc bulge with endplate spurring. Mild facet arthrosis/ligamentum flavum hypertrophy. Mild relative spinal canal narrowing without nerve root impingement. Mild bilateral neural foraminal narrowing. L4-L5: Disc bulge with endplate spurring. Mild facet arthrosis/ligamentum flavum hypertrophy. Mild relative spinal canal narrowing without nerve root impingement. Mild bilateral neural foraminal narrowing. L5-S1: Disc bulge with endplate spurring. Mild facet arthrosis. No significant  spinal canal stenosis. Mild left neural foraminal narrowing. IMPRESSION: Two enhancing osseous lesions, one measuring 13 mm within the posterior inferior S1 sacral segment, and the other  measuring 15 mm within the right sacral ala. Findings are highly suspicious for osseous metastatic disease. Given the provided history of numbness from the waist down, consider contrast-enhanced MRI of the thoracic spine to assess for metastatic disease and cord compression at these levels. Lumbar spondylosis as detailed and greatest at L5-S1. No more than mild spinal canal or neural foraminal narrowing within the lumbar spine. Electronically Signed   By: Kellie Simmering DO   On: 05/12/2019 13:27   CT ABDOMEN PELVIS W CONTRAST  Result Date: 05/13/2019 CLINICAL DATA:  64 year old male with history of cancer of unknown primary. EXAM: CT CHEST, ABDOMEN, AND PELVIS WITH CONTRAST TECHNIQUE: Multidetector CT imaging of the chest, abdomen and pelvis was performed following the standard protocol during bolus administration of intravenous contrast. CONTRAST:  129m OMNIPAQUE IOHEXOL 300 MG/ML  SOLN COMPARISON:  No priors. FINDINGS: CT CHEST FINDINGS Cardiovascular: Heart size is normal. There is no significant pericardial fluid, thickening or pericardial calcification. No atherosclerotic calcifications in the thoracic aorta or the coronary arteries. Filling defects are present within segmental and subsegmental sized pulmonary artery branches in the lungs bilaterally, indicative of pulmonary embolism. The largest of these is in the right lower lobe (axial image 39 of series 3). At this time, these appear to be nonocclusive. Mediastinum/Nodes: Large heterogeneously enhancing thyroid mass incompletely imaged at the level of the thoracic inlet measuring at least 11.8 x 9.8 cm (axial image 1 of series 3). No pathologically enlarged mediastinal or hilar lymph nodes. Esophagus is unremarkable in appearance. No axillary lymphadenopathy.  Lungs/Pleura: Multiple small pulmonary nodules are scattered throughout the lungs bilaterally, highly concerning for widespread metastatic disease, largest of which measure up to 9 mm in the left lower lobe (axial image 122 of series 5, and 11 mm in the right lower lobe (axial image 121 of series 5). Small right pleural effusion which is partially loculated laterally, but predominantly layers dependently. No left pleural effusion. No acute consolidative airspace disease. Musculoskeletal: Lytic lesions are noted in the thoracic spine involving both T7 and T10. The largest lesion involves the posterior aspect of the T10 vertebral body, extending through the pedicle into the lateral and posterior elements, as well as involving the adjacent head and neck of the left tenth rib, measuring 5.1 x 3.8 cm (axial image 102 of series 5), encroaching upon and significantly narrowing the central spinal canal. The smaller lesion is at T7 measuring approximately 3.9 x 2.7 cm also involving the left side of the vertebral body extending into the pedicle and left transverse process, as well as the left paraspinal soft tissues with minimal encroachment upon the central spinal canal. Both of these lesions demonstrate extensive enhancement. CT ABDOMEN PELVIS FINDINGS Hepatobiliary: No suspicious cystic or solid hepatic lesions. No intra or extrahepatic biliary ductal dilatation. Gallbladder is normal in appearance. Pancreas: No pancreatic mass. No pancreatic ductal dilatation. No pancreatic or peripancreatic fluid collections or inflammatory changes. Spleen: Unremarkable. Adrenals/Urinary Tract: Subcentimeter low-attenuation lesion in the interpolar region of the right kidney, too small to characterize, but statistically likely to represent a tiny cyst. Left kidney and bilateral adrenal glands are normal in appearance. No hydroureteronephrosis. Urinary bladder is normal in appearance. Stomach/Bowel: Normal appearance of the stomach. No  pathologic dilatation of small bowel or colon. Normal appendix. Vascular/Lymphatic: Atherosclerotic calcifications in the pelvic vasculature. No aneurysm or dissection noted in the abdominal or pelvic vasculature. No lymphadenopathy noted in the abdomen or pelvis. Reproductive: Prostate gland and seminal vesicles are unremarkable. Other: No significant volume  of ascites.  No pneumoperitoneum. Musculoskeletal: There are no aggressive appearing lytic or blastic lesions noted in the visualized portions of the skeleton. IMPRESSION: 1. Widespread metastatic disease to the lungs, small right pleural effusion which is partially loculated and may be malignant, and metastatic lesions in the thoracic spine, as detailed above. A primary source is uncertain, however, the lesions of the thoracic spine demonstrate avid enhancement, which is very similar to the enhancement characteristics of the large thyroid mass. 2. Multiple segmental and subsegmental sized filling defects in the pulmonary arteries bilaterally, compatible with pulmonary embolism. 3. Large heterogeneously enhancing thyroid mass incompletely imaged. Although this may simply represent a thyroid goiter, the possibility of primary thyroid neoplasm should be considered, particularly in light of the similar enhancement characteristics of the spinal lesions (discussed above). 4. Additional incidental findings, as above. Electronically Signed   By: Vinnie Langton M.D.   On: 05/13/2019 08:58       IMPRESSION/PLAN: 1. 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  Dr. Lisbeth Renshaw has reviewed the findings from the patient's imaging, there appear to be 3 areas in total affected by this process which we suspect to be malignant.  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  We will follow-up with the results of pathology  when available.  Unfortunately due to the multifocal sites of his disease he would not be a good candidate for stereotactic radiotherapy however he would be a good candidate for palliative radiotherapy.  With the location of his disease, it would be very difficult to plan to separate areas of treatment in the thoracic spine to Dr. Lisbeth Renshaw would offer a palliative course to levels T6-T11 as well as to the sacrum covering S1 and the sacral ala.  We would anticipate proceeding in this manner following a few weeks of postoperative recovery to ensure that he is fully healed at the skin.  If he were to develop progressive neurologic changes however this could be reconsidered as far as timing.  He and I spoke about the options of radiotherapy, the logistics logistics, delivery and course of treatment.  We would anticipate a 3-week course of treatment due to the plan for ongoing systemic therapy with Dr. Alen Blew once his primary is determined.  We discussed the risks, benefits, short and long-term effects of therapy and the patient is interested in proceeding at the appropriate interval.  We will coordinate simulation and anticipate this to occur the week of 05/26/2019.  In a visit lasting 75 minutes, greater than 50% of the time was spent by phone and discussing the patient's condition, in preparation for the discussion, and coordinating the patient's care.    Carola Rhine, PAC

## 2019-05-15 NOTE — Progress Notes (Signed)
Physical Therapy Treatment Patient Details Name: Tyrone Washington MRN: [id number] DOB: 14-Mar-1955 Today's Date: 05/15/2019    History of Present Illness Patient is a 64 y/o male admitted with falling, numbness in LE's.  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  Now s/p T9-10 laminectomy for epidural tumor and Left T9 transpedicular resection of epidural tumor, microdissection.  CT scan 05/13/19 showed multiple small pulmonary nodules scattered in the lung bilaterally and Multiple segmental and subsegmental filling defect in the pulmonary arteries compatible with pulmonary embolism.    PT Comments    Patient progressing this session with improved even stride lengths, but still with some R scissoring, improves some with cues.  Note head and neck flexion during ambulation for visual feedback about steps/gait.  Patient with HR max 129 with mobility/balance/coordination activities.  Feel he remains appropriate for CIR level rehab upon d/c.   Follow Up Recommendations  CIR     Equipment Recommendations  Rolling walker with 5" wheels;3in1 (PT)    Recommendations for Other Services       Precautions / Restrictions Precautions Precautions: Fall Precaution Comments: multiple falls at home    Mobility  Bed Mobility Overal bed mobility: Needs Assistance Bed Mobility: Rolling;Sidelying to Sit Rolling: Min assist Sidelying to sit: Min assist       General bed mobility comments: initially trying to pull up to sit with PT help; reminded about back precautions and assisted to roll and perform side to sit  Transfers Overall transfer level: Needs assistance Equipment used: Rolling walker (2 wheeled) Transfers: Sit to/from Stand Sit to Stand: Min assist         General transfer comment: pulls up on foot board on bed, assist for balance  Ambulation/Gait Ambulation/Gait assistance: Min assist Gait Distance (Feet): 150 Feet(x 2) Assistive  device: Rolling walker (2 wheeled) Gait Pattern/deviations: Step-through pattern;Decreased stride length;Narrow base of support;Ataxic     General Gait Details: assist for balance, cues for step width, noted more even strides today   Stairs             Wheelchair Mobility    Modified Rankin (Stroke Patients Only)       Balance                               High Level Balance Comments: side stepping at rail in hallway; stepping sideways and forward over and back sequencing feet for balance/coordination; bilat UE support for all activities            Cognition Arousal/Alertness: Awake/alert Behavior During Therapy: WFL for tasks assessed/performed Overall Cognitive Status: Within Functional Limits for tasks assessed                                        Exercises      General Comments        Pertinent Vitals/Pain Faces Pain Scale: Hurts a little bit Pain Location: feet Pain Descriptors / Indicators: Tingling;Numbness Pain Intervention(s): Monitored during session;Repositioned    Home Living                      Prior Function            PT Goals (current goals can now be found in the care plan section) Progress towards PT goals:  Progressing toward goals    Frequency    Min 5X/week      PT Plan Current plan remains appropriate    Co-evaluation              AM-PAC PT "6 Clicks" Mobility   Outcome Measure  Help needed turning from your back to your side while in a flat bed without using bedrails?: A Little Help needed moving from lying on your back to sitting on the side of a flat bed without using bedrails?: A Little Help needed moving to and from a bed to a chair (including a wheelchair)?: A Little Help needed standing up from a chair using your arms (e.g., wheelchair or bedside chair)?: A Little Help needed to walk in hospital room?: A Little Help needed climbing 3-5 steps with a railing? : A  Lot 6 Click Score: 17    End of Session Equipment Utilized During Treatment: Gait belt Activity Tolerance: Patient tolerated treatment well Patient left: in chair;with call bell/phone within reach Nurse Communication: Mobility status PT Visit Diagnosis: Other abnormalities of gait and mobility (R26.89);Repeated falls (R29.6);Muscle weakness (generalized) (M62.81)     Time: [id number] PT Time Calculation (min) (ACUTE ONLY): 25 min  Charges:  $Gait Training: 8-22 mins $Neuromuscular Re-education: 8-22 mins                     Tyrone Washington, [place] [phone] 05/15/2019    Tyrone Washington 05/15/2019, 2:57 PM

## 2019-05-15 NOTE — Progress Notes (Signed)
Inpatient Rehabilitation-Admissions Coordinator   Spoke with pt's sister via phone. She has confirmed intermittant Supervision at [facility]. Insurance authorization process initiated for possible admit.   Will follow up once there has been a determination.   Raechel Ache, OTR/L  Rehab Admissions Coordinator  [phone] 05/15/2019 2:21 PM

## 2019-05-15 NOTE — Anesthesia Postprocedure Evaluation (Signed)
Anesthesia Post Note  Patient: Tyrone Washington  Procedure(s) Performed: THORACIC TEN LAMINECTOMY FOR TUMOR (N/A Spine Thoracic)     Patient location during evaluation: PACU Anesthesia Type: General Level of consciousness: awake and alert Pain management: pain level controlled Vital Signs Assessment: post-procedure vital signs reviewed and stable Respiratory status: spontaneous breathing, nonlabored ventilation, respiratory function stable and patient connected to nasal cannula oxygen Cardiovascular status: blood pressure returned to baseline and stable Postop Assessment: no apparent nausea or vomiting Anesthetic complications: no    Last Vitals:  Vitals:   05/15/19 1855 05/15/19 2012  BP: 121/87 109/64  Pulse:  74  Resp:  19  Temp: 36.8 C 36.4 C  SpO2:  100%    Last Pain:  Vitals:   05/15/19 2012  TempSrc: Oral  PainSc:                  [place]

## 2019-05-16 MED ORDER — DIAZEPAM 5 MG PO TABS: 5.0000 mg | ORAL_TABLET | Freq: Four times a day (QID) | ORAL | 0 refills | Status: DC | PRN

## 2019-05-16 MED ORDER — POLYETHYLENE GLYCOL 3350 17 G PO PACK: 17.0000 g | PACK | Freq: Every day | ORAL | 0 refills | Status: DC | PRN

## 2019-05-16 MED ORDER — DEXAMETHASONE 6 MG PO TABS: 6.0000 mg | ORAL_TABLET | Freq: Four times a day (QID) | ORAL | Status: DC

## 2019-05-16 MED ORDER — HYDROCODONE-ACETAMINOPHEN 5-325 MG PO TABS: 1.0000 | ORAL_TABLET | ORAL | 0 refills | Status: DC | PRN

## 2019-05-16 MED ORDER — ACETAMINOPHEN 325 MG PO TABS: 650.0000 mg | ORAL_TABLET | Freq: Four times a day (QID) | ORAL | Status: DC | PRN

## 2019-05-16 NOTE — Discharge Summary (Signed)
Physician Discharge Summary  Patient ID: Tyrone Washington MRN: [id number] DOB/AGE: [birth year] 64 y.o.  Admit date: 05/12/2019 Discharge date: 05/16/2019  Admission Diagnoses:  Discharge Diagnoses:  Active Problems:   Metastasis of neoplasm to spinal canal [facility])   Discharged Condition: good  Hospital Course: Patient was admitted to [facility] following a T9-10 laminectomy for epidural resection and a left T9 transpedicular resection of epidural tumor on 05/13/2019. Imaging while in the hospital revealed a pulmonary embolism. Anticoagulation is being held until the patient is 10 days postop. He will be able to start anticoagulation on 05/23/2019. Otherwise, he has done well postoperatively. His pain is well-controlled. He has worked with therapies who are recommending inpatient rehabilitation.  Consults: hematology/oncology and rehabilitation medicine  Significant Diagnostic Studies: DG THORACOLUMABAR SPINE  Result Date: 05/13/2019 CLINICAL DATA:  T10 laminectomy. EXAM: THORACOLUMBAR SPINE 1V COMPARISON:  May 12, 2019. FINDINGS: Two intraoperative cross-table lateral projections were obtained of the lumbar spine. The first image demonstrates surgical probe directed toward T12 level. The second image demonstrates surgical retractor posterior to T10-11 disc space. IMPRESSION: Surgical localization as described above. Electronically Signed   By: Marijo Conception M.D.   On: 05/13/2019 16:25   CT CHEST W CONTRAST  Result Date: 05/13/2019 CLINICAL DATA:  64 year old male with history of cancer of unknown primary. EXAM: CT CHEST, ABDOMEN, AND PELVIS WITH CONTRAST TECHNIQUE: Multidetector CT imaging of the chest, abdomen and pelvis was performed following the standard protocol during bolus administration of intravenous contrast. CONTRAST:  16mL OMNIPAQUE IOHEXOL 300 MG/ML  SOLN COMPARISON:  No priors. FINDINGS: CT CHEST FINDINGS Cardiovascular: Heart size is normal. There is no significant pericardial fluid,  thickening or pericardial calcification. No atherosclerotic calcifications in the thoracic aorta or the coronary arteries. Filling defects are present within segmental and subsegmental sized pulmonary artery branches in the lungs bilaterally, indicative of pulmonary embolism. The largest of these is in the right lower lobe (axial image 39 of series 3). At this time, these appear to be nonocclusive. Mediastinum/Nodes: Large heterogeneously enhancing thyroid mass incompletely imaged at the level of the thoracic inlet measuring at least 11.8 x 9.8 cm (axial image 1 of series 3). No pathologically enlarged mediastinal or hilar lymph nodes. Esophagus is unremarkable in appearance. No axillary lymphadenopathy. Lungs/Pleura: Multiple small pulmonary nodules are scattered throughout the lungs bilaterally, highly concerning for widespread metastatic disease, largest of which measure up to 9 mm in the left lower lobe (axial image 122 of series 5, and 11 mm in the right lower lobe (axial image 121 of series 5). Small right pleural effusion which is partially loculated laterally, but predominantly layers dependently. No left pleural effusion. No acute consolidative airspace disease. Musculoskeletal: Lytic lesions are noted in the thoracic spine involving both T7 and T10. The largest lesion involves the posterior aspect of the T10 vertebral body, extending through the pedicle into the lateral and posterior elements, as well as involving the adjacent head and neck of the left tenth rib, measuring 5.1 x 3.8 cm (axial image 102 of series 5), encroaching upon and significantly narrowing the central spinal canal. The smaller lesion is at T7 measuring approximately 3.9 x 2.7 cm also involving the left side of the vertebral body extending into the pedicle and left transverse process, as well as the left paraspinal soft tissues with minimal encroachment upon the central spinal canal. Both of these lesions demonstrate extensive  enhancement. CT ABDOMEN PELVIS FINDINGS Hepatobiliary: No suspicious cystic or solid hepatic lesions. No intra or extrahepatic  biliary ductal dilatation. Gallbladder is normal in appearance. Pancreas: No pancreatic mass. No pancreatic ductal dilatation. No pancreatic or peripancreatic fluid collections or inflammatory changes. Spleen: Unremarkable. Adrenals/Urinary Tract: Subcentimeter low-attenuation lesion in the interpolar region of the right kidney, too small to characterize, but statistically likely to represent a tiny cyst. Left kidney and bilateral adrenal glands are normal in appearance. No hydroureteronephrosis. Urinary bladder is normal in appearance. Stomach/Bowel: Normal appearance of the stomach. No pathologic dilatation of small bowel or colon. Normal appendix. Vascular/Lymphatic: Atherosclerotic calcifications in the pelvic vasculature. No aneurysm or dissection noted in the abdominal or pelvic vasculature. No lymphadenopathy noted in the abdomen or pelvis. Reproductive: Prostate gland and seminal vesicles are unremarkable. Other: No significant volume of ascites.  No pneumoperitoneum. Musculoskeletal: There are no aggressive appearing lytic or blastic lesions noted in the visualized portions of the skeleton. IMPRESSION: 1. Widespread metastatic disease to the lungs, small right pleural effusion which is partially loculated and may be malignant, and metastatic lesions in the thoracic spine, as detailed above. A primary source is uncertain, however, the lesions of the thoracic spine demonstrate avid enhancement, which is very similar to the enhancement characteristics of the large thyroid mass. 2. Multiple segmental and subsegmental sized filling defects in the pulmonary arteries bilaterally, compatible with pulmonary embolism. 3. Large heterogeneously enhancing thyroid mass incompletely imaged. Although this may simply represent a thyroid goiter, the possibility of primary thyroid neoplasm should be  considered, particularly in light of the similar enhancement characteristics of the spinal lesions (discussed above). 4. Additional incidental findings, as above. Electronically Signed   By: Vinnie Langton M.D.   On: 05/13/2019 08:58   MR Cervical Spine W or Wo Contrast  Result Date: 05/12/2019 CLINICAL DATA:  Bilateral lower extremity weakness, abnormal lumbar spine EXAM: MRI CERVICAL SPINE WITHOUT AND WITH CONTRAST TECHNIQUE: Multiplanar and multiecho pulse sequences of the cervical spine, to include the craniocervical junction and cervicothoracic junction, were obtained without and with intravenous contrast. CONTRAST:  82mL GADAVIST GADOBUTROL 1 MMOL/ML IV SOLN COMPARISON:  None. FINDINGS: Alignment: Anteroposterior alignment is maintained. Vertebrae: Vertebral body heights are preserved. There is no substantial marrow edema or suspicious osseous lesion. Cord: No abnormal signal. Posterior Fossa, vertebral arteries, paraspinal tissues: Markedly enlarged and heterogeneous thyroid is incompletely evaluated. Disc levels: Minor disc bulges are present. Mild facet and uncovertebral hypertrophy. There is no high-grade degenerative stenosis. IMPRESSION: No evidence cervical spine metastatic disease. Electronically Signed   By: Macy Mis M.D.   On: 05/12/2019 21:50   MR THORACIC SPINE W WO CONTRAST  Result Date: 05/12/2019 CLINICAL DATA:  Lower extremity weakness and numbness, abnormal lumbar spine EXAM: MRI THORACIC WITHOUT AND WITH CONTRAST TECHNIQUE: Multiplanar and multiecho pulse sequences of the thoracic spine were obtained without and with intravenous contrast. CONTRAST:  9mL GADAVIST GADOBUTROL 1 MMOL/ML IV SOLN COMPARISON:  None. FINDINGS: Motion artifact is present. Alignment: Thoracic kyphosis is preserved. No substantial anteroposterior listhesis. Vertebrae: There is abnormal STIR hyperintensity and enhancement at the T7 level extending into the posterior elements on the left. Additional  involvement of the inferior left aspect of the T6 vertebral body. There is extraosseous extension at T7 including the ventral and left lateral spinal canal. Effacement of the left lateral subarachnoid space without cord compression. There is also effacement of the left T7-T8 foramen and partial effacement of the left T6-T7 foramen. Abnormal STIR hyperintensity and enhancement at the T10 level extending into the left much greater than right posterior elements with involvement of the left  tenth rib. Additional involvement of the left aspect of the T9 vertebral body and posterior elements. Significant extraosseous extension at inferior T9 and T10 including left ventral, lateral, and dorsal epidural extension with cord displacement and compression. There is likely abnormal cord signal. Effacement of the left T9-T10 and T10-T11 foramina. Cord:  As above.  Otherwise unremarkable. Paraspinal and other soft tissues: Partially imaged significantly enlarged and heterogeneous thyroid is incompletely evaluated. Disc levels: Mild degenerative disc disease and facet arthropathy. There is no high-grade degenerative stenosis. IMPRESSION: Abnormal signal at T7 and T10 with additional less pronounced adjacent involvement at T6 and T9 consistent with metastatic disease. Extraosseous extension is present at both levels as detailed above. Most notably, there is severe canal stenosis with cord compression and abnormal cord signal at the inferior T9 and T10 level. These results were called by telephone at the time of interpretation on 05/12/2019 at 9:43 pm to provider Madilyn Hook, who verbally acknowledged these results. Electronically Signed   By: Macy Mis M.D.   On: 05/12/2019 21:44   CT ABDOMEN PELVIS W CONTRAST  Result Date: 05/13/2019 CLINICAL DATA:  64 year old male with history of cancer of unknown primary. EXAM: CT CHEST, ABDOMEN, AND PELVIS WITH CONTRAST TECHNIQUE: Multidetector CT imaging of the chest, abdomen and pelvis  was performed following the standard protocol during bolus administration of intravenous contrast. CONTRAST:  155mL OMNIPAQUE IOHEXOL 300 MG/ML  SOLN COMPARISON:  No priors. FINDINGS: CT CHEST FINDINGS Cardiovascular: Heart size is normal. There is no significant pericardial fluid, thickening or pericardial calcification. No atherosclerotic calcifications in the thoracic aorta or the coronary arteries. Filling defects are present within segmental and subsegmental sized pulmonary artery branches in the lungs bilaterally, indicative of pulmonary embolism. The largest of these is in the right lower lobe (axial image 39 of series 3). At this time, these appear to be nonocclusive. Mediastinum/Nodes: Large heterogeneously enhancing thyroid mass incompletely imaged at the level of the thoracic inlet measuring at least 11.8 x 9.8 cm (axial image 1 of series 3). No pathologically enlarged mediastinal or hilar lymph nodes. Esophagus is unremarkable in appearance. No axillary lymphadenopathy. Lungs/Pleura: Multiple small pulmonary nodules are scattered throughout the lungs bilaterally, highly concerning for widespread metastatic disease, largest of which measure up to 9 mm in the left lower lobe (axial image 122 of series 5, and 11 mm in the right lower lobe (axial image 121 of series 5). Small right pleural effusion which is partially loculated laterally, but predominantly layers dependently. No left pleural effusion. No acute consolidative airspace disease. Musculoskeletal: Lytic lesions are noted in the thoracic spine involving both T7 and T10. The largest lesion involves the posterior aspect of the T10 vertebral body, extending through the pedicle into the lateral and posterior elements, as well as involving the adjacent head and neck of the left tenth rib, measuring 5.1 x 3.8 cm (axial image 102 of series 5), encroaching upon and significantly narrowing the central spinal canal. The smaller lesion is at T7 measuring  approximately 3.9 x 2.7 cm also involving the left side of the vertebral body extending into the pedicle and left transverse process, as well as the left paraspinal soft tissues with minimal encroachment upon the central spinal canal. Both of these lesions demonstrate extensive enhancement. CT ABDOMEN PELVIS FINDINGS Hepatobiliary: No suspicious cystic or solid hepatic lesions. No intra or extrahepatic biliary ductal dilatation. Gallbladder is normal in appearance. Pancreas: No pancreatic mass. No pancreatic ductal dilatation. No pancreatic or peripancreatic fluid collections or inflammatory changes.  Spleen: Unremarkable. Adrenals/Urinary Tract: Subcentimeter low-attenuation lesion in the interpolar region of the right kidney, too small to characterize, but statistically likely to represent a tiny cyst. Left kidney and bilateral adrenal glands are normal in appearance. No hydroureteronephrosis. Urinary bladder is normal in appearance. Stomach/Bowel: Normal appearance of the stomach. No pathologic dilatation of small bowel or colon. Normal appendix. Vascular/Lymphatic: Atherosclerotic calcifications in the pelvic vasculature. No aneurysm or dissection noted in the abdominal or pelvic vasculature. No lymphadenopathy noted in the abdomen or pelvis. Reproductive: Prostate gland and seminal vesicles are unremarkable. Other: No significant volume of ascites.  No pneumoperitoneum. Musculoskeletal: There are no aggressive appearing lytic or blastic lesions noted in the visualized portions of the skeleton. IMPRESSION: 1. Widespread metastatic disease to the lungs, small right pleural effusion which is partially loculated and may be malignant, and metastatic lesions in the thoracic spine, as detailed above. A primary source is uncertain, however, the lesions of the thoracic spine demonstrate avid enhancement, which is very similar to the enhancement characteristics of the large thyroid mass. 2. Multiple segmental and  subsegmental sized filling defects in the pulmonary arteries bilaterally, compatible with pulmonary embolism. 3. Large heterogeneously enhancing thyroid mass incompletely imaged. Although this may simply represent a thyroid goiter, the possibility of primary thyroid neoplasm should be considered, particularly in light of the similar enhancement characteristics of the spinal lesions (discussed above). 4. Additional incidental findings, as above. Electronically Signed   By: Vinnie Langton M.D.   On: 05/13/2019 08:58     Treatments: Surgery:   1) T9-10 laminectomy for epidural tumor  2) Left T9 transpedicular resection of epidural tumor, microdissection   Discharge Exam: Blood pressure 105/63, pulse 79, temperature 98.1 F (36.7 C), temperature source Oral, resp. rate 16, height 6\' 1"  (1.854 m), weight 72.1 kg, SpO2 100 %.   Alert and oriented x 4 PERRLA CN II-XII grossly intact MAE, Strength and sensation improving Incision is covered with Honeycomb dressing and Steri Strips; Dressing is clean, dry, and intact   Disposition: Discharge disposition: [place] Not Defined        Allergies as of 05/16/2019   No Known Allergies     Medication List    TAKE these medications   acetaminophen 325 MG tablet Commonly known as: TYLENOL Take 2 tablets (650 mg total) by mouth every 6 (six) hours as needed for mild pain (or Fever >/= 101).   dexamethasone 6 MG tablet Commonly known as: DECADRON Take 1 tablet (6 mg total) by mouth every 6 (six) hours.   diazepam 5 MG tablet Commonly known as: VALIUM Take 1-2 tablets (5-10 mg total) by mouth every 6 (six) hours as needed for muscle spasms.   HYDROcodone-acetaminophen 5-325 MG tablet Commonly known as: NORCO/VICODIN Take 1-2 tablets by mouth every 4 (four) hours as needed for moderate pain.   multivitamin tablet Take 1 tablet by mouth daily.   polyethylene glycol 17 g packet Commonly known as: MIRALAX /  GLYCOLAX Take 17 g by mouth daily as needed for mild constipation.   valACYclovir 500 MG tablet Commonly known as: VALTREX Take 1 tablet (500 mg total) by mouth daily. What changed:   when to take this  reasons to take this      Follow-up Information    Earnie Larsson, MD. Schedule an appointment as soon as possible for a visit in 2 week(s).   Specialty: Neurosurgery Contact information: [address] [place] [address] [phone]  Signed: Patricia Nettle 05/16/2019, 4:20 PM

## 2019-05-16 NOTE — Progress Notes (Signed)
Inpatient Rehabilitation-Admissions Coordinator   Received insurance approval for admit to CIR. I do not have a bed available until Saturday, 05/17/19. Pt has given consent and insurance benefits letter reviewed. He would like to proceed with CIR at this time.   Received medical clearance from attending service for admit to [facility] Saturday 05/17/19. Dr. Ranell Patrick will see patient Sauturday morning to confirm patient readiness. Pt's RN can call [facility] nursing station ([phone]) by noon to get report.  Raechel Ache, OTR/L  Rehab Admissions Coordinator  [phone] 05/16/2019 4:13 PM

## 2019-05-16 NOTE — Progress Notes (Signed)
Physical Therapy Treatment Patient Details Name: Tyrone Washington MRN: [id number] DOB: [birth year] Today's Date: 05/16/2019    History of Present Illness Patient is a 64 y/o male admitted with falling, numbness in LE's.  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  Now s/p T9-10 laminectomy for epidural tumor and Left T9 transpedicular resection of epidural tumor, microdissection.  CT scan 05/13/19 showed multiple small pulmonary nodules scattered in the lung bilaterally and Multiple segmental and subsegmental filling defect in the pulmonary arteries compatible with pulmonary embolism.    PT Comments    Patient progressing with stability with gait and working on gait mechanics for more knee flexion and hip extension through cycle.  Tolerating standing therex and performed functional ADL in session as well.  Continues to be excellent candidate for CIR.  PT to follow.    Follow Up Recommendations  CIR     Equipment Recommendations  Rolling walker with 5" wheels;3in1 (PT)    Recommendations for Other Services       Precautions / Restrictions Precautions Precautions: Fall Precaution Comments: multiple falls at home    Mobility  Bed Mobility Overal bed mobility: Needs Assistance Bed Mobility: Supine to Sit;Sit to Sidelying     Supine to sit: HOB elevated;Supervision   Sit to sidelying: Supervision General bed mobility comments: moved feet over and pulled up on rail to sit; to supine via sidelying increased time to lift L leg  Transfers Overall transfer level: Needs assistance Equipment used: Rolling walker (2 wheeled) Transfers: Sit to/from Stand Sit to Stand: Min assist         General transfer comment: pulls up on foot board on bed, assist for balance  Ambulation/Gait Ambulation/Gait assistance: Min guard;Min assist Gait Distance (Feet): 320 Feet Assistive device: Rolling walker (2 wheeled) Gait Pattern/deviations:  Step-through pattern;Decreased stride length;Trunk flexed;Narrow base of support     General Gait Details: cues for hip extension and more knee flexion as initially with stiff leg gait with some circumduction; minguard for balance; mostly level tile, about 15' incline in hallway with min a   Stairs             Wheelchair Mobility    Modified Rankin (Stroke Patients Only)       Balance Overall balance assessment: Needs assistance   Sitting balance-Leahy Scale: Good     Standing balance support: Bilateral upper extremity supported Standing balance-Leahy Scale: Poor                 High Level Balance Comments: at sink in room performed anterior/posterior rocking up on toes and back on heels with min A cues for spinal precautions            Cognition Arousal/Alertness: Awake/alert Behavior During Therapy: WFL for tasks assessed/performed Overall Cognitive Status: Within Functional Limits for tasks assessed                                        Exercises Other Exercises Other Exercises: standing squats x 5 with UE support Other Exercises: hamstring curls x 5 bilateral with UE support    General Comments General comments (skin integrity, edema, etc.): toileted in bathroom, donned sweat pants seated EOB with S      Pertinent Vitals/Pain Pain Score: 5  Pain Location: around L flank Pain Descriptors / Indicators: Discomfort Pain Intervention(s): Repositioned;Monitored during session  Home Living                      Prior Function            PT Goals (current goals can now be found in the care plan section) Progress towards PT goals: Progressing toward goals    Frequency    Min 5X/week      PT Plan Current plan remains appropriate    Co-evaluation              AM-PAC PT "6 Clicks" Mobility   Outcome Measure  Help needed turning from your back to your side while in a flat bed without using bedrails?: A  Little Help needed moving from lying on your back to sitting on the side of a flat bed without using bedrails?: A Little Help needed moving to and from a bed to a chair (including a wheelchair)?: A Little Help needed standing up from a chair using your arms (e.g., wheelchair or bedside chair)?: A Little Help needed to walk in hospital room?: A Little Help needed climbing 3-5 steps with a railing? : A Little 6 Click Score: 18    End of Session   Activity Tolerance: Patient tolerated treatment well Patient left: in bed;with call bell/phone within reach   PT Visit Diagnosis: Other abnormalities of gait and mobility (R26.89);Repeated falls (R29.6);Muscle weakness (generalized) (M62.81)     Time: [id number] PT Time Calculation (min) (ACUTE ONLY): 38 min  Charges:  $Gait Training: 8-22 mins $Therapeutic Exercise: 8-22 mins $Therapeutic Activity: 8-22 mins                     Magda Kiel, PT Acute Rehabilitation Services [phone] 05/16/2019    Tyrone Washington 05/16/2019, 5:25 PM

## 2019-05-17 ENCOUNTER — Inpatient Hospital Stay (HOSPITAL_COMMUNITY)
Admission: RE | Admit: 2019-05-17 | Discharge: 2019-05-27 | DRG: 091 | Disposition: A | Discharge status: Home Health | Payer: BC Managed Care – PPO | Source: Intra-hospital | Attending: Physical Medicine & Rehabilitation | Admitting: Physical Medicine & Rehabilitation

## 2019-05-17 DIAGNOSIS — G9529 Other cord compression: Secondary | ICD-10-CM | POA: Diagnosis not present

## 2019-05-17 DIAGNOSIS — C7949 Secondary malignant neoplasm of other parts of nervous system: Secondary | ICD-10-CM

## 2019-05-17 DIAGNOSIS — R296 Repeated falls: Secondary | ICD-10-CM | POA: Diagnosis present

## 2019-05-17 DIAGNOSIS — D62 Acute posthemorrhagic anemia: Secondary | ICD-10-CM | POA: Diagnosis present

## 2019-05-17 DIAGNOSIS — M62838 Other muscle spasm: Secondary | ICD-10-CM | POA: Diagnosis not present

## 2019-05-17 DIAGNOSIS — Z7289 Other problems related to lifestyle: Secondary | ICD-10-CM

## 2019-05-17 DIAGNOSIS — J302 Other seasonal allergic rhinitis: Secondary | ICD-10-CM | POA: Diagnosis present

## 2019-05-17 DIAGNOSIS — C7951 Secondary malignant neoplasm of bone: Secondary | ICD-10-CM | POA: Diagnosis not present

## 2019-05-17 DIAGNOSIS — E46 Unspecified protein-calorie malnutrition: Secondary | ICD-10-CM

## 2019-05-17 DIAGNOSIS — E785 Hyperlipidemia, unspecified: Secondary | ICD-10-CM | POA: Diagnosis not present

## 2019-05-17 DIAGNOSIS — Z8249 Family history of ischemic heart disease and other diseases of the circulatory system: Secondary | ICD-10-CM | POA: Diagnosis not present

## 2019-05-17 DIAGNOSIS — D72829 Elevated white blood cell count, unspecified: Secondary | ICD-10-CM | POA: Diagnosis not present

## 2019-05-17 DIAGNOSIS — I2699 Other pulmonary embolism without acute cor pulmonale: Secondary | ICD-10-CM | POA: Diagnosis not present

## 2019-05-17 DIAGNOSIS — Z20822 Contact with and (suspected) exposure to covid-19: Secondary | ICD-10-CM | POA: Diagnosis not present

## 2019-05-17 DIAGNOSIS — Z79899 Other long term (current) drug therapy: Secondary | ICD-10-CM

## 2019-05-17 DIAGNOSIS — G959 Disease of spinal cord, unspecified: Secondary | ICD-10-CM | POA: Diagnosis not present

## 2019-05-17 DIAGNOSIS — T380X5A Adverse effect of glucocorticoids and synthetic analogues, initial encounter: Secondary | ICD-10-CM | POA: Diagnosis not present

## 2019-05-17 DIAGNOSIS — E538 Deficiency of other specified B group vitamins: Secondary | ICD-10-CM | POA: Diagnosis not present

## 2019-05-17 DIAGNOSIS — E8809 Other disorders of plasma-protein metabolism, not elsewhere classified: Secondary | ICD-10-CM | POA: Diagnosis not present

## 2019-05-17 DIAGNOSIS — R918 Other nonspecific abnormal finding of lung field: Secondary | ICD-10-CM | POA: Diagnosis not present

## 2019-05-17 DIAGNOSIS — K5900 Constipation, unspecified: Secondary | ICD-10-CM | POA: Diagnosis present

## 2019-05-17 DIAGNOSIS — F17210 Nicotine dependence, cigarettes, uncomplicated: Secondary | ICD-10-CM | POA: Diagnosis present

## 2019-05-17 DIAGNOSIS — E079 Disorder of thyroid, unspecified: Secondary | ICD-10-CM | POA: Diagnosis present

## 2019-05-17 DIAGNOSIS — R739 Hyperglycemia, unspecified: Secondary | ICD-10-CM | POA: Diagnosis not present

## 2019-05-17 DIAGNOSIS — Z716 Tobacco abuse counseling: Secondary | ICD-10-CM

## 2019-05-17 DIAGNOSIS — D72828 Other elevated white blood cell count: Secondary | ICD-10-CM | POA: Diagnosis not present

## 2019-05-17 LAB — TYPE AND SCREEN
ABO/RH(D): O POS
Antibody Screen: NEGATIVE
Unit division: 0
Unit division: 0
Unit division: 0
Unit division: 0

## 2019-05-17 LAB — BPAM RBC
Blood Product Expiration Date: 202103292359
Blood Product Expiration Date: 202103302359
Blood Product Expiration Date: 202104012359
Blood Product Expiration Date: 202104012359
ISSUE DATE / TIME: 202103021333
ISSUE DATE / TIME: 202103021333
Unit Type and Rh: 5100
Unit Type and Rh: 5100
Unit Type and Rh: 5100
Unit Type and Rh: 5100

## 2019-05-17 MED ORDER — SORBITOL 70 % SOLN: 30.0000 mL | Freq: Every day | Status: DC | PRN

## 2019-05-17 MED ORDER — ACETAMINOPHEN 325 MG PO TABS
650.0000 mg | ORAL_TABLET | Freq: Four times a day (QID) | ORAL | Status: DC | PRN
  Filled 2019-05-17: qty 2

## 2019-05-17 MED ORDER — ONDANSETRON HCL 4 MG/2ML IJ SOLN: 4.0000 mg | Freq: Four times a day (QID) | INTRAMUSCULAR | Status: DC | PRN

## 2019-05-17 MED ORDER — ACETAMINOPHEN 650 MG RE SUPP: 650.0000 mg | Freq: Four times a day (QID) | RECTAL | Status: DC | PRN

## 2019-05-17 MED ORDER — RIVAROXABAN 20 MG PO TABS: 20.0000 mg | ORAL_TABLET | Freq: Every day | ORAL | Status: DC

## 2019-05-17 MED ORDER — POLYETHYLENE GLYCOL 3350 17 G PO PACK: 17.0000 g | PACK | Freq: Every day | ORAL | Status: DC | PRN

## 2019-05-17 MED ORDER — OXYCODONE HCL 5 MG PO TABS: 10.0000 mg | ORAL_TABLET | ORAL | Status: DC | PRN

## 2019-05-17 MED ORDER — RIVAROXABAN 15 MG PO TABS
15.0000 mg | ORAL_TABLET | Freq: Two times a day (BID) | ORAL | Status: DC
  Administered 2019-05-23 – 2019-05-27 (×9): 15 mg via ORAL
  Filled 2019-05-17 (×9): qty 1

## 2019-05-17 MED ORDER — DIAZEPAM 2 MG PO TABS: 5.0000 mg | ORAL_TABLET | Freq: Four times a day (QID) | ORAL | Status: DC | PRN

## 2019-05-17 MED ORDER — ONDANSETRON HCL 4 MG PO TABS: 4.0000 mg | ORAL_TABLET | Freq: Four times a day (QID) | ORAL | Status: DC | PRN

## 2019-05-17 MED ORDER — DEXAMETHASONE 4 MG PO TABS
10.0000 mg | ORAL_TABLET | Freq: Four times a day (QID) | ORAL | Status: DC
  Administered 2019-05-17 – 2019-05-18 (×6): 10 mg via ORAL
  Filled 2019-05-17 (×6): qty 2

## 2019-05-17 MED ORDER — BISACODYL 10 MG RE SUPP: 10.0000 mg | Freq: Every day | RECTAL | Status: DC | PRN

## 2019-05-17 NOTE — Progress Notes (Signed)
Report called to [facility] on [facility]. Pt to be received at [facility]. History and current situation reviewed, Ed had reviewed chart. To leave IV [place] in place. Dressing with ABD covering Honeycomb dressing, dry and intact. Neuro status unchanged from am assessment. Voiding in urinal. Taking pills whole. Appetite is good. Patient aware of transfer to CIR. Simmie Davies RN

## 2019-05-17 NOTE — Progress Notes (Signed)
ANTICOAGULATION CONSULT NOTE - Initial Consult  Pharmacy Consult for xarelto  Indication: pulmonary embolus  No Known Allergies  Patient Measurements:    Vital Signs: Temp: 98.2 F (36.8 C) (03/06 1147) Temp Source: Oral (03/06 1147) BP: 104/67 (03/06 1147) Pulse Rate: 93 (03/06 1500)  Labs: Recent Labs    05/15/19 0930  HGB 7.3*  HCT 21.6*  PLT 218  CREATININE 1.01    Estimated Creatinine Clearance: 74.4 mL/min (by C-G formula based on SCr of 1.01 mg/dL).   Medical History: Past Medical History:  Diagnosis Date  . Allergy    allergic rhinitis  . Arthritis   . B12 deficiency 04/10/2019  . History of chicken pox   . Hyperlipidemia    no meds now   Assessment: 64 year old male who recently underwent T9-10 laminectomy for epidural tumor. PE was found on imaging workup. Plan is to start anticoagulation with xarelto for PE on post-op day #10 which is 3/12 per neurosurgery.    Goal of Therapy:  Monitor platelets by anticoagulation protocol: Yes   Plan:  Xarelto 15mg  bid x 21 days then 20mg  daily -Starting 3/12  Erin Hearing PharmD., BCPS Clinical Pharmacist 05/17/2019 4:46 PM

## 2019-05-17 NOTE — H&P (Signed)
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  Per chart review patient lives alone 1 level home with a few steps to entry.  Independent with assistive device however patient had reported multiple falls.  Patient presented 05/13/2019 with 36-month history of progressive lower extremity weakness and sensory loss.  He denied any bowel or bladder disturbances.  Admission chemistries with hemoglobin of 12, chemistries unremarkable, urinalysis negative nitrite, SARS coronavirus negative.  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  Vertebral body and right sided articular masses however were spared.  He does have some metastatic disease thoracic spine T7 level that was noncompressive as well as in the sacrum.  Staging CT scan obtained March 2 included CT chest abdomen pelvis showing multiple small pulmonary nodules scattered in the lungs bilaterally with the largest of which measuring 9 mm in the left lower lobe and 11 mm in the right lower lobe.  He was noted to have lytic lesions in the thoracic spine including T7 and T10.  Multiple segmental and subsegmental filling defect in the pulmonary arteries compatible with pulmonary embolism.  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 Annette Stable.  Plan is to begin Xarelto for pulmonary emboli postop day 10 beginning 05/23/2019.  Oncology services consulted Dr. Alen Blew 05/14/2019 and radiation Oncology for advanced malignancies with chart  reviewed recommendations were for follow-up cancer center on discharge with pathology report pending.  Patient currently remains on Decadron protocol.  Acute blood loss anemia 8.0.  Review of Systems  Constitutional: Negative for chills and fever.  HENT: Negative for hearing loss.   Eyes: Negative for blurred vision.  Respiratory: Negative for cough and shortness of breath.   Cardiovascular: Negative for palpitations.  Gastrointestinal: Positive for constipation. Negative for heartburn, nausea and vomiting.  Genitourinary: Positive for urgency. Negative for dysuria, flank pain and hematuria.  Musculoskeletal: Positive for joint pain.  Skin: Negative for rash.  Neurological: Positive for sensory change and weakness.  All other systems reviewed and are negative.  Past Medical History:  Diagnosis Date  . Allergy    allergic rhinitis  . Arthritis   . B12 deficiency 04/10/2019  . History of chicken pox   . Hyperlipidemia    no meds now   Past Surgical History:  Procedure Laterality Date  . COLONOSCOPY    . LAMINECTOMY N/A 05/13/2019   Procedure: THORACIC TEN LAMINECTOMY FOR TUMOR;  Surgeon: Earnie Larsson, MD;  Location: [facility];  Service: Neurosurgery;  Laterality: N/A;  . THYROID SURGERY     "Years ago"   Family History  Problem Relation Age of Onset  . Heart disease Father        patient is unsure of history  . CVA Father   . Heart attack Neg Hx   . Colon cancer Neg Hx   . Esophageal cancer Neg Hx   . Rectal cancer Neg Hx   . Stomach cancer Neg Hx    Social History:  reports that he has been smoking. He has never used smokeless tobacco.  He reports current alcohol use of about 7.0 standard drinks of alcohol per week. He reports that he does not use drugs. Allergies: No Known Allergies Medications Prior to Admission  Medication Sig Dispense Refill  . acetaminophen (TYLENOL) 325 MG tablet Take 2 tablets (650 mg total) by mouth every 6 (six) hours as needed for mild pain (or Fever  >/= 101).    Marland Kitchen dexamethasone (DECADRON) 6 MG tablet Take 1 tablet (6 mg total) by mouth every 6 (six) hours.    . diazepam (VALIUM) 5 MG tablet Take 1-2 tablets (5-10 mg total) by mouth every 6 (six) hours as needed for muscle spasms. 30 tablet 0  . HYDROcodone-acetaminophen (NORCO/VICODIN) 5-325 MG tablet Take 1-2 tablets by mouth every 4 (four) hours as needed for moderate pain. 30 tablet 0  . Multiple Vitamin (MULTIVITAMIN) tablet Take 1 tablet by mouth daily.      . polyethylene glycol (MIRALAX / GLYCOLAX) 17 g packet Take 17 g by mouth daily as needed for mild constipation. 14 each 0  . valACYclovir (VALTREX) 500 MG tablet Take 1 tablet (500 mg total) by mouth daily. (Patient taking differently: Take 500 mg by mouth daily as needed (outbreaks). ) 30 tablet 5    Drug Regimen Review Drug regimen was reviewed and remains appropriate with no significant issues identified  Home: Home Living Family/patient expects to be discharged to:: Private residence Living Arrangements: Alone Type of Home: House Home Access: Stairs to enter [employer] of Steps: "couple" Entrance Stairs-Rails: Right Home Layout: One level Bathroom Shower/Tub: Chiropodist: Standard Home Equipment: Cane - single point   Functional History: Prior Function Level of Independence: Independent, Independent with assistive device(s) Comments: walks with cane, multiple recent falls  Functional Status:  Mobility: Bed Mobility Overal bed mobility: Needs Assistance Bed Mobility: Rolling, Sidelying to Sit Rolling: Min assist Sidelying to sit: Min assist, HOB elevated General bed mobility comments: OOB in bathroom upon arrival Transfers Overall transfer level: Needs assistance Equipment used: Rolling walker (2 wheeled) Transfers: Sit to/from Stand Sit to Stand: Min assist General transfer comment: boosting assist from toilet, assist for balance, cues for hand  placement Ambulation/Gait Ambulation/Gait assistance: Min assist Gait Distance (Feet): 100 Feet(x 2) Assistive device: Rolling walker (2 wheeled) Gait Pattern/deviations: Ataxic, Decreased dorsiflexion - right, Decreased stride length, Scissoring, Step-through pattern General Gait Details: heavy reliance on UE support, min A for balance/safety with episodes of scissoring, cues for posture  ADL: ADL Overall ADL's : Needs assistance/impaired Eating/Feeding: Modified independent, Sitting Grooming: Minimal assistance, Standing, Wash/dry face, Oral care Grooming Details (indicate cue type and reason): assist for standing balance Upper Body Bathing: Set up, Min guard, Sitting Lower Body Bathing: Minimal assistance, Sit to/from stand Upper Body Dressing : Set up, Min guard, Sitting Lower Body Dressing: Minimal assistance, Moderate assistance, Sit to/from stand Toilet Transfer: Minimal assistance, Ambulation, RW, Cueing for sequencing, Cueing for safety Toileting- Clothing Manipulation and Hygiene: Minimal assistance, Sit to/from stand Functional mobility during ADLs: Minimal assistance, Rolling walker General ADL Comments: cues for sequencing RW use with mobility tasks  Cognition: Cognition Overall Cognitive Status: Within Functional Limits for tasks assessed Orientation Level: Oriented X4 Cognition Arousal/Alertness: Awake/alert Behavior During Therapy: WFL for tasks assessed/performed Overall Cognitive Status: Within Functional Limits for tasks assessed  Physical Exam: There were no vitals taken for this visit. General: Alert and oriented x 3, No apparent distress HEENT: Head is normocephalic, atraumatic, PERRLA, EOMI, sclera anicteric, oral mucosa pink and moist, dentition intact, ext ear  canals clear,  Neck: Large thyroid mass Heart: Reg rate and rhythm. No murmurs rubs or gallops Chest: CTA bilaterally without wheezes, rales, or rhonchi; no distress Abdomen: Soft, non-tender,  non-distended, bowel sounds positive. Extremities: No clubbing, cyanosis, or edema. Pulses are 2+ Skin: Incision covered with honeycomb dressing and steri-strips; C/D/I Neuro: Pt is cognitively appropriate with normal insight, memory, and awareness. Cranial nerves 2-12 are intact. Sensory exam is normal. Reflexes are 2+ in all 4's. Fine motor coordination is intact. No tremors. Motor function is grossly 4+/5.  Musculoskeletal: Full ROM, No pain with AROM or PROM in the neck, trunk, or extremities. Posture appropriate Psych: Pt's affect is appropriate. Pt is cooperative  No results found for this or any previous visit (from the past 48 hour(s)). No results found.   Medical Problem List and Plan: 1.  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  Oncology.Radiation Oncology services follow-up  -patient may shower  -ELOS/Goals: modI 7-8 days  Admit to CIR 2.  Antithrombotics: -DVT/anticoagulation. CT 3/2 showed multiple small pulmonary nodules scattered in lungs bilaterally and multiple segmental and subsegmental filling defect in pulmonary arteries compatible with PE. : Plan is to begin Xarelto 05/23/2019 and was discussed with neurosurgery Dr.POOL  -antiplatelet therapy: N/A 3. Pain Management: Hydrocodone as needed as well as Valium as needed for muscle spasms. Well controlled.  4. Mood: Provide emotional support  -antipsychotic agents: N/A 5. Neuropsych: This patient is capable of making decisions on his own behalf. 6. Skin/Wound Care: Routine skin checks 7. Fluids/Electrolytes/Nutrition: Routine in and outs with follow-up chemistries 8.  Acute blood loss anemia.  Follow-up CBC. 9.  Constipation.  Laxative assistance 10.  Tobacco abuse.  Counseling  Lavon Paganini Angiulli, PA-C 05/17/2019   I have personally performed a face to face diagnostic evaluation, including, but not limited to  relevant history and physical exam findings, of this patient and developed relevant assessment and plan.  Additionally, I have reviewed and concur with the physician assistant's documentation above.  The patient's status has not changed. The original post admission physician evaluation remains appropriate, and any changes from the pre-admission screening or documentation from the acute chart are noted above.   Leeroy Cha, MD

## 2019-05-17 NOTE — Progress Notes (Signed)
Tyrone Ribas, [place]  Physician  Physical Medicine and Rehabilitation  PMR Pre-admission  Signed  Date of Service:  05/15/2019  2:26 PM      Related encounter: ED to Hosp-Admission (Discharged) from 05/12/2019 in [facility]        Show:Clear all [x]Manual[x]Template[x]Copied  Added by: [x]Tyrone [place], OT[x]Raulkar, Clide Deutscher, [place]  []Hover for details PMR Admission Coordinator Pre-Admission Assessment   Patient: Tyrone [place] is an 64 [place].o., male MRN: [id number] DOB: Mar 07, 1956 Height: 6' 1" (185.4 cm) Weight: 72.1 kg   Insurance Information HMO:     PPO: yes     PCP:      IPA:      80/20:      OTHER:  PRIMARYRickey [place]      Policy#: [id number]      Subscriber: Patient CM Name: Tyrone [place]      Phone#: [phone]     Fax#: [phone] Pre-Cert#: [id number]      Employer: [employer] provided by Tyrone [place] for admite to CIR with start date 3/5. Pt is approved for 14 days with last covered date 3/18. Clinicals are to be faxed to (f): [phone] Benefits:  Phone #: [phone] and [email or web address] provider portal    Eff. Date: 03/14/19-03/12/20     Deduct: $600 ($600 met)      Out of Pocket Max: $3,500 (includes deductible - $1,169.52 met)      Life Max: NA CIR: 80% coverage, 20% co-insurance      SNF: 100% coverage, 0% co-insurnace; limited to 180 days per cal yr; 365 day lifetime limit Outpatient: 80% coverage, 20% co-insurance; limited to 30 visits per cal yr per PT/OT/ST     Home Health: 100% coverage, 0% co-insurance; limited by medical necessity DME: 80% coverage, 20% co-insurance Providers:  SECONDARY: Medicare A and B       Policy#: [id number]      Subscriber: Patient CM Name:       Phone#:      Fax#:  Pre-Cert#:       Employer:  Benefits:  Phone #:      Name:  Eff. Date: verified eligibility via [employer] on 05/16/19     Deduct:       Out of Pocket Max:       Life Max:  CIR:       SNF:  Outpatient:      Co-Pay:  Home Health:        Co-Pay:  DME:      Co-Pay:    Medicaid Application Date:       Case Manager:  Disability Application Date:       Case Worker:    The "Data Collection Information Summary" for patients in Inpatient Rehabilitation Facilities with attached "Privacy Act [place] Records" was provided and verbally reviewed with: Patient   Emergency Contact Information         Contact Information     Name Relation Home Work [place] Daughter     [phone]    Tyrone [place]     [phone]         Current Medical History  Patient Admitting Diagnosis: LE weakness after spinal tumor resection   History of Present Illness:  Tyrone [place] is a 64 year old male with history of hyperlipidemia and tobacco abuse as well as 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  Per chart review patient lives alone 1 level home with a few steps to entry.  Independent with assistive device however patient had reported multiple falls.  Patient presented 05/13/2019 with 10-monthhistory of progressive lower extremity weakness and sensory loss.  He denied any bowel or bladder disturbances.  Admission chemistries with hemoglobin of 12, chemistries unremarkable, urinalysis negative nitrite, SARS coronavirus negative.  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  Vertebral body and right sided articular masses however were spared.  He does have some metastatic disease thoracic spine T7 level that was noncompressive as well as in the sacrum.  Staging CT scan obtained March 2 included CT chest abdomen pelvis showing multiple small pulmonary nodules scattered in the lungs bilaterally with the largest of which measuring 9 mm in the left lower  lobe and 11 mm in the right lower lobe.  He was noted to have lytic lesions in the thoracic spine including T7 and T10.  Multiple segmental and subsegmental filling defect in the pulmonary arteries compatible with pulmonary embolism.  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  Oncology services consulted Dr. SAlen Blew3/05/2019 and radiation Oncology for advanced malignancies with chart reviewed recommendations were for follow-up cancer center on discharge with pathology report pending.  Patient currently remains on Decadron protocol.  Acute blood loss anemia 8.0. Therapies have evaluated and are recommending CIR. Pt is to admit to CIR on 05/17/19.    Complete NIHSS TOTAL: 0   [facility] by the rehabilitation admission coordinator and physician.   Past Medical History      Past Medical History:  Diagnosis Date  . Allergy      allergic rhinitis  . Arthritis    . B12 deficiency 04/10/2019  . History of chicken pox    . Hyperlipidemia      no meds now      Family History   family history includes CVA in his father; Heart disease in his father.   Prior Rehab/Hospitalizations Has the patient had prior rehab or hospitalizations prior to admission? No   Has the patient had major surgery during 100 days prior to admission? Yes              Current Medications   Current Facility-Administered Medications:  .  0.9 %  sodium chloride infusion, 250 mL, Intravenous, PRN, PEarnie Larsson [place] .  0.9 %  sodium chloride infusion, 250 mL, Intravenous, Continuous, Tyrone [place] .  0.9 %  sodium chloride infusion, , Intravenous, Continuous, Tyrone Pall [place], Last Rate: 100 mL/hr at 05/15/19 0800, Rate Verify at 05/15/19 0800 .  acetaminophen (TYLENOL) tablet 650 mg, 650  mg, Oral, Q6H PRN **OR** acetaminophen (TYLENOL) suppository 650 mg, 650 mg, Rectal, Q6H PRN, Pool, HMallie Mussel [place] .  bisacodyl (DULCOLAX) suppository 10 mg, 10 mg, Rectal, Daily PRN, PEarnie Larsson [place] .  Chlorhexidine Gluconate Cloth 2 % PADS 6 each, 6 each, Topical, Daily, PEarnie Larsson [place], 6 each at 05/16/19 0740 .  dexamethasone (DECADRON) injection 10 mg, 10 mg, Intravenous, Q6H, Pool, HMallie Mussel [place], 10 mg at 05/16/19 1727 .  diazepam (VALIUM) tablet 5-10  mg, 5-10 mg, Oral, Q6H PRN, Earnie Larsson, [place] .  HYDROcodone-acetaminophen (NORCO/VICODIN) 5-325 MG per tablet 1-2 tablet, 1-2 tablet, Oral, Q4H PRN, Earnie Larsson, [place] .  HYDROmorphone (DILAUDID) injection 1 mg, 1 mg, Intravenous, Q2H PRN, Earnie Larsson, [place] .  iohexol (OMNIPAQUE) 350 MG/ML injection 75 mL, 75 mL, Intravenous, Once PRN, Earnie Larsson, [place] .  menthol-cetylpyridinium (CEPACOL) lozenge 3 mg, 1 lozenge, Oral, PRN **OR** phenol (CHLORASEPTIC) mouth spray 1 spray, 1 spray, Mouth/Throat, PRN, Pool, Mallie Mussel, [place] .  ondansetron (ZOFRAN) tablet 4 mg, 4 mg, Oral, Q6H PRN **OR** ondansetron (ZOFRAN) injection 4 mg, 4 mg, Intravenous, Q6H PRN, Earnie Larsson, [place] .  oxyCODONE (Oxy IR/ROXICODONE) immediate release tablet 10 mg, 10 mg, Oral, Q3H PRN, Earnie Larsson, [place] .  phenylephrine (NEOSYNEPHRINE) 10-0.9 MG/250ML-% infusion, 0-400 mcg/min, Intravenous, Titrated, Albertha Ghee, [place], Stopped at 05/14/19 1509 .  polyethylene glycol (MIRALAX / GLYCOLAX) packet 17 g, 17 g, Oral, Daily PRN, Pool, Mallie Mussel, [place] .  sodium chloride flush (NS) 0.9 % injection 3 mL, 3 mL, Intravenous, Q12H, Pool, Mallie Mussel, [place], 3 mL at 05/16/19 0740 .  sodium chloride flush (NS) 0.9 % injection 3 mL, 3 mL, Intravenous, PRN, Pool, Mallie Mussel, [place] .  sodium chloride flush (NS) 0.9 % injection 3 mL, 3 mL, Intravenous, Q12H, Earnie Larsson, [place], 3 mL at 05/16/19 0739 .  sodium chloride flush (NS) 0.9 % injection 3 mL, 3 mL, Intravenous, PRN, Tyrone [place] .  sodium phosphate (FLEET) 7-19 GM/118ML enema 1 enema, 1  enema, Rectal, Once PRN, Earnie Larsson, [place]   Patients Current Diet:     Diet Order                      Diet regular Room service appropriate? Yes; Fluid consistency: Thin  Diet effective now                   Precautions / Restrictions Precautions Precautions: Fall Precaution Comments: multiple falls at home Restrictions Weight Bearing Restrictions: No    Has the patient had 2 or more falls or a fall with injury in the past year? Yes   Prior Activity Level Community (5-7x/wk): active PTA, due to symptom progression has been on short term disability since october from [employer]. used rollator and cane during times of decline   Prior Functional Level Self Care: Did the patient need help bathing, dressing, using the toilet or eating? Independent   Indoor Mobility: Did the patient need assistance with walking from room to room (with or without device)? Independent   Stairs: Did the patient need assistance with internal or external stairs (with or without device)? Independent   Functional Cognition: Did the patient need help planning regular tasks such as shopping or remembering to take medications? [place] Devices/Equipment: Cane (specify quad or straight), Eyeglasses Home Equipment: Cane - single point   Prior Device Use: Indicate devices/aids used by the patient prior to current illness, exacerbation or injury? Rollator and cane   Current Functional Level Cognition   Overall Cognitive Status: Within Functional Limits for tasks assessed Orientation Level: Oriented X4    Extremity Assessment (includes Sensation/Coordination)   Upper Extremity Assessment: Overall WFL for tasks assessed  Lower Extremity Assessment: Defer to PT evaluation RLE Deficits / Details: AROM WFL, strength hip flexion 3/5, knee extension 4/5, ankle DF 4/5 RLE Sensation: decreased light touch(numbness/tingling all the way up to clavicular area,  worse distal) RLE Coordination: decreased fine  motor, decreased gross motor LLE Deficits / Details: AAROM WFL, strength hip flexion 3-/5, knee extension 4-/5, ankle DF 3+/5 LLE Sensation: decreased light touch(numbness/tingling all the way up to clavicular area, worse distal) LLE Coordination: decreased gross motor, decreased fine motor     ADLs   Overall ADL's : Needs assistance/impaired Eating/Feeding: Modified independent, Sitting Grooming: Minimal assistance, Standing, Wash/dry face, Oral care Grooming Details (indicate cue type and reason): assist for standing balance Upper Body Bathing: Set up, Min guard, Sitting Lower Body Bathing: Minimal assistance, Sit to/from stand Upper Body Dressing : Set up, Min guard, Sitting Lower Body Dressing: Minimal assistance, Moderate assistance, Sit to/from stand Toilet Transfer: Minimal assistance, Ambulation, RW, Cueing for sequencing, Cueing for safety Toileting- Clothing Manipulation and Hygiene: Minimal assistance, Sit to/from stand Functional mobility during ADLs: Minimal assistance, Rolling walker General ADL Comments: cues for sequencing RW use with mobility tasks     Mobility   Overal bed mobility: Needs Assistance Bed Mobility: Supine to Sit, Sit to Sidelying Rolling: Min assist Sidelying to sit: Min assist Supine to sit: HOB elevated, Supervision Sit to sidelying: Supervision General bed mobility comments: moved feet over and pulled up on rail to sit; to supine via sidelying increased time to lift L leg     Transfers   Overall transfer level: Needs assistance Equipment used: Rolling walker (2 wheeled) Transfers: Sit to/from Stand Sit to Stand: Min assist General transfer comment: pulls up on foot board on bed, assist for balance     Ambulation / Gait / Stairs / Wheelchair Mobility   Ambulation/Gait Ambulation/Gait assistance: Min guard, Min assist Gait Distance (Feet): 320 Feet Assistive device: Rolling walker (2  wheeled) Gait Pattern/deviations: Step-through pattern, Decreased stride length, Trunk flexed, Narrow base of support General Gait Details: cues for hip extension and more knee flexion as initially with stiff leg gait with some circumduction; minguard for balance; mostly level tile, about 15' incline in hallway with min a     Posture / Balance Balance Overall balance assessment: Needs assistance Sitting-balance support: Feet supported Sitting balance-Leahy Scale: Good Standing balance support: Bilateral upper extremity supported Standing balance-Leahy Scale: Poor Standing balance comment: UE support for standing balance, min A for dynamic balance High Level Balance Comments: at sink in room performed anterior/posterior rocking up on toes and back on heels with min A cues for spinal precautions     Special needs/care consideration BiPAP/CPAP : no CPM : no Continuous Drip IV : 0.9% sodium chloride infusion, 0.9% sodium chloride infusion  Dialysis : no        Days : no Life Vest : no Oxygen : no, on RA Special Bed : no Trach Size : no Wound Vac (area) : no      Location : no Skin: surgical incision to back           Bowel mgmt: last BM 05/15/19 Bladder mgmt: continent Diabetic mgmt: no Behavioral consideration : no Chemo/radiation : consideration of radiation in a few weeks.     Previous Home Environment (from acute therapy documentation) Living Arrangements: Alone Type of Home: House Home Layout: One level Home Access: Stairs to enter Entrance Stairs-Rails: Right Entrance Stairs-Number of Steps: "couple" Bathroom Shower/Tub: Chiropodist: Standard Home Care Services: No   Discharge Living Setting Plans for Discharge Living Setting: Patient's home, Alone, Other (Comment)(sister can check on him intermittantly ) Type of Home at Discharge: House Discharge Home Layout: One level Discharge Home Access: Stairs to enter Entrance Stairs-Rails: Left  Entrance  Stairs-Number of Steps: 2-3 steps Discharge Bathroom Shower/Tub: Tub/shower unit Discharge Bathroom Toilet: Standard Discharge Bathroom Accessibility: Yes How Accessible: Accessible via walker Does the patient have any problems obtaining your medications?: No   Social/Family/Support Systems Patient Roles: Other (Comment)(worked at [employer] until decline in October) Contact Information: sister Tyrone [place]: [phone] Anticipated Caregiver: sister Anticipated Caregiver's Contact Information: see above Ability/Limitations of Caregiver: Supervision -can only check on 1-2x/day (can make sure meals prepped).  Caregiver Availability: Intermittent(1-2x/day) Discharge Plan Discussed with Primary Caregiver: Yes(pt and sister) Is Caregiver In Agreement with Plan?: Yes Does Caregiver/Family have Issues with Lodging/Transportation while Pt is in Rehab?: No   Goals/Additional Needs Patient/Family Goal for Rehab: PT/OT: Mod I; SLP: NA Expected length of stay: 6-9 days Cultural Considerations: NA Dietary Needs: regular diet, thin liquids Equipment Needs: TBD Special Service Needs: may need radiation therapy Pt/Family Agrees to Admission and willing to participate: Yes Program Orientation Provided & Reviewed with Pt/Caregiver Including Roles  & Responsibilities: Yes(pt and his sister )  Barriers to Discharge: Home environment access/layout, Lack of/limited family support, Pending chemo/radiation  Barriers to Discharge Comments: steps to enter home; limited supervision available. may need follow up radiation treatment   Decrease burden of Care through IP rehab admission: NA   Possible need for SNF placement upon discharge: Not anticipated; Anticipate pt can get to Mod I/supervision level which is a level he can DC home to. Pt has a sister who lives nearby who can provide daily check ins. Pt is highly motivated to return to independence.    Patient Condition: I have reviewed medical records from [facility], spoken with NP, and patient and family member. I met with patient at the bedside for inpatient rehabilitation assessment.  Patient will benefit from ongoing PT and OT, can actively participate in 3 hours of therapy a day 5 days of the week, and can make measurable gains during the admission.  Patient will also benefit from the coordinated team approach during an Inpatient Acute Rehabilitation admission.  The patient will receive intensive therapy as well as Rehabilitation physician, nursing, social worker, and care management interventions.  Due to safety, skin/wound care, disease management, medication administration, pain management and patient education the patient requires 24 hour a day rehabilitation nursing.  The patient is currently Min G/Min A with mobility and Mod I to Mod A for basic ADLs.  Discharge setting and therapy post discharge at home with home health is anticipated.  Patient has agreed to participate in the Acute Inpatient Rehabilitation Program and will admit tomorrow.   Preadmission Screen Completed By:  Raechel Ache, 05/16/2019 6:14 PM ______________________________________________________________________   Discussed status with Dr. Ranell Patrick on 05/16/19 at 3:25PM and received approval for admission tomorrow, 05/17/19.   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 Does the need for close, 24 hr/day Medical supervision in concert with the patient's rehab needs make it unreasonable for this patient to be served in a less intensive setting? Yes 2. Co-Morbidities requiring supervision/potential complications: HLD, tobacco abuse, large follicular thyroid mass 3. Due to bladder management, bowel management, safety, skin/wound care, disease management, medication administration, pain management and patient education, does the patient require 24 hr/day  rehab nursing? Yes 4. Does the patient require coordinated care of a physician, rehab nurse, PT, OT, and SLP to address physical and functional deficits in the context of the above medical diagnosis(es)? Yes  Addressing deficits in the following areas: balance, endurance, locomotion, strength, transferring, bowel/bladder control, bathing, dressing, feeding, grooming, toileting and psychosocial support 5. Can the patient actively participate in an intensive therapy program of at least 3 hrs of therapy 5 days a week? Yes 6. The potential for patient to make measurable gains while on inpatient rehab is excellent 7. Anticipated functional outcomes upon discharge from inpatient rehab: modified independent PT, modified independent OT, modified independent SLP 8. Estimated rehab length of stay to reach the above functional goals is: 7-8 days 9. Anticipated discharge destination: Home 10. Overall Rehab/Functional Prognosis: excellent     [place] Signature: Leeroy Cha, [place]        Revision History

## 2019-05-17 NOTE — Progress Notes (Signed)
Patient arrved from $NP [facility], appears alert and deniesnpain.

## 2019-05-18 ENCOUNTER — Inpatient Hospital Stay (HOSPITAL_COMMUNITY): Payer: BC Managed Care – PPO | Admitting: Occupational Therapy

## 2019-05-18 ENCOUNTER — Inpatient Hospital Stay (HOSPITAL_COMMUNITY): Payer: BC Managed Care – PPO | Admitting: Physical Therapy

## 2019-05-18 DIAGNOSIS — G959 Disease of spinal cord, unspecified: Secondary | ICD-10-CM

## 2019-05-18 LAB — CBC
HCT: 22.9 % — ABNORMAL LOW (ref 39.0–52.0)
Hemoglobin: 7.4 g/dL — ABNORMAL LOW (ref 13.0–17.0)
MCH: 30.2 pg (ref 26.0–34.0)
MCHC: 32.3 g/dL (ref 30.0–36.0)
MCV: 93.5 fL (ref 80.0–100.0)
Platelets: 277 10*3/uL (ref 150–400)
RBC: 2.45 MIL/uL — ABNORMAL LOW (ref 4.22–5.81)
RDW: 14.8 % (ref 11.5–15.5)
WBC: 18.8 10*3/uL — ABNORMAL HIGH (ref 4.0–10.5)
nRBC: 1.9 % — ABNORMAL HIGH (ref 0.0–0.2)

## 2019-05-18 LAB — BASIC METABOLIC PANEL
Anion gap: 10 (ref 5–15)
BUN: 18 mg/dL (ref 8–23)
CO2: 24 mmol/L (ref 22–32)
Calcium: 8.1 mg/dL — ABNORMAL LOW (ref 8.9–10.3)
Chloride: 104 mmol/L (ref 98–111)
Creatinine, Ser: 1.05 mg/dL (ref 0.61–1.24)
GFR calc Af Amer: >60 mL/min (ref >60)
GFR calc non Af Amer: >60 mL/min (ref >60)
Glucose, Bld: 139 mg/dL — ABNORMAL HIGH (ref 70–99)
Potassium: 3.7 mmol/L (ref 3.5–5.1)
Sodium: 138 mmol/L (ref 135–145)

## 2019-05-18 NOTE — Evaluation (Signed)
Physical Therapy Assessment and Plan  Patient Details  Name: Tyrone Washington MRN: [id number] Date of Birth: May 06, 1955  PT Diagnosis: Ataxia, Ataxic gait, Impaired sensation and Muscle weakness Rehab Potential: Good ELOS: 7 days   Today's Date: 05/18/2019 PT Individual Time: 1100-1200 PT Individual Time Calculation (min): 60 min    Problem List:  Patient Active Problem List   Diagnosis Date Noted  . Myelopathy ([place]) 05/17/2019  . 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 B12 deficiency 04/10/2019  . 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 Preventative health care 07/16/2015  . Nonspecific abnormal electrocardiogram (ECG) (EKG) 07/16/2015  . Migraine headache 11/10/2013  . WEIGHT LOSS 05/27/2010  . Goiter 01/14/2009  . 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 GENITAL HERPES 12/15/2008  . Hyperlipidemia 12/15/2008  . ALLERGIC RHINITIS 12/15/2008  . Neck mass 12/15/2008    Past Medical History:  Past Medical History:  Diagnosis Date  . Allergy    allergic rhinitis  . Arthritis   . B12 deficiency 04/10/2019  . History of chicken pox   . Hyperlipidemia    no meds now   Past Surgical History:  Past Surgical History:  Procedure Laterality Date  . COLONOSCOPY    . LAMINECTOMY N/A 05/13/2019   Procedure: THORACIC TEN LAMINECTOMY FOR TUMOR;  Surgeon: Earnie Larsson, MD;  Location: [facility];  Service: Neurosurgery;  Laterality: N/A;  . 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  Per chart review patient lives alone 1 level home with a few steps to entry.  Independent with assistive device however patient had reported multiple falls.  Patient presented 05/13/2019 with 53-monthhistory of progressive  lower extremity weakness and sensory loss.  He denied any bowel or bladder disturbances.  Admission chemistries with hemoglobin of 12, chemistries unremarkable, urinalysis negative nitrite, SARS coronavirus negative.  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  Vertebral body and right sided articular masses however were spared.  He does have some metastatic disease thoracic spine T7 level that was noncompressive as well as in the sacrum.  Staging CT scan obtained March 2 included CT chest abdomen pelvis showing multiple small pulmonary nodules scattered in the lungs bilaterally with the largest of which measuring 9 mm in the left lower lobe and 11 mm in the right lower lobe.  He was noted to have lytic lesions in the thoracic spine including T7 and T10.  Multiple segmental and subsegmental filling defect in the pulmonary arteries compatible with pulmonary embolism.  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 PAnnette Stable  Plan is to begin Xarelto for pulmonary emboli postop day 10 beginning 05/23/2019.  Oncology services consulted Dr. SAlen Blew3/05/2019 and radiation Oncology for advanced malignancies with chart reviewed recommendations were for follow-up cancer center on discharge with pathology report pending.  Patient currently remains on Decadron protocol.  Acute blood loss anemia 8.0.   Patient transferred to CIR on 05/17/2019 .   Patient currently requires min with mobility secondary to muscle weakness, decreased  cardiorespiratoy endurance and ataxia and decreased coordination.  Prior to hospitalization, patient was modified independent  with mobility and lived with Alone in a House home.  Home access is 2-3 stepsStairs to  enter.  Patient will benefit from skilled PT intervention to maximize safe functional mobility, minimize fall risk and decrease caregiver burden for planned discharge home alone.  Anticipate patient will benefit from follow up [place] at discharge.  PT - End of Session Activity Tolerance: Tolerates 30+ min activity with multiple rests Endurance Deficit: Yes PT Assessment Rehab Potential (ACUTE/IP ONLY): Good PT Barriers to Discharge: Decreased caregiver support PT Patient demonstrates impairments in the following area(s): Balance;Endurance;Motor PT Transfers Functional Problem(s): Bed Mobility;Bed to Chair;Car PT Locomotion Functional Problem(s): Ambulation;Stairs PT Plan PT Intensity: Minimum of 1-2 x/day ,45 to 90 minutes PT Frequency: 5 out of 7 days PT Duration Estimated Length of Stay: 7 days PT Treatment/Interventions: Ambulation/gait training;Balance/vestibular training;Community reintegration;Functional electrical stimulation;DME/adaptive equipment instruction;Disease management/prevention;Functional mobility training;Neuromuscular re-education;Patient/family education;Stair training;Splinting/orthotics;Therapeutic Activities;Therapeutic Exercise;UE/LE Strength taining/ROM;UE/LE Coordination activities PT Transfers Anticipated Outcome(s): mod I transfers PT Locomotion Anticipated Outcome(s): mod I gait and stairs PT Recommendation Follow Up Recommendations: Home health PT Patient destination: Home Equipment Recommended: To be determined  Skilled Therapeutic Intervention Pt was seen bedside in the am. PT evaluation completed and treatment plan initiated. Pt ambulated 150 feet x 2 with rolling walker and S to c/g. Pt ambulated 80 and 150 feet with st cane and c/g. Pt returned to room following treatment and left sitting up in bed with bed alarm on and call bell within reach. Pt educated on PT plan of care and interventions. All questions answered.   PT  Evaluation Precautions/Restrictions Precautions Precautions: Fall Restrictions Weight Bearing Restrictions: No General Chart Reviewed: Yes Family/Caregiver Present: No Vital Signs  Pain Pain Assessment Pain Scale: 0-10 Pain Score: 0-No pain Home Living/Prior Functioning Home Living Type of Home: House Home Access: Stairs to enter [employer] of Steps: 2-3 steps Entrance Stairs-Rails: Right Home Layout: One level  Lives With: Alone Prior Function Level of Independence: Independent with gait;Independent with transfers;Requires assistive device for independence  Able to Take Stairs?: Yes Driving: Yes Comments: mutliple falls Vision/Perception  Perception Perception: Within Functional Limits  Cognition Overall Cognitive Status: Within Functional Limits for tasks assessed Orientation Level: Oriented X4 Memory: Appears intact Awareness: Appears intact Problem Solving: Appears intact Safety/Judgment: Appears intact Sensation Sensation Light Touch: Impaired Detail Light Touch Impaired Details: Impaired RLE;Impaired LLE(impaired B LEs mid shin to feet) Proprioception: Appears Intact;Impaired Detail Proprioception Impaired Details: Impaired RLE;Impaired LLE Coordination Gross Motor Movements are Fluid and Coordinated: No Motor  Motor Motor: Ataxia  Mobility Bed Mobility Bed Mobility: Supine to Sit;Sit to Supine Supine to Sit: Supervision/Verbal cueing Sit to Supine: Supervision/Verbal cueing Transfers Transfers: Sit to Stand;Stand to Sit;Stand Pivot Transfers Sit to Stand: Contact Guard/Touching assist Stand to Sit: Contact Guard/Touching assist Stand Pivot Transfers: Contact Guard/Touching assist Transfer (Assistive device): None Locomotion  Gait Ambulation: Yes Gait Assistance: Minimal Assistance - Patient > 75% Gait Distance (Feet): 20 Feet Assistive device: None Stairs / Additional Locomotion Stairs: Yes Stairs Assistance: Contact Guard/Touching  assist Stair Management Technique: Two rails Number of Stairs: 12 Height of Stairs: 6 Wheelchair Mobility Wheelchair Mobility: No  Trunk/Postural Assessment  Cervical Assessment Cervical Assessment: Exceptions to [facility] Thoracic Assessment Thoracic Assessment: Exceptions to [facility] Lumbar Assessment Lumbar Assessment: Exceptions to [facility] Postural Control Postural Control: Deficits on evaluation  Balance Balance Balance Assessed: Yes Static Sitting Balance Static Sitting - Balance Support: Feet supported;Bilateral  upper extremity supported Static Sitting - Level of Assistance: 5: Stand by assistance Static Standing Balance Static Standing - Balance Support: During functional activity Static Standing - Level of Assistance: 4: Min assist Dynamic Standing Balance Dynamic Standing - Balance Support: During functional activity Dynamic Standing - Level of Assistance: 4: Min assist Extremity Assessment  B UEs as per OT evaluation.  RLE Assessment RLE Assessment: Exceptions to [facility] Passive Range of Motion (PROM) Comments: WFLs General Strength Comments: grossly 2+/5 -3-/5 LLE Assessment LLE Assessment: Exceptions to [facility] Passive Range of Motion (PROM) Comments: WFLs General Strength Comments: grossly 2/5-2+/5    Refer to Care Plan for Long Term Goals  Recommendations for other services: None   Discharge Criteria: Patient will be discharged from PT if patient refuses treatment 3 consecutive times without medical reason, if treatment goals not met, if there is a change in medical status, if patient makes no progress towards goals or if patient is discharged from hospital.  The above assessment, treatment plan, treatment alternatives and goals were discussed and mutually agreed upon: by patient  Dub Amis 05/18/2019, 12:49 PM

## 2019-05-18 NOTE — Plan of Care (Signed)
  Problem: RH Balance Goal: LTG Patient will maintain dynamic standing with ADLs (OT) Description: LTG:  Patient will maintain dynamic standing balance with assist during activities of daily living (OT)  Flowsheets (Taken 05/18/2019 1622) LTG: Pt will maintain dynamic standing balance during ADLs with: Independent with assistive device   Problem: Sit to Stand Goal: LTG:  Patient will perform sit to stand in prep for activites of daily living with assistance level (OT) Description: LTG:  Patient will perform sit to stand in prep for activites of daily living with assistance level (OT) Flowsheets (Taken 05/18/2019 1622) LTG: PT will perform sit to stand in prep for activites of daily living with assistance level: Independent with assistive device   Problem: RH Grooming Goal: LTG Patient will perform grooming w/assist,cues/equip (OT) Description: LTG: Patient will perform grooming with assist, with/without cues using equipment (OT) Flowsheets (Taken 05/18/2019 1622) LTG: Pt will perform grooming with assistance level of: Independent with assistive device    Problem: RH Bathing Goal: LTG Patient will bathe all body parts with assist levels (OT) Description: LTG: Patient will bathe all body parts with assist levels (OT) Flowsheets (Taken 05/18/2019 1622) LTG: Pt will perform bathing with assistance level/cueing: Independent with assistive device    Problem: RH Dressing Goal: LTG Patient will perform upper body dressing (OT) Description: LTG Patient will perform upper body dressing with assist, with/without cues (OT). Flowsheets (Taken 05/18/2019 1622) LTG: Pt will perform upper body dressing with assistance level of: Independent with assistive device Goal: LTG Patient will perform lower body dressing w/assist (OT) Description: LTG: Patient will perform lower body dressing with assist, with/without cues in positioning using equipment (OT) Flowsheets (Taken 05/18/2019 1622) LTG: Pt will perform lower  body dressing with assistance level of: Independent with assistive device   Problem: RH Toileting Goal: LTG Patient will perform toileting task (3/3 steps) with assistance level (OT) Description: LTG: Patient will perform toileting task (3/3 steps) with assistance level (OT)  Flowsheets (Taken 05/18/2019 1622) LTG: Pt will perform toileting task (3/3 steps) with assistance level: Independent with assistive device   Problem: RH Toilet Transfers Goal: LTG Patient will perform toilet transfers w/assist (OT) Description: LTG: Patient will perform toilet transfers with assist, with/without cues using equipment (OT) Flowsheets (Taken 05/18/2019 1622) LTG: Pt will perform toilet transfers with assistance level of: Independent with assistive device   Problem: RH Tub/Shower Transfers Goal: LTG Patient will perform tub/shower transfers w/assist (OT) Description: LTG: Patient will perform tub/shower transfers with assist, with/without cues using equipment (OT) Flowsheets (Taken 05/18/2019 1622) LTG: Pt will perform tub/shower stall transfers with assistance level of: Independent with assistive device

## 2019-05-18 NOTE — Progress Notes (Signed)
Physical Therapy Session Note  Patient Details  Name: Tyrone Washington MRN: [id number] Date of Birth: Aug 08, 1955  Today's Date: 05/18/2019 PT Individual Time: [id number] PT Individual Time Calculation (min): 64 min   Short Term Goals: Week 1:  PT Short Term Goal 1 (Week 1): STGs = LTGs  Skilled Therapeutic Interventions/Progress Updates:  Pt was seen bedside in the pm. Pt completed bed mobility with S. Pt performed sit to stand transfers with S and st cane. Pt performed stand pivot and toilet transfers with c/g and verbal cues. Pt performed cone taps 3 sets x 10 reps each for NMR. Pt rode Nu-step 5 minutes x 2 at level 3. Pt ambulated 125 feet x 2 and 150 feet x 2 with st cane and c/g with verbal cues. Pt returned to room and left sitting up in bed with bed alarm on and call bell within reach. Pt ambulates with wide BOS and decreased cadence.   Therapy Documentation Precautions:  Precautions Precautions: Fall Restrictions Weight Bearing Restrictions: No General:  Pain: No c/o pain.   Therapy/Group: Individual Therapy  Dub Amis 05/18/2019, 4:00 PM

## 2019-05-18 NOTE — Progress Notes (Signed)
[facility] PHYSICAL MEDICINE & REHABILITATION PROGRESS NOTE   Subjective/Complaints: Had a good transition to CIR. No complaints. Denies pain, constipation. Slept well last night.  ROS: denies pain, constipation, insomnia.  Objective:   No results found. No results for input(s): WBC, HGB, HCT, PLT in the last 72 hours. No results for input(s): NA, K, CL, CO2, GLUCOSE, BUN, CREATININE, CALCIUM in the last 72 hours.  Intake/Output Summary (Last 24 hours) at 05/18/2019 1223 Last data filed at 05/18/2019 0900 Gross per 24 hour  Intake 560 ml  Output 2875 ml  Net -2315 ml     Physical Exam: Vital Signs Blood pressure 117/77, pulse 80, temperature 98.3 F (36.8 C), resp. rate 17, SpO2 100 %. General: Alert and oriented x 3, No apparent distress. Sitting up comfortably in bed.  HEENT: Head is normocephalic, atraumatic, PERRLA, EOMI, sclera anicteric, oral mucosa pink and moist, dentition intact, ext ear canals clear,  Neck: Large thyroid mass Heart: Reg rate and rhythm. No murmurs rubs or gallops Chest: CTA bilaterally without wheezes, rales, or rhonchi; no distress Abdomen: Soft, non-tender, non-distended, bowel sounds positive. Extremities: No clubbing, cyanosis, or edema. Pulses are 2+ Skin: Incision covered with honeycomb dressing and steri-strips; C/D/I Neuro: Pt is cognitively appropriate with normal insight, memory, and awareness. Cranial nerves 2-12 are intact. Sensory exam is normal. Reflexes are 2+ in all 4's. Fine motor coordination is intact. No tremors. Motor function is grossly 4+/5.  Musculoskeletal: Full ROM, No pain with AROM or PROM in the neck, trunk, or extremities. Posture appropriate Psych: Pt's affect is appropriate. Pt is cooperative   Assessment/Plan: 1. 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  which require 3+ hours  per day of interdisciplinary therapy in a comprehensive inpatient rehab setting.  Physiatrist is providing close team supervision and 24 hour management of active medical problems listed below.  Physiatrist and rehab team continue to assess barriers to discharge/monitor patient progress toward functional and medical goals  Care Tool:  Bathing    Body parts bathed by patient: Right arm, Left arm, Chest, Abdomen, Front perineal area, Buttocks, Right upper leg, Left upper leg, Right lower leg, Left lower leg, Face         Bathing assist Assist Level: Contact Guard/Touching assist(simulated)     Upper Body Dressing/Undressing Upper body dressing   What is the patient wearing?: Hospital gown only    Upper body assist Assist Level: Minimal Assistance - Patient > 75%    Lower Body Dressing/Undressing Lower body dressing      What is the patient wearing?: Pants     Lower body assist Assist for lower body dressing: Contact Guard/Touching assist     Toileting Toileting Toileting Activity did not occur (Clothing management and hygiene only): N/A (no void or bm)  Toileting assist Assist for toileting: Supervision/Verbal cueing Assistive Device Comment: urinal   Transfers Chair/bed transfer  Transfers assist  Chair/bed transfer activity did not occur: Safety/medical concerns  Chair/bed transfer assist level: Minimal Assistance - Patient > 75%     Locomotion Ambulation   Ambulation assist              Walk 10 feet activity   Assist           Walk 50 feet activity   Assist           Walk 150 feet activity   Assist  Walk 10 feet on uneven surface  activity   Assist           Wheelchair     Assist               Wheelchair 50 feet with 2 turns activity    Assist            Wheelchair 150 feet activity     Assist          Blood pressure 117/77, pulse 80, temperature 98.3 F (36.8 C), resp. rate  17, SpO2 100 %.  Medical Problem List and Plan: 1.  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  Oncology.Radiation Oncology services follow-up             -patient may shower             -ELOS/Goals: modI 7-8 days             Initial CIR evaluations  2.  Antithrombotics: -DVT/anticoagulation. CT 3/2 showed multiple small pulmonary nodules scattered in lungs bilaterally and multiple segmental and subsegmental filling defect in pulmonary arteries compatible with PE. : Plan is to begin Xarelto 05/23/2019 and was discussed with neurosurgery Dr.POOL             -antiplatelet therapy: N/A 3. Pain Management: Oxycodone as needed as well as Valium as needed for muscle spasms. Continues to be well controlled. Has not used oxycodone.  4. Mood: Provide emotional support             -antipsychotic agents: N/A 5. Neuropsych: This patient is capable of making decisions on his own behalf. 6. Skin/Wound Care: Routine skin checks 7. Fluids/Electrolytes/Nutrition: Routine in and outs with follow-up chemistries. BMP ordered today with CBC.  8.  Acute blood loss anemia.  No admission labs ordered. CBC from 3/4 reviewed and Hgb was 7.3, down from 8. Will repeat CBC today.  9.  Constipation.  Laxative assistance 10.  Tobacco abuse.  Counseling    LOS: 1 days A FACE TO FACE EVALUATION WAS PERFORMED  Tyrone Washington 05/18/2019, 12:23 PM

## 2019-05-18 NOTE — Evaluation (Signed)
Occupational Therapy Assessment and Plan  Patient Details  Name: Tyrone Washington MRN: [id number] Date of Birth: 03-21-1955  OT Diagnosis: ataxia and muscle weakness (generalized) Rehab Potential: Rehab Potential (ACUTE ONLY): Excellent ELOS: 7-10 days   Today's Date: 05/18/2019 OT Individual Time: 0900-1000 OT Individual Time Calculation (min): 60 min     Problem List:  Patient Active Problem List   Diagnosis Date Noted  . Myelopathy ([place]) 05/17/2019  . 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 B12 deficiency 04/10/2019  . 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 Preventative health care 07/16/2015  . Nonspecific abnormal electrocardiogram (ECG) (EKG) 07/16/2015  . Migraine headache 11/10/2013  . WEIGHT LOSS 05/27/2010  . Goiter 01/14/2009  . 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 GENITAL HERPES 12/15/2008  . Hyperlipidemia 12/15/2008  . ALLERGIC RHINITIS 12/15/2008  . Neck mass 12/15/2008    Past Medical History:  Past Medical History:  Diagnosis Date  . Allergy    allergic rhinitis  . Arthritis   . B12 deficiency 04/10/2019  . History of chicken pox   . Hyperlipidemia    no meds now   Past Surgical History:  Past Surgical History:  Procedure Laterality Date  . COLONOSCOPY    . LAMINECTOMY N/A 05/13/2019   Procedure: THORACIC TEN LAMINECTOMY FOR TUMOR;  Surgeon: Earnie Larsson, MD;  Location: [facility];  Service: Neurosurgery;  Laterality: N/A;  . 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 Per chart review patient lives alone 1 level home with a few steps to entry. Independent with assistive device however patient had reported multiple falls. Patient presented 05/13/2019 with 75-month history of progressive lower extremity weakness and sensory loss. He denied any bowel or bladder disturbances. Admission chemistries with hemoglobin of 12, chemistries unremarkable, urinalysis negative nitrite, SARS coronavirus negative. 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 Vertebral body and right sided articular masses however were spared. He does have some metastatic disease thoracic spine T7 level that was noncompressive as well as in the sacrum. Staging CT scan obtained March 2 included CT chest abdomen pelvis showing multiple small pulmonary nodules scattered in the lungs bilaterally with the largest of which measuring 9 mm in the left lower lobe and 11 mm in the right lower lobe. He was noted to have lytic lesions in the thoracic spine including T7 and T10. Multiple segmental and subsegmental filling defect in the pulmonary arteries compatible with pulmonary embolism. 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 PAnnette Stable Plan is to begin Xarelto for pulmonary emboli postop day 10 beginning 05/23/2019. Oncology services consulted Dr. SAlen Blew3/3/2021and radiation Oncologyfor advanced malignancies with chart reviewed recommendations were for follow-up cancer center on discharge with pathology report pending. Patient currently remains on Decadron protocol. Acute blood loss anemia 8.0.Therapies have evaluated and are recommending CIR. Pt is to admit to CIR on 05/17/19.   Patient currently requires min with basic self-care skills secondary to muscle weakness, decreased cardiorespiratoy endurance, ataxia and decreased coordination and decreased standing  balance.  Prior to hospitalization, patient could complete  BADLs with independent .  Patient will benefit from skilled intervention to increase independence with basic self-care skills prior to discharge home with intermittent assist from sister.  Anticipate patient will require intermittent supervision and follow up home health.  OT - End of Session Endurance Deficit: Yes OT Assessment Rehab Potential (ACUTE ONLY): Excellent OT Barriers to Discharge: Pending chemo/radiation;Lack of/limited family support;Medical stability OT Patient demonstrates impairments in the following area(s): Balance;Safety;Sensory;Endurance;Motor OT Basic ADL's Functional Problem(s): Grooming;Bathing;Dressing;Toileting OT Advanced ADL's Functional Problem(s): Simple Meal Preparation;Laundry OT Transfers Functional Problem(s): Toilet;Tub/Shower OT Additional Impairment(s): None OT Plan OT Intensity: Minimum of 1-2 x/day, 45 to 90 minutes OT Frequency: 5 out of 7 days OT Duration/Estimated Length of Stay: 7-10 days OT Treatment/Interventions: Balance/vestibular training;Discharge planning;Pain management;Self Care/advanced ADL retraining;Therapeutic Activities;UE/LE Coordination activities;Visual/perceptual remediation/compensation;Therapeutic Exercise;Patient/family education;Skin care/wound managment;Functional mobility training;Disease mangement/prevention;Community reintegration;DME/adaptive equipment instruction;Neuromuscular re-education;Psychosocial support;UE/LE Strength taining/ROM OT Self Feeding Anticipated Outcome(s): No goal OT Basic Self-Care Anticipated Outcome(s): Mod I OT Toileting Anticipated Outcome(s): Mod I OT Bathroom Transfers Anticipated Outcome(s): Mod I OT Recommendation Patient destination: Home Follow Up Recommendations: Home health OT Equipment Recommended: Tub/shower bench;To be determined Skilled Therapeutic Intervention Skilled OT session completed with focus on initial evaluation, education on OT role/POC, and establishment of  patient-centered goals.   Pt greeted in bed with no c/o pain. Already washed up this AM with nursing staff. He simulated bathing completion EOB at sit<stand level without device, Min A for dynamic standing balance at this time. Pt able to doff gripper socks and don new ones while EOB, 1 posterior LOB with pt able to self correct. He also donned sneakers over gripper socks and fastened laces unassisted. Setup for donning overhead shirt. Next worked on functional ambulation with and without RW to toilet and shower. Pt required Min A and min guard respectively. Worked on LE coordination due to ataxia and narrow base of support, pt very receptive to education with technique improving as session progressed. He ambulated to the tub shower room using RW with Steady-Min A, pt with very slow pace as he was concentrating on LE movement. Noted increased hyperextension Lt knee>Rt knee as fatigue set in. Discussed TTB for home use with pt practicing TTB transfers with and without RW. He was motivated to ambulate back to room after a short break, the same assistance required for ambulation back to room. Taught him how to complete mini squats using RW with education emphasis placed on holding squatted position in midrange to improve knee strength/control. Left him with all needs within reach and bed alarm set.    OT Evaluation Precautions/Restrictions  Precautions Precautions: Fall Vital Signs Therapy Vitals Temp: 97.6 F (36.4 C) Temp Source: Oral Pulse Rate: 91 Resp: 18 BP: (!) 103/58 Patient Position (if appropriate): Lying Oxygen Therapy SpO2: 100 % O2 Device: Room Air Pain: pt denied pain during tx    Home Living/Prior Functioning Home Living Available Help at Discharge: Available PRN/intermittently(sister) Type of Home: House Home Access: Stairs to enter [employer] of Steps: 2-3 steps Entrance Stairs-Rails: Right Home Layout: One level Bathroom Shower/Tub: Archivist: Yes  Lives With: Alone IADL History Homemaking Responsibilities: Yes(Pt independent with IADLs prior to functional decline) Type of Occupation: Worked at [employer] prior to functional decline Leisure and Hobbies: Used to Leisure centre manager for sports events Prior Function Level of Independence: Independent with gait, Independent with transfers, Requires assistive device for independence, Independent with basic ADLs, Independent with  homemaking with ambulation(Pt reported he used a rollator or SPC for mobility around October of last year)  Able to Take Stairs?: Yes Driving: Yes Comments: mutliple falls ADL ADL Eating: Not assessed Grooming: Setup Where Assessed-Grooming: Edge of bed Upper Body Bathing: Setup Where Assessed-Upper Body Bathing: Edge of bed Lower Body Bathing: Contact guard Where Assessed-Lower Body Bathing: Edge of bed Upper Body Dressing: Setup Where Assessed-Upper Body Dressing: Edge of bed Lower Body Dressing: Contact guard Where Assessed-Lower Body Dressing: Edge of bed Toileting: Not assessed Toilet Transfer: Minimal assistance Toilet Transfer Method: Ambulating(without device) Tub/Shower Transfer: Minimal assistance Tub/Shower Transfer Method: Ambulating(without device) Social research officer, government: (without device) Social research officer, government Method: Ambulating ADL Comments: Bathing tasks were simulated during session Vision Baseline Vision/History: Wears glasses Wears Glasses: Reading only Patient Visual Report: No change from baseline Perception  Perception: Within Functional Limits Praxis Praxis: Intact Cognition Overall Cognitive Status: Within Functional Limits for tasks assessed Arousal/Alertness: Awake/alert Orientation Level: Person;Place;Situation Person: Oriented Place: Oriented Situation: Oriented Year: 2021 Month: March Day of Week: Correct Memory: Appears intact Immediate Memory Recall: Sock;Blue;Bed Memory  Recall Sock: Without Cue Memory Recall Blue: Without Cue Memory Recall Bed: Without Cue Awareness: Appears intact Problem Solving: Appears intact Safety/Judgment: Appears intact Sensation Sensation Light Touch: Impaired Detail Light Touch Impaired Details: Impaired RLE;Impaired LLE Proprioception: Appears Intact;Impaired Detail Proprioception Impaired Details: Impaired RLE;Impaired LLE Coordination Gross Motor Movements are Fluid and Coordinated: No Fine Motor Movements are Fluid and Coordinated: Yes Coordination and Movement Description: Ataxic, functional ambulation also affected by sensation deficits in feet Finger Nose Finger Test: WNL bilaterally Motor  Motor Motor: Ataxia Mobility  Bed Mobility Bed Mobility: Supine to Sit;Sit to Supine Supine to Sit: Supervision/Verbal cueing Sit to Supine: Supervision/Verbal cueing Transfers Sit to Stand: Contact Guard/Touching assist Stand to Sit: Contact Guard/Touching assist  Trunk/Postural Assessment  Cervical Assessment Cervical Assessment: Exceptions to [facility] Thoracic Assessment Thoracic Assessment: Exceptions to [facility] Lumbar Assessment Lumbar Assessment: Exceptions to [facility] Postural Control Postural Control: Within Functional Limits  Balance Balance Balance Assessed: Yes Static Sitting Balance Static Sitting - Balance Support: Feet supported;Bilateral upper extremity supported Static Sitting - Level of Assistance: 5: Stand by assistance Dynamic Sitting Balance Dynamic Sitting - Level of Assistance: 5: Stand by assistance(donning gripper socks EOB) Static Standing Balance Static Standing - Balance Support: During functional activity Static Standing - Level of Assistance: 4: Min assist Dynamic Standing Balance Dynamic Standing - Balance Support: During functional activity(toilet transfer without device) Dynamic Standing - Level of Assistance: 4: Min assist Extremity/Trunk Assessment RUE Assessment RUE Assessment: Within  Functional Limits Active Range of Motion (AROM) Comments: WNL LUE Assessment LUE Assessment: Within Functional Limits Active Range of Motion (AROM) Comments: WNL   Refer to Care Plan for Long Term Goals  Recommendations for other services: None    Discharge Criteria: Patient will be discharged from OT if patient refuses treatment 3 consecutive times without medical reason, if treatment goals not met, if there is a change in medical status, if patient makes no progress towards goals or if patient is discharged from hospital.  The above assessment, treatment plan, treatment alternatives and goals were discussed and mutually agreed upon: by patient  Skeet Simmer 05/18/2019, 4:24 PM

## 2019-05-19 ENCOUNTER — Inpatient Hospital Stay (HOSPITAL_COMMUNITY): Payer: BC Managed Care – PPO | Admitting: Occupational Therapy

## 2019-05-19 ENCOUNTER — Inpatient Hospital Stay (HOSPITAL_COMMUNITY): Payer: BC Managed Care – PPO | Admitting: Physical Therapy

## 2019-05-19 DIAGNOSIS — D72829 Elevated white blood cell count, unspecified: Secondary | ICD-10-CM

## 2019-05-19 DIAGNOSIS — D72828 Other elevated white blood cell count: Secondary | ICD-10-CM

## 2019-05-19 DIAGNOSIS — E8809 Other disorders of plasma-protein metabolism, not elsewhere classified: Secondary | ICD-10-CM

## 2019-05-19 DIAGNOSIS — T380X5A Adverse effect of glucocorticoids and synthetic analogues, initial encounter: Secondary | ICD-10-CM

## 2019-05-19 DIAGNOSIS — D62 Acute posthemorrhagic anemia: Secondary | ICD-10-CM

## 2019-05-19 DIAGNOSIS — R739 Hyperglycemia, unspecified: Secondary | ICD-10-CM

## 2019-05-19 DIAGNOSIS — E46 Unspecified protein-calorie malnutrition: Secondary | ICD-10-CM

## 2019-05-19 LAB — CBC WITH DIFFERENTIAL/PLATELET
Abs Immature Granulocytes: 2.54 10*3/uL — ABNORMAL HIGH (ref 0.00–0.07)
Basophils Absolute: 0.2 10*3/uL — ABNORMAL HIGH (ref 0.0–0.1)
Basophils Relative: 1 %
Eosinophils Absolute: 0 10*3/uL (ref 0.0–0.5)
Eosinophils Relative: 0 %
HCT: 24.3 % — ABNORMAL LOW (ref 39.0–52.0)
Hemoglobin: 7.8 g/dL — ABNORMAL LOW (ref 13.0–17.0)
Immature Granulocytes: 11 %
Lymphocytes Relative: 9 %
Lymphs Abs: 2.1 10*3/uL (ref 0.7–4.0)
MCH: 30 pg (ref 26.0–34.0)
MCHC: 32.1 g/dL (ref 30.0–36.0)
MCV: 93.5 fL (ref 80.0–100.0)
Monocytes Absolute: 1 10*3/uL (ref 0.1–1.0)
Monocytes Relative: 5 %
Neutro Abs: 17 10*3/uL — ABNORMAL HIGH (ref 1.7–7.7)
Neutrophils Relative %: 74 %
Platelets: 311 10*3/uL (ref 150–400)
RBC: 2.6 MIL/uL — ABNORMAL LOW (ref 4.22–5.81)
RDW: 15.4 % (ref 11.5–15.5)
WBC: 22.8 10*3/uL — ABNORMAL HIGH (ref 4.0–10.5)
nRBC: 2.4 % — ABNORMAL HIGH (ref 0.0–0.2)

## 2019-05-19 LAB — COMPREHENSIVE METABOLIC PANEL
ALT: 14 U/L (ref 0–44)
AST: 16 U/L (ref 15–41)
Albumin: 2.3 g/dL — ABNORMAL LOW (ref 3.5–5.0)
Alkaline Phosphatase: 38 U/L (ref 38–126)
Anion gap: 7 (ref 5–15)
BUN: 17 mg/dL (ref 8–23)
CO2: 26 mmol/L (ref 22–32)
Calcium: 8.2 mg/dL — ABNORMAL LOW (ref 8.9–10.3)
Chloride: 105 mmol/L (ref 98–111)
Creatinine, Ser: 0.98 mg/dL (ref 0.61–1.24)
GFR calc Af Amer: >60 mL/min (ref >60)
GFR calc non Af Amer: >60 mL/min (ref >60)
Glucose, Bld: 113 mg/dL — ABNORMAL HIGH (ref 70–99)
Potassium: 4 mmol/L (ref 3.5–5.1)
Sodium: 138 mmol/L (ref 135–145)
Total Bilirubin: 0.6 mg/dL (ref 0.3–1.2)
Total Protein: 4.8 g/dL — ABNORMAL LOW (ref 6.5–8.1)

## 2019-05-19 LAB — SURGICAL PATHOLOGY

## 2019-05-19 MED ORDER — PRO-STAT SUGAR FREE PO LIQD
30.0000 mL | Freq: Two times a day (BID) | ORAL | Status: DC
  Administered 2019-05-19 – 2019-05-27 (×17): 30 mL via ORAL
  Filled 2019-05-19 (×16): qty 30

## 2019-05-19 MED ORDER — DEXAMETHASONE 4 MG PO TABS
6.0000 mg | ORAL_TABLET | Freq: Four times a day (QID) | ORAL | Status: DC
  Administered 2019-05-19 – 2019-05-22 (×13): 6 mg via ORAL
  Filled 2019-05-19 (×13): qty 1

## 2019-05-19 NOTE — Progress Notes (Signed)
[place] for Xarelto  Indication: pulmonary embolus  No Known Allergies  Patient Measurements:    Vital Signs: Temp: 98.4 F (36.9 C) (03/08 0647) Temp Source: Oral (03/08 0609) BP: 106/72 (03/08 0647) Pulse Rate: 76 (03/08 0647)  Labs: Recent Labs    05/18/19 1338 05/19/19 0539 05/19/19 0708  HGB 7.4*  --  7.8*  HCT 22.9*  --  24.3*  PLT 277  --  311  CREATININE 1.05 0.98  --     Estimated Creatinine Clearance: 76.6 mL/min (by C-G formula based on SCr of 0.98 mg/dL).  Assessment: 64 year old male who recently underwent T9-10 laminectomy for epidural tumor. PE was found on imaging workup. Plan is to start anticoagulation with Xarelto for PE on post-op day #10 which is 3/12 per neurosurgery.   Goal of Therapy:  Monitor platelets by anticoagulation protocol: Yes   Plan:  Xarelto 15 mg PO bid x 21 days then 20 mg daily with supper -Starting 3/12 Educate prior to discharge  Thank you for involving pharmacy in this patient's care.  Renold Genta, PharmD, BCPS Clinical Pharmacist Clinical phone for 05/19/2019 until 3p is [phone] 05/19/2019 12:51 PM  **Pharmacist phone directory can be found on [email or web address] listed under [place]**

## 2019-05-19 NOTE — Progress Notes (Signed)
I called and spoke with Tyrone Washington about his pathology and that we would plan to move forward with radiation planning next week. He will follow up with Dr. Alen Blew as well to determine next steps for systemic therapy.

## 2019-05-19 NOTE — Progress Notes (Signed)
Tyrone Washington   Subjective/Complaints: Patient seen laying in bed this morning.  Tyrone Washington states Tyrone Washington slept well overnight.  Tyrone Washington states Tyrone Washington had a good first day of therapies yesterday.  Tyrone Washington states Tyrone Washington wants to get ready for the day today.  ROS: Denies CP, SOB, N/V/D  Objective:   No results found. Recent Labs    05/18/19 1338 05/19/19 0708  WBC 18.8* 22.8*  HGB 7.4* 7.8*  HCT 22.9* 24.3*  PLT 277 311   Recent Labs    05/18/19 1338 05/19/19 0539  NA 138 138  K 3.7 4.0  CL 104 105  CO2 24 26  GLUCOSE 139* 113*  BUN 18 17  CREATININE 1.05 0.98  CALCIUM 8.1* 8.2*    Intake/Output Summary (Last 24 hours) at 05/19/2019 1141 Last data filed at 05/19/2019 0815 Gross per 24 hour  Intake 660 ml  Output 1600 ml  Net -940 ml     Physical Exam: Vital Signs Blood pressure 106/72, pulse 76, temperature 98.4 F (36.9 C), resp. rate 16, SpO2 100 %. Constitutional: No distress . Vital signs reviewed. HENT: Atraumatic.  Large neck mass Eyes: EOMI. No discharge. Cardiovascular: No JVD. Respiratory: Normal effort.  No stridor. GI: Non-distended. Skin: Warm and dry.  Intact. Psych: Normal mood.  Normal behavior. Musc: No edema in extremities.  No tenderness in extremities. Neuro:  Alert and oriented Motor: 4+/5 throughout   Assessment/Plan: 1. 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  which require 3+ hours per day of interdisciplinary therapy in a comprehensive inpatient rehab setting.  Physiatrist is providing close team supervision and 24 hour management of active medical problems listed below.  Physiatrist and rehab team continue to assess barriers to discharge/monitor patient progress toward functional and medical goals  Care Tool:  Bathing    Body parts bathed by patient: Left upper leg, Right  lower leg, Left lower leg, Face, Right upper leg, Buttocks, Front perineal area, Abdomen, Chest, Left arm, Right arm         Bathing assist Assist Level: Contact Guard/Touching assist     Upper Body Dressing/Undressing Upper body dressing   What is the patient wearing?: Pull over shirt    Upper body assist Assist Level: Set up assist    Lower Body Dressing/Undressing Lower body dressing      What is the patient wearing?: Pants     Lower body assist Assist for lower body dressing: Contact Guard/Touching assist     Toileting Toileting Toileting Activity did not occur (Clothing management and hygiene only): N/A (no void or bm)  Toileting assist Assist for toileting: Contact Guard/Touching assist(cane and grab bar utilized) Assistive Device Comment: urinal   Transfers Chair/bed transfer  Transfers assist  Chair/bed transfer activity did not occur: Safety/medical concerns  Chair/bed transfer assist level: Contact Guard/Touching assist     Locomotion Ambulation   Ambulation assist      Assist level: Contact Guard/Touching assist Assistive device: Cane-straight Max distance: 150'   Walk 10 feet activity   Assist     Assist level: Contact Guard/Touching assist Assistive device: Cane-straight   Walk 50 feet activity   Assist    Assist level: Contact Guard/Touching assist Assistive device: Cane-straight    Walk 150 feet activity   Assist    Assist level: Contact Guard/Touching assist Assistive device: Cane-straight    Walk 10 feet on uneven  surface  activity   Assist           Wheelchair     Assist Will patient use wheelchair at discharge?: No             Wheelchair 50 feet with 2 turns activity    Assist            Wheelchair 150 feet activity     Assist          Blood pressure 106/72, pulse 76, temperature 98.4 F (36.9 C), resp. rate 16, SpO2 100 %.  Medical Problem List and Plan: 1.  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  Oncology.Radiation Oncology services follow-up  Continue CR 2. -DVT/anticoagulation. CT 3/2 showed multiple small pulmonary nodules scattered in lungs bilaterally and multiple segmental and subsegmental filling defect in pulmonary arteries compatible with PE.   Plan is to begin Xarelto 05/23/2019 and was discussed with neurosurgery Dr. Trenton Gammon             -antiplatelet therapy: N/A 3. Pain Management: Oxycodone as needed as well as Valium as needed for muscle spasms.   Controlled on 3/8 4. Mood: Provide emotional support             -antipsychotic agents: N/A 5. Neuropsych: This patient is capable of making decisions on his own behalf. 6. Skin/Wound Care: Routine skin checks 7. Fluids/Electrolytes/Nutrition: Routine in and outs  8.  Acute blood loss anemia.    Hemoglobin 7.8 on 3/8  Continue to monitor 9.  Constipation.  Laxative assistance 10.  Tobacco abuse.  Counseling 11.  Steroid-induced hyperglycemia  Continue to monitor 12.  Hypoalbuminemia  Supplement initiated on 3/8 13.  Leukocytosis-likely steroid-induced  Afebrile  WBCs 22.8 on 3/8  Continue to monitor  LOS: 2 days A FACE TO FACE EVALUATION WAS PERFORMED  Tyrone Washington Tyrone Washington 05/19/2019, 11:41 AM

## 2019-05-19 NOTE — Progress Notes (Signed)
Physical Therapy Session Note  Patient Details  Name: Tyrone Washington MRN: [id number] Date of Birth: Apr 08, 1955  Today's Date: 05/19/2019 PT Individual Time: [id number] PT Individual Time Calculation (min): 70 min   Short Term Goals: Week 1:  PT Short Term Goal 1 (Week 1): STGs = LTGs  Skilled Therapeutic Interventions/Progress Updates:   Pt received toileting and agreeable to therapy, denies pain. Finished toileting and performed LE garment management and washed hands at sink in standing w/ CGA. Ambulated to/from therapy gym and around unit w/ CGA using SPC, >150' at a time. Pt w/ slow gait pattern but maintaining gait pattern w/ SPC well. Performed Berg Balance Scale as detailed below and explained significance of results to pt. Discussed importance of AD use for safety. Worked on BLE strengthening and balance strategies w/ blocked practice of sit<>stands w/o UE assist. Both on firm and foam surfaces. Performed w/ CGA and verbal cues for slow and controlled movements. Ambulated to day room and performed NuStep 5 min and 3 min @ level 3 w/ all extremities to work on endurance and global strengthening. Discussed d/c plan and importance of having assistance in the future w/ getting to/from chemo appointments 2/2 chemo-related fatigue that is possible. Pt reports he believes his sister will be able to help out with this. Ambulated back to room and ended session in supine, all needs in reach.   Therapy Documentation Precautions:  Precautions Precautions: Fall Restrictions Weight Bearing Restrictions: No Pain: Pain Assessment Pain Scale: Faces Pain Score: 0-No pain Balance: Standardized Balance Assessment Standardized Balance Assessment: Berg Balance Test Berg Balance Test Sit to Stand: Able to stand without using hands and stabilize independently Standing Unsupported: Able to stand 2 minutes with supervision Sitting with Back Unsupported but Feet Supported on Floor or Stool: Able to sit 2  minutes under supervision Stand to Sit: Sits safely with minimal use of hands Transfers: Able to transfer safely, minor use of hands Standing Unsupported with Eyes Closed: Able to stand 10 seconds with supervision Standing Ubsupported with Feet Together: Needs help to attain position but able to stand for 30 seconds with feet together From Standing, Reach Forward with Outstretched Arm: Can reach forward >12 cm safely (5") From Standing Position, Pick up Object from Floor: Able to pick up shoe, needs supervision From Standing Position, Turn to Look Behind Over each Shoulder: Turn sideways only but maintains balance Turn 360 Degrees: Needs close supervision or verbal cueing Standing Unsupported, Alternately Place Feet on Step/Stool: Able to complete >2 steps/needs minimal assist Standing Unsupported, One Foot in Front: Able to take small step independently and hold 30 seconds Standing on One Leg: Unable to try or needs assist to prevent fall Total Score: 34  Therapy/Group: Individual Therapy  Marnita Poirier Clent Demark 05/19/2019, 12:07 PM

## 2019-05-19 NOTE — Progress Notes (Signed)
Patient information reviewed and entered into [facility] by Becky Jena Tegeler, PPS coordinator. Information including medical coding, function ability, and quality indicators will be reviewed and updated through discharge.   

## 2019-05-19 NOTE — Progress Notes (Signed)
Occupational Therapy Session Note  Patient Details  Name: Tyrone Washington MRN: [id number] Date of Birth: 1955-08-03  Today's Date: 05/19/2019 OT Individual Time: 0904-1000 OT Individual Time Calculation (min): 56 min    Short Term Goals: Week 1:  OT Short Term Goal 1 (Week 1): STGs=LTGs due to ELOS  Skilled Therapeutic Interventions/Progress Updates:    Pt in bed to start session, agreeable to ADL session.  He was able to transfer to sitting with supervision and then use the single point cane for mobility to gather clothing with min guard assist.  He also completed toilet transfer and shower transfer at the same level with use of the cane as well.  He completed all bathing sit to stand with min guard assist and use of the grab bar on the right side for some slight support in standing.  He also stood to dry off with use of the bar for support as well.  Grooming tasks were completed in standing at the sink with supervision for oral care as well as for washing his face.  He was able to complete dressing sit to stand from the EOB with overall supervision including donning his gripper socks and shoes.  Finished session with functional mobility with use of the [facility] out around the nurses station and back with min guard assist and min instructional cueing for upright posture as pt demonstrates some trunk flexion.  He was left in the bed to rest with call button and phone in reach and safety alarm in place.    Therapy Documentation Precautions:  Precautions Precautions: Fall Restrictions Weight Bearing Restrictions: No   Pain: Pain Assessment Pain Scale: Faces Pain Score: 0-No pain ADL: See Care Tool Section for some details of mobility and selfcare  Therapy/Group: Individual Therapy  Tyrone Washington OTR/L  05/19/2019, 10:54 AM

## 2019-05-19 NOTE — Progress Notes (Signed)
Occupational Therapy Session Note  Patient Details  Name: Tyrone Washington MRN: [id number] Date of Birth: [birth year]  Today's Date: 05/19/2019 OT Individual Time: 1400-1511 OT Individual Time Calculation (min): 71 min   Short Term Goals: Week 1:  OT Short Term Goal 1 (Week 1): STGs=LTGs due to ELOS  Skilled Therapeutic Interventions/Progress Updates:    Pt greeted in bed with no c/o pain and ADL needs met. Agreeable to leave the unit for tx. He donned a hoodie with setup and completed stand pivot<w/c with CGA using SPC. Escorted pt to the atrium where he ambulated in in the food court, gift shop, and outdoor patio. Tx focus was placed on functional ambulation in community setting, walking around natural obstacles and over uneven terrain. Pt required Min A for ambulation using SPC, vcs for forward gaze and incorporating natural arm swing. Pt was very focused on coordinating his steps to improve ataxia. He transferred to 2 low seats without armrests and also to an outdoor bench with CGA. Pt needed vcs to back all the way up to transfer surface before initiating sit. At end of session pt was escorted back to the room. CGA for stand pivot<bed using device. Left him with all needs within reach.   Therapy Documentation Precautions:  Precautions Precautions: Fall Restrictions Weight Bearing Restrictions: No Vital Signs: Therapy Vitals Temp: 97.7 F (36.5 C) Pulse Rate: 83 Resp: 17 BP: 108/70 Patient Position (if appropriate): Sitting Oxygen Therapy SpO2: 100 % O2 Device: Room Air ADL: ADL Eating: Not assessed Grooming: Setup Where Assessed-Grooming: Edge of bed Upper Body Bathing: Setup Where Assessed-Upper Body Bathing: Edge of bed Lower Body Bathing: Contact guard Where Assessed-Lower Body Bathing: Edge of bed Upper Body Dressing: Setup Where Assessed-Upper Body Dressing: Edge of bed Lower Body Dressing: Contact guard Where Assessed-Lower Body Dressing: Edge of bed Toileting:  Not assessed Toilet Transfer: Minimal assistance Toilet Transfer Method: Ambulating(without device) Tub/Shower Transfer: Minimal assistance Tub/Shower Transfer Method: Ambulating(without device) Social research officer, government: (without device) Social research officer, government Method: Ambulating ADL Comments: Bathing tasks were simulated during session :     Therapy/Group: Individual Therapy  Autumne Kallio A Eduardo Wurth 05/19/2019, 3:49 PM

## 2019-05-19 NOTE — Plan of Care (Signed)
  Problem: Consults Goal: RH SPINAL CORD INJURY PATIENT EDUCATION Description:  See Patient Education module for education specifics.  Outcome: Progressing Goal: Skin Care Protocol Initiated - if Braden Score 18 or less Description: If consults are not indicated, leave blank or document N/A Outcome: Progressing Goal: Nutrition Consult-if indicated Outcome: Progressing   Problem: SCI BOWEL ELIMINATION Goal: RH STG MANAGE BOWEL WITH ASSISTANCE Description: STG Manage Bowel with Assistance. Outcome: Progressing Goal: RH STG SCI MANAGE BOWEL WITH MEDICATION WITH ASSISTANCE Description: STG SCI Manage bowel with medication with assistance. Outcome: Progressing   Problem: RH SKIN INTEGRITY Goal: RH STG SKIN FREE OF INFECTION/BREAKDOWN Outcome: Progressing Goal: RH STG MAINTAIN SKIN INTEGRITY WITH ASSISTANCE Description: STG Maintain Skin Integrity With Assistance. Outcome: Progressing Goal: RH STG ABLE TO PERFORM INCISION/WOUND CARE W/ASSISTANCE Description: STG Able To Perform Incision/Wound Care With Assistance. Outcome: Progressing   Problem: RH SAFETY Goal: RH STG ADHERE TO SAFETY PRECAUTIONS W/ASSISTANCE/DEVICE Description: STG Adhere to Safety Precautions With Assistance/Device. Outcome: Progressing Goal: RH STG DECREASED RISK OF FALL WITH ASSISTANCE Description: STG Decreased Risk of Fall With Assistance. Outcome: Progressing   Problem: RH PAIN MANAGEMENT Goal: RH STG PAIN MANAGED AT OR BELOW PT'S PAIN GOAL Outcome: Progressing   Problem: RH KNOWLEDGE DEFICIT SCI Goal: RH STG INCREASE KNOWLEDGE OF SELF CARE AFTER SCI Outcome: Progressing

## 2019-05-19 NOTE — Care Management (Signed)
Inpatient [facility] Individual Statement of Services  Patient Name:  Tyrone Washington  Date:  05/19/2019  Welcome to the [facility].  Our goal is to provide you with an individualized program based on your diagnosis and situation, designed to meet your specific needs.  With this comprehensive rehabilitation program, you will be expected to participate in at least 3 hours of rehabilitation therapies Monday-Friday, with modified therapy programming on the weekends.  Your rehabilitation program will include the following services:  Physical Therapy (PT), Occupational Therapy (OT), Speech Therapy (ST), 24 hour per day rehabilitation nursing, Therapeutic Recreaction (TR), Psychology, Neuropsychology, Case Management (Social Worker), Rehabilitation Medicine, Nutrition Services, Pharmacy Services and Other  Weekly team conferences will be held on Tuesdays to discuss your progress.  Your Social Worker will talk with you frequently to get your input and to update you on team discussions.  Team conferences with you and your family in attendance may also be held.  Expected length of stay: 7-10 days   Overall anticipated outcome: Independent  Depending on your progress and recovery, your program may change. Your Social Worker will coordinate services and will keep you informed of any changes. Your Social Worker's name and contact numbers are listed  below.  The following services may also be recommended but are not provided by the [facility] will be made to provide these services after discharge if needed.  Arrangements include referral to agencies that provide these services.  Your insurance has been verified to be:  [employer] [place]  Your primary doctor is:  [name]  Pertinent information will be  shared with your doctor and your insurance company.  Social Worker:  Loralee Pacas, LCSWA  Information discussed with and copy given to patient by: Rana Snare, 05/19/2019, 9:08 AM

## 2019-05-19 NOTE — Progress Notes (Signed)
Social Work Assessment and Plan   Patient Details  Name: Tyrone Washington MRN: [id number] Date of Birth: 09/01/1955  Today's Date: 05/19/2019  Problem List:  Patient Active Problem List   Diagnosis Date Noted  . Leucocytosis   . Hypoalbuminemia due to protein-calorie malnutrition ([place])   . Steroid-induced hyperglycemia   . Acute blood loss anemia   . Myelopathy ([place]) 05/17/2019  . 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 B12 deficiency 04/10/2019  . 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 Preventative health care 07/16/2015  . Nonspecific abnormal electrocardiogram (ECG) (EKG) 07/16/2015  . Migraine headache 11/10/2013  . WEIGHT LOSS 05/27/2010  . Goiter 01/14/2009  . 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 GENITAL HERPES 12/15/2008  . Hyperlipidemia 12/15/2008  . ALLERGIC RHINITIS 12/15/2008  . Neck mass 12/15/2008   Past Medical History:  Past Medical History:  Diagnosis Date  . Allergy    allergic rhinitis  . Arthritis   . B12 deficiency 04/10/2019  . History of chicken pox   . Hyperlipidemia    no meds now   Past Surgical History:  Past Surgical History:  Procedure Laterality Date  . COLONOSCOPY    . LAMINECTOMY N/A 05/13/2019   Procedure: THORACIC TEN LAMINECTOMY FOR TUMOR;  Surgeon: Earnie Larsson, MD;  Location: [facility];  Service: Neurosurgery;  Laterality: N/A;  . THYROID SURGERY     "Years ago"   Social History:  reports that he has been smoking. He has never used smokeless tobacco. He reports current alcohol use of about 7.0 standard drinks of alcohol per week. He reports that he does not use drugs.  Family / Support Systems Marital Status: Single Patient Roles: Parent Spouse/Significant Other: N/A Children: 2 children. 1 son lives in [place], 1 dtr lives in [place] Other Supports: His sister Santiago Glad [phone] Anticipated Caregiver: Pt sister Santiago Glad to provide intermittent support where she can meal prep and check in on pt  1-2xs per day. Ability/Limitations of Caregiver: Caregiver works Careers adviser: Intermittent Family Dynamics: Pt lives alone.  Social History Preferred language: English Religion: Christian Cultural Background: Pt worked for [employer] in [place] for 8 yrs and for [employer] for 15 yrs. 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 Recent Psychosocial Issues: Denies Psychiatric History: Denies Substance Abuse History: Denies. Pt states he has never smoked cigarettes. Pt admits to smoking cigars atleast 1-2xs per day. Pt admits to EtOH use a couple times per week/1-2 beers per day. Denies recreational drug use.  Patient / Family Perceptions, Expectations & Goals Pt/Family understanding of illness & functional limitations: Pt has a general understanding of his condition. Premorbid pt/family roles/activities: Pt was independent with care needs; ADLs/IADLs Anticipated changes in roles/activities/participation: Pt will require assistance to/from medical appointments, meal prep, etc. Pt/family expectations/goals: Pt goal is "getting mobility back and be independent." Pt has a desire to drive again.  Community Resources CExpress Scripts None Premorbid Home Care/DME Agencies: None Transportation available at discharge: Sister to transport home Resource referrals recommended: Neuropsychology  Discharge Planning Living Arrangements: Alone Support Systems: Other relatives Type of Residence: Private residence Insurance Resources: PMultimedia programmer (specify), Medicare(BCBS) FMuseum/gallery curatorResources: [place]  Referred: No Money Management: Patient Does the patient have any problems obtaining your medications?: No Home Management: Pt managed care needs Patient/Family Preliminary Plans: Pt will need assistance with som ADLs [place] Barriers to Discharge: Decreased caregiver support, Lack of/limited family support Social Work Anticipated Follow Up Needs: HH/OP Expected length of stay: 7-10 days  Clinical Impression SW met with pt in room to introduce self, explain role, and discuss discharge process. DME: cane; here is in hospital.   Auria A Chamberlain 05/19/2019, 4:26 PM   

## 2019-05-20 ENCOUNTER — Inpatient Hospital Stay (HOSPITAL_COMMUNITY): Payer: BC Managed Care – PPO | Admitting: Occupational Therapy

## 2019-05-20 ENCOUNTER — Inpatient Hospital Stay (HOSPITAL_COMMUNITY): Payer: BC Managed Care – PPO | Admitting: Physical Therapy

## 2019-05-20 NOTE — Plan of Care (Signed)
  Problem: Consults Goal: RH SPINAL CORD INJURY PATIENT EDUCATION Description:  See Patient Education module for education specifics.  Outcome: Progressing Goal: Skin Care Protocol Initiated - if Braden Score 18 or less Description: If consults are not indicated, leave blank or document N/A Outcome: Progressing Goal: Nutrition Consult-if indicated Outcome: Progressing   Problem: SCI BOWEL ELIMINATION Goal: RH STG MANAGE BOWEL WITH ASSISTANCE Description: STG Manage Bowel with Assistance. Outcome: Progressing Goal: RH STG SCI MANAGE BOWEL WITH MEDICATION WITH ASSISTANCE Description: STG SCI Manage bowel with medication with assistance. Outcome: Progressing   Problem: RH SKIN INTEGRITY Goal: RH STG SKIN FREE OF INFECTION/BREAKDOWN Outcome: Progressing Goal: RH STG MAINTAIN SKIN INTEGRITY WITH ASSISTANCE Description: STG Maintain Skin Integrity With Assistance. Outcome: Progressing Goal: RH STG ABLE TO PERFORM INCISION/WOUND CARE W/ASSISTANCE Description: STG Able To Perform Incision/Wound Care With Assistance. Outcome: Progressing   Problem: RH SAFETY Goal: RH STG ADHERE TO SAFETY PRECAUTIONS W/ASSISTANCE/DEVICE Description: STG Adhere to Safety Precautions With Assistance/Device. Outcome: Progressing Goal: RH STG DECREASED RISK OF FALL WITH ASSISTANCE Description: STG Decreased Risk of Fall With Assistance. Outcome: Progressing   Problem: RH PAIN MANAGEMENT Goal: RH STG PAIN MANAGED AT OR BELOW PT'S PAIN GOAL Outcome: Progressing   Problem: RH KNOWLEDGE DEFICIT SCI Goal: RH STG INCREASE KNOWLEDGE OF SELF CARE AFTER SCI Outcome: Progressing

## 2019-05-20 NOTE — Patient Care Conference (Signed)
Inpatient RehabilitationTeam Conference and Plan of Care Update Date: 05/20/2019   Time: 10:05 AM    Patient Name: Tyrone Washington      Medical Record Number: [id number]  Date of Birth: May 26, 1955 Sex: Male         Room/Bed: 4W15C/4W15C-01 Payor Info: Payor: [place] / Plan: BCBS COMM PPO / Product Type: *No Product type* /    Admit Date/Time:  05/17/2019  4:26 PM  Primary Diagnosis:  Myelopathy ([place])  Patient Active Problem List   Diagnosis Date Noted  . Leucocytosis   . Hypoalbuminemia due to protein-calorie malnutrition ([place])   . Steroid-induced hyperglycemia   . Acute blood loss anemia   . Myelopathy ([place]) 05/17/2019  . 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 B12 deficiency 04/10/2019  . 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 Preventative health care 07/16/2015  . Nonspecific abnormal electrocardiogram (ECG) (EKG) 07/16/2015  . Migraine headache 11/10/2013  . WEIGHT LOSS 05/27/2010  . Goiter 01/14/2009  . 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 GENITAL HERPES 12/15/2008  . Hyperlipidemia 12/15/2008  . ALLERGIC RHINITIS 12/15/2008  . Neck mass 12/15/2008    Expected Discharge Date: Expected Discharge Date: 05/27/19  Team Members Present: Physician leading conference: Dr. Leeroy Cha Care Coodinator Present: Loralee Pacas, LCSWA;Genie Alyzabeth Pontillo, RN, MSN Nurse Present: Debroah Loop, RN PT Present: Burnard Bunting, PT OT Present: Willeen Cass, OT SLP Present: Weston Anna, SLP PPS Coordinator present : Ileana Ladd, Burna Mortimer, SLP     Current Status/Progress Goal Weekly Team Focus  Bowel/Bladder   continent bowel/bladder  maintain continence  QS/PRN assessment   Swallow/Nutrition/ Hydration             ADL's   Supervision for UB selfcare with min guard for LB selfcare sit to stand.  Functional transfers with min guard assist using the single point cane.  modified independent overall  selfcare  retraining, transfer training, balance retraining, DME education, therapeutic activities   Mobility   CGA w/ SPC, min assist w/o AD, 150'  mod/i, household gait  balance, endurance, BLE strengthening, education and discharge planning   Communication             Safety/Cognition/ Behavioral Observations            Pain   No evidence of pain per patient report, Q3H PRN oxycodone 10 for pain  Pain report <4 on 0-10 scale  QS/PRN assessment of patient pain   Skin   Closed incision mid back, no evidence of skin breakdown  remain free of skin breakdown, maintain skin integrity  PRN/QS assessment    Rehab Goals Patient on target to meet rehab goals: Yes *See Care Plan and progress notes for long and short-term goals.     Barriers to Discharge  Current Status/Progress Possible Resolutions Date Resolved   Nursing                  PT  Decreased caregiver support                 OT Pending chemo/radiation;Lack of/limited family support;Medical stability                SLP                SW Decreased caregiver support Caregiver works.            Discharge Planning/Teaching Needs:  D/c to home with intermittent support from sister  (can do  meal prep and check in 1-2xs per day). Pt states that he will find assistance for transportation to/from medical appointments.  Family education as recommended by therapy   Team Discussion: No pain, sleeping well.  RN BM yesterday, incision looks good.  OT min A overall, goals mod I.  PT CGA/close S with SPC, mod I household goals, outside S goals, balance impaired.   Revisions to Treatment Plan: N/A     Medical Summary Current Status: Medically stable, denies pain, having regular BM, sleeping well at night, hgb uptrending, leukocytosis uptrending Weekly Focus/Goal: Monitor luekocytosis, conitnue intensive therapies, nursing care, medical management  Barriers to Discharge: Medical stability   Possible Resolutions to Barriers: Monitor  luekocytosis, conitnue intensive therapies, nursing care, medical management, caregiver training   Continued Need for Acute Rehabilitation Level of Care: The patient requires daily medical management by a physician with specialized training in physical medicine and rehabilitation for the following reasons: Direction of a multidisciplinary physical rehabilitation program to maximize functional independence : Yes Medical management of patient stability for increased activity during participation in an intensive rehabilitation regime.: Yes Analysis of laboratory values and/or radiology reports with any subsequent need for medication adjustment and/or medical intervention. : Yes   I attest that I was present, lead the team conference, and concur with the assessment and plan of the team.   Retta Diones 05/21/2019, 12:13 PM   Team conference was held via web/ teleconference due to [place] - 19

## 2019-05-20 NOTE — Progress Notes (Signed)
Occupational Therapy Session Note  Patient Details  Name: Tyrone Washington MRN: [id number] Date of Birth: 08-Aug-1955  Today's Date: 05/20/2019 OT Individual Time: 1305-1350 OT Individual Time Calculation (min): 45 min    Short Term Goals: Week 1:  OT Short Term Goal 1 (Week 1): STGs=LTGs due to ELOS  Skilled Therapeutic Interventions/Progress Updates:    1:1 Pt participated in bathing at shower level. Focus on functional ambulation around the room and bathroom with SPC with min guard. Pt able to bathe sit to stand with distant supervision. Pt able to don clothing including socks and shoes with setup A and min guard for sit to stands. Performed oral care in standing with contact guard to supervision. Pt with wide base stance and very intentional stepping and forward lean. Pt left resting in the recliner.   Therapy Documentation Precautions:  Precautions Precautions: Fall Restrictions Weight Bearing Restrictions: No Pain:  no c/o pain in session   Therapy/Group: Individual Therapy  Willeen Cass [facility] 05/20/2019, 3:58 PM

## 2019-05-20 NOTE — Progress Notes (Signed)
[facility] PHYSICAL MEDICINE & REHABILITATION PROGRESS NOTE   Subjective/Complaints: Patient seen laying in bed this morning.  He states he slept well overnight.  He states he had a good first day of therapies yesterday.   Had BM yesterday. Denies pain.   ROS: Denies CP, SOB, N/V/D  Objective:   No results found. Recent Labs    05/18/19 1338 05/19/19 0708  WBC 18.8* 22.8*  HGB 7.4* 7.8*  HCT 22.9* 24.3*  PLT 277 311   Recent Labs    05/18/19 1338 05/19/19 0539  NA 138 138  K 3.7 4.0  CL 104 105  CO2 24 26  GLUCOSE 139* 113*  BUN 18 17  CREATININE 1.05 0.98  CALCIUM 8.1* 8.2*    Intake/Output Summary (Last 24 hours) at 05/20/2019 1013 Last data filed at 05/20/2019 0713 Gross per 24 hour  Intake 800 ml  Output 1450 ml  Net -650 ml     Physical Exam: Vital Signs Blood pressure 108/86, pulse 73, temperature 98 F (36.7 C), temperature source Oral, resp. rate 18, SpO2 100 %. Constitutional: No distress . Vital signs reviewed. Sitting up in bed with no complaints.  HENT: Atraumatic.  Large neck mass Eyes: EOMI. No discharge. Cardiovascular: No JVD. Respiratory: Normal effort.  No stridor. GI: Non-distended. Skin: Warm and dry.  Intact. Psych: Normal mood.  Normal behavior. Musc: No edema in extremities.  No tenderness in extremities. Neuro:  Alert and oriented Motor: 4+/5 throughout   Assessment/Plan: 1. 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  which require 3+ hours per day of interdisciplinary therapy in a comprehensive inpatient rehab setting.  Physiatrist is providing close team supervision and 24 hour management of active medical problems listed below.  Physiatrist and rehab team continue to assess barriers to discharge/monitor patient progress toward functional and medical goals  Care Tool:  Bathing    Body  parts bathed by patient: Left upper leg, Right lower leg, Left lower leg, Face, Right upper leg, Buttocks, Front perineal area, Abdomen, Chest, Left arm, Right arm         Bathing assist Assist Level: Contact Guard/Touching assist     Upper Body Dressing/Undressing Upper body dressing   What is the patient wearing?: Pull over shirt    Upper body assist Assist Level: Set up assist    Lower Body Dressing/Undressing Lower body dressing      What is the patient wearing?: Pants     Lower body assist Assist for lower body dressing: Contact Guard/Touching assist     Toileting Toileting Toileting Activity did not occur (Clothing management and hygiene only): N/A (no void or bm)  Toileting assist Assist for toileting: Independent Assistive Device Comment: urinal   Transfers Chair/bed transfer  Transfers assist  Chair/bed transfer activity did not occur: Safety/medical concerns  Chair/bed transfer assist level: Contact Guard/Touching assist     Locomotion Ambulation   Ambulation assist      Assist level: Contact Guard/Touching assist Assistive device: Cane-straight Max distance: 150'   Walk 10 feet activity   Assist     Assist level: Contact Guard/Touching assist Assistive device: Cane-straight   Walk 50 feet activity   Assist    Assist level: Contact Guard/Touching assist Assistive device: Cane-straight    Walk 150 feet activity   Assist    Assist level: Contact Guard/Touching assist Assistive device: Cane-straight    Walk 10 feet on  uneven surface  activity   Assist           Wheelchair     Assist Will patient use wheelchair at discharge?: No             Wheelchair 50 feet with 2 turns activity    Assist            Wheelchair 150 feet activity     Assist          Blood pressure 108/86, pulse 73, temperature 98 F (36.7 C), temperature source Oral, resp. rate 18, SpO2 100 %.  Medical Problem List and  Plan: 1.  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  Oncology.Radiation Oncology services follow-up  Continue CR  Supervision goals in community; has the support of sister and friend. Balance continues to be impaired.  2. -DVT/anticoagulation. CT 3/2 showed multiple small pulmonary nodules scattered in lungs bilaterally and multiple segmental and subsegmental filling defect in pulmonary arteries compatible with PE.   Plan is to begin Xarelto 05/23/2019 and was discussed with neurosurgery Dr. Trenton Gammon             -antiplatelet therapy: N/A 3. Pain Management: Oxycodone as needed as well as Valium as needed for muscle spasms.   Controlled on 3/8, 3/9.  4. Mood: Provide emotional support             -antipsychotic agents: N/A 5. Neuropsych: This patient is capable of making decisions on his own behalf. 6. Skin/Wound Care: Routine skin checks 7. Fluids/Electrolytes/Nutrition: Routine in and outs  8.  Acute blood loss anemia.    Hemoglobin 7.8 on 3/8  Continue to monitor 9.  Constipation.  Laxative assistance 10.  Tobacco abuse.  Counseling 11.  Steroid-induced hyperglycemia  Continue to monitor 12.  Hypoalbuminemia  Supplement initiated on 3/8 13.  Leukocytosis-likely steroid-induced  Afebrile  WBCs 22.8 on 3/8. Will repeat CBC tomorrow.   Continue to monitor 14. DC on Tuesday March 16th.   LOS: 3 days A FACE TO FACE EVALUATION WAS PERFORMED  Martha Clan P Hollister Wessler 05/20/2019, 10:13 AM

## 2019-05-20 NOTE — Progress Notes (Signed)
Physical Therapy Session Note  Patient Details  Name: Tyrone Washington MRN: [id number] Date of Birth: [birth year]  Today's Date: 05/20/2019 PT Individual Time: 1022-1104 PT Individual Time Calculation (min): 42 min   Short Term Goals: Week 1:  PT Short Term Goal 1 (Week 1): STGs = LTGs  Skilled Therapeutic Interventions/Progress Updates:   Pt received supine in bed and agreeable to therapy session. Supine>sitting EOB, HOB partially elevated, with supervision. Donned B shoes sitting EOB without assistance. Sit<>stands with close supervision/CGA for safety/steadying throughout session with pt self-selecting t hold the cane horizontally in both hands during transfer. Gait training ~1110ft to main therapy gym using SPC in R UE with CGA for steadying - demonstrates increased L LE step length compared to R, decreased B (L more impaired than R) ankle DF during initial contact, wide BOS, increased postural sway, and decreased gait speed - cuing throughout for improvement to have a more symmetrical gait pattern - therapist providing visual demonstration of sequencing LE stepping with AD. Seated B LE ankle DF against level 1 theraband resistance 2x20 reps each with cuing for proper technique. Gait ~28ft x2 to/from // bars using SPC with continued cuing for symmetrical gait and pt demonstrating improvement. In // bars performed standing heel raises 2x20 reps (initially using finger support for balance progressed to no UE support with CGA/min assist for steadying) and lateral side stepping down/back x2 no UE support with CGA/min assist for balance. Dynamic standing balance task of repeated R or L LE heel taps on 6" step focusing on single limb support and increased hip/knee flexion with ankle DF x20 reps each LE with min assist for balance. Gait training ~112ft back to room using [place] with CGA and 1x min assist for balance with continued cuing for symmetrical step lengths and increased ankle DF on initial contact. Pt  left sitting EOB with needs in reach and bed alarm on.  Therapy Documentation Precautions:  Precautions Precautions: Fall Restrictions Weight Bearing Restrictions: No  Pain:   Denies pain during session.   Therapy/Group: Individual Therapy  Tawana Scale, PT, DPT 05/20/2019, 7:56 AM

## 2019-05-20 NOTE — IPOC Note (Signed)
Overall Plan of Care [facility]) Patient Details Name: Tyrone Washington MRN: [id number] DOB: [birth year]  Admitting Diagnosis: Myelopathy [facility])  Hospital Problems: Principal Problem:   Myelopathy ([place]) Active Problems:   Leucocytosis   Hypoalbuminemia due to protein-calorie malnutrition ([place])   Steroid-induced hyperglycemia   Acute blood loss anemia     Functional Problem List: Nursing Bladder, Bowel, Endurance, Medication Management, Nutrition, Pain, Perception, Safety, Skin Integrity  PT Balance, Endurance, Motor  OT Balance, Safety, Sensory, Endurance, Motor  SLP    TR         Basic ADL's: OT Grooming, Bathing, Dressing, Toileting     Advanced  ADL's: OT Simple Meal Preparation, Laundry     Transfers: PT Bed Mobility, Bed to Chair, Teacher, early years/pre, Tub/Shower     Locomotion: PT Ambulation, Stairs     Additional Impairments: OT None  SLP        TR      Anticipated Outcomes Item Anticipated Outcome  Self Feeding No goal  Swallowing      Basic self-care  Mod I  Toileting  Mod I   Bathroom Transfers Mod I  Bowel/Bladder  continent of bowel and bladder with min assist  Transfers  mod I transfers  Locomotion  mod I gait and stairs  Communication     Cognition     Pain  pain les than or equal to 4/10 with min assist  Safety/Judgment  free from falls/injury and displaying appropriate safety judgement   Therapy Plan: PT Intensity: Minimum of 1-2 x/day ,45 to 90 minutes PT Frequency: 5 out of 7 days PT Duration Estimated Length of Stay: 7 days OT Intensity: Minimum of 1-2 x/day, 45 to 90 minutes OT Frequency: 5 out of 7 days OT Duration/Estimated Length of Stay: 7-10 days     Due to the current state of emergency, patients may not be receiving their 3-hours of Medicare-mandated therapy.   Team Interventions: Nursing Interventions Patient/Family Education, Bladder Management, Bowel Management, Disease Management/Prevention, Pain Management,  Medication Management, Skin Care/Wound Management, Discharge Planning  PT interventions Ambulation/gait training, Balance/vestibular training, Community reintegration, Functional electrical stimulation, DME/adaptive equipment instruction, Disease management/prevention, Functional mobility training, Neuromuscular re-education, Patient/family education, Stair training, Splinting/orthotics, Therapeutic Activities, Therapeutic Exercise, UE/LE Strength taining/ROM, UE/LE Coordination activities  OT Interventions Balance/vestibular training, Discharge planning, Pain management, Self Care/advanced ADL retraining, Therapeutic Activities, UE/LE Coordination activities, Visual/perceptual remediation/compensation, Therapeutic Exercise, Patient/family education, Skin care/wound managment, Functional mobility training, Disease mangement/prevention, Community reintegration, Engineer, drilling, Neuromuscular re-education, Psychosocial support, UE/LE Strength taining/ROM  SLP Interventions    TR Interventions    SW/CM Interventions Discharge Planning, Psychosocial Support, Patient/Family Education   Barriers to Discharge MD  Medical stability  Nursing      PT Decreased caregiver support    OT Pending chemo/radiation, Lack of/limited family support, Medical stability    SLP      SW Decreased caregiver support Caregiver works.   Team Discharge Planning: Destination: PT-Home ,OT- Home , SLP-  Projected Follow-up: PT-Home health PT, OT-  Home health OT, SLP-  Projected Equipment Needs: PT-To be determined, OT- Tub/shower bench, To be determined, SLP-  Equipment Details: PT- , OT-  Patient/family involved in discharge planning: PT- Patient,  OT-Patient, SLP-   MD ELOS: 7-8 days Medical Rehab Prognosis:  Excellent Assessment: Mr. Tyrone Washington has been medically stable with the exception of worsening leukocytosis. He has been sleeping well at night, having regular BM, pain is well controlled, Hgb is  trending upward, vital signs have  been stable. Chemo/radiation pending. Decreased caregiver support is a barrier to discharge.     See Team Conference Notes for weekly updates to the plan of care

## 2019-05-20 NOTE — Progress Notes (Signed)
Social Work Patient ID: Tyrone Washington, male   DOB: 04/11/1955, 63 y.o.   MRN: [id number]    SW met with pt in room to provide updates from team conference, and d/c date 05/27/2019. SW indicated there will be follow-up about final recommendations.  Auria Chamberlain, MSW, LCSWA Office: [phone] Cell: [phone] Fax: [phone]  

## 2019-05-20 NOTE — Progress Notes (Signed)
Physical Therapy Session Note  Patient Details  Name: Tyrone Washington MRN: [id number] Date of Birth: [birth year]  Today's Date: 05/20/2019 PT Individual Time: 0800-0855 AND 1415-1525 PT Individual Time Calculation (min): 55 min AND 70 min  Short Term Goals: Week 1:  PT Short Term Goal 1 (Week 1): STGs = LTGs  Skilled Therapeutic Interventions/Progress Updates:   Session 1: Pt in supine and agreeable to therapy, denies pain. Supervision bed mobility and to don shoes. Ambulated to/from therapy gym w/ SPC and CGA to close supervision. Verbal and visual cues for 2-point gait pattern vs 3-point gait pattern to increase speed and fluidity of gait, however he continues to ambulate very slow. Worked on static standing balance w/o UE support while putting together complex pipe trees, performed feet apart and then feet together w/ close supervision-CGA. Pt w/ excessive trunk/hip sway for static stance, however no overt LOB. Worked on sit<>stands on airex w/o UE assist to work on ankle and hip balance strategies, performed partial knee bends in stance on airex pad x10 reps at a time to work on functional BLE strengthening as well. Worked on limits of stability training and proprioception on biodex. Performed at beginner level, 92% w/ BUE support, 67% w/ unilateral UE, and 34% w/o UE support. Educated pt on balance deficits that greatly increase w/o UE assist. Ambulated back to room, ended session toileting and call bell within reach.   Session 2:  Pt in recliner and agreeable to therapy, no c/o pain. Ambulated around room w/ CGA using SPC to collect clothing to wash. Pt put clothing in bag and held onto bag w/ LUE while ambulating to washing machine w/ SPC in RUE. Washing machine already in use so returned to room to drop off clothing before proceeding w/ session. Overall, pt w/ good safety awareness, making adjustments to body mechanics and problem solving well when he could not carry all the clothing in 1  hand at once (hence putting clothing in bag to carry). Performed NuStep 5 min w/ all extremities @ level 3 to work on global endurance and strengthening, performed w/ BLEs only @ level 3 for another 5 min to work on BLE strengthening specifically. Emphasis on smooth and controlled reciprocal movement pattern. Worked on balance and ambulation w/o AD to challenge hip and ankle balance strategies. Pt appears to have wide-based, almost ataxic gait pattern 2/2 proprioceptive deficits as strength and sensation are near normal. Will continue to assess. Worked on ankle balance strategies while performing sit<>stands on airex pad and heel raises 2x10 reps. Ambulated back to room w/o AD, min assist and slow gait speed. However, pt w/ more fluid and step-through gait pattern than w/ SPC. Ended session in recliner, all needs in reach.   Therapy Documentation Precautions:  Precautions Precautions: Fall Restrictions Weight Bearing Restrictions: No Pain: Pain Assessment Pain Scale: 0-10 Pain Score: 0-No pain  Therapy/Group: Individual Therapy  Derryck Shahan K Sadiya Durand 05/20/2019, 9:59 AM

## 2019-05-21 ENCOUNTER — Inpatient Hospital Stay (HOSPITAL_COMMUNITY): Payer: BC Managed Care – PPO | Admitting: Physical Therapy

## 2019-05-21 ENCOUNTER — Inpatient Hospital Stay (HOSPITAL_COMMUNITY): Payer: BC Managed Care – PPO

## 2019-05-21 LAB — CBC
HCT: 25.2 % — ABNORMAL LOW (ref 39.0–52.0)
Hemoglobin: 8.1 g/dL — ABNORMAL LOW (ref 13.0–17.0)
MCH: 30.5 pg (ref 26.0–34.0)
MCHC: 32.1 g/dL (ref 30.0–36.0)
MCV: 94.7 fL (ref 80.0–100.0)
Platelets: 294 10*3/uL (ref 150–400)
RBC: 2.66 MIL/uL — ABNORMAL LOW (ref 4.22–5.81)
RDW: 16.6 % — ABNORMAL HIGH (ref 11.5–15.5)
WBC: 19.1 10*3/uL — ABNORMAL HIGH (ref 4.0–10.5)
nRBC: 2.4 % — ABNORMAL HIGH (ref 0.0–0.2)

## 2019-05-21 NOTE — Progress Notes (Signed)
Occupational Therapy Session Note  Patient Details  Name: Tyrone Washington MRN: [id number] Date of Birth: 03-27-1955  Today's Date: 05/21/2019 OT Individual Time: [id number] OT Individual Time Calculation (min): 68 min    Short Term Goals: Week 1:  OT Short Term Goal 1 (Week 1): STGs=LTGs due to ELOS  Skilled Therapeutic Interventions/Progress Updates:    1:1. Pt received in bed agreeable to tx. Pt completes all mobility with SPC and S-CGA with 1 VC for AE sequencing and pt able to carryover during session. Pt completes toileting with S and hand hygeine standing at sink with S. Pt completes mobility to ADL apartment to transfer onto recliner/couch with S. Pt able to reach for items during kitchen search activity and OT discusses modifications to simplify meal prep at d/c. Pt makes cup of coffee with kuerig with S at ambulatory level.  Pt completes ambulation to outside courtyard over uneven surfaces and transfers onto park bench with S in prep for community reentry. Pt left in room in recliner with NT checking vital signs  Therapy Documentation Precautions:  Precautions Precautions: Fall Restrictions Weight Bearing Restrictions: No General:   Vital Signs:   Pain:   ADL: ADL Eating: Not assessed Grooming: Setup Where Assessed-Grooming: Edge of bed Upper Body Bathing: Setup Where Assessed-Upper Body Bathing: Edge of bed Lower Body Bathing: Contact guard Where Assessed-Lower Body Bathing: Edge of bed Upper Body Dressing: Setup Where Assessed-Upper Body Dressing: Edge of bed Lower Body Dressing: Contact guard Where Assessed-Lower Body Dressing: Edge of bed Toileting: Not assessed Toilet Transfer: Minimal assistance Toilet Transfer Method: Ambulating(without device) Tub/Shower Transfer: Minimal assistance Tub/Shower Transfer Method: Ambulating(without device) Social research officer, government: (without device) Social research officer, government Method: Ambulating ADL Comments: Bathing tasks were  simulated during session Vision   Perception    Praxis   Exercises:   Other Treatments:     Therapy/Group: Individual Therapy  Tonny Branch 05/21/2019, 2:53 PM

## 2019-05-21 NOTE — Plan of Care (Signed)
  Problem: Consults Goal: RH SPINAL CORD INJURY PATIENT EDUCATION Description:  See Patient Education module for education specifics.  Outcome: Progressing Goal: Skin Care Protocol Initiated - if Braden Score 18 or less Description: If consults are not indicated, leave blank or document N/A Outcome: Progressing Goal: Nutrition Consult-if indicated Outcome: Progressing   Problem: SCI BOWEL ELIMINATION Goal: RH STG MANAGE BOWEL WITH ASSISTANCE Description: STG Manage Bowel with Assistance. Outcome: Progressing Goal: RH STG SCI MANAGE BOWEL WITH MEDICATION WITH ASSISTANCE Description: STG SCI Manage bowel with medication with assistance. Outcome: Progressing   Problem: RH SKIN INTEGRITY Goal: RH STG SKIN FREE OF INFECTION/BREAKDOWN Outcome: Progressing Goal: RH STG MAINTAIN SKIN INTEGRITY WITH ASSISTANCE Description: STG Maintain Skin Integrity With Assistance. Outcome: Progressing Goal: RH STG ABLE TO PERFORM INCISION/WOUND CARE W/ASSISTANCE Description: STG Able To Perform Incision/Wound Care With Assistance. Outcome: Progressing   Problem: RH SAFETY Goal: RH STG ADHERE TO SAFETY PRECAUTIONS W/ASSISTANCE/DEVICE Description: STG Adhere to Safety Precautions With Assistance/Device. Outcome: Progressing Goal: RH STG DECREASED RISK OF FALL WITH ASSISTANCE Description: STG Decreased Risk of Fall With Assistance. Outcome: Progressing   Problem: RH PAIN MANAGEMENT Goal: RH STG PAIN MANAGED AT OR BELOW PT'S PAIN GOAL Outcome: Progressing   Problem: RH KNOWLEDGE DEFICIT SCI Goal: RH STG INCREASE KNOWLEDGE OF SELF CARE AFTER SCI Outcome: Progressing

## 2019-05-21 NOTE — Progress Notes (Signed)
[place] PHYSICAL MEDICINE & REHABILITATION PROGRESS NOTE   Subjective/Complaints: Patient seen sitting up in chair this morning.  He states he slept well overnight.  Had 2 BM this morning.  Denies pain.    ROS: Denies CP, SOB, N/V/D  Objective:   No results found. Recent Labs    05/19/19 0708 05/21/19 0505  WBC 22.8* 19.1*  HGB 7.8* 8.1*  HCT 24.3* 25.2*  PLT 311 294   Recent Labs    05/18/19 1338 05/19/19 0539  NA 138 138  K 3.7 4.0  CL 104 105  CO2 24 26  GLUCOSE 139* 113*  BUN 18 17  CREATININE 1.05 0.98  CALCIUM 8.1* 8.2*    Intake/Output Summary (Last 24 hours) at 05/21/2019 1041 Last data filed at 05/21/2019 0730 Gross per 24 hour  Intake 960 ml  Output 1625 ml  Net -665 ml     Physical Exam: Vital Signs Blood pressure 116/77, pulse 75, temperature 98 F (36.7 C), temperature source Oral, resp. rate 18, SpO2 100 %. Constitutional: No distress . Vital signs reviewed. Sitting up in chair with no complaints.  HENT: Atraumatic.  Large neck mass Eyes: EOMI. No discharge. Cardiovascular: No JVD. Respiratory: Normal effort.  No stridor. GI: Non-distended. Skin: Warm and dry.  Intact. Psych: Normal mood.  Normal behavior. Musc: No edema in extremities.  No tenderness in extremities. Neuro:  Alert and oriented Motor: 4+/5 throughout   Assessment/Plan: 1. 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  which require 3+ hours per day of interdisciplinary therapy in a comprehensive inpatient rehab setting.  Physiatrist is providing close team supervision and 24 hour management of active medical problems listed below.  Physiatrist and rehab team continue to assess barriers to discharge/monitor patient progress toward functional and medical goals  Care Tool:  Bathing    Body parts bathed by patient: Left upper leg,  Right lower leg, Left lower leg, Face, Right upper leg, Buttocks, Front perineal area, Abdomen, Chest, Left arm, Right arm         Bathing assist Assist Level: Contact Guard/Touching assist     Upper Body Dressing/Undressing Upper body dressing   What is the patient wearing?: Pull over shirt    Upper body assist Assist Level: Contact Guard/Touching assist    Lower Body Dressing/Undressing Lower body dressing      What is the patient wearing?: Pants     Lower body assist Assist for lower body dressing: Contact Guard/Touching assist     Toileting Toileting Toileting Activity did not occur (Clothing management and hygiene only): N/A (no void or bm)  Toileting assist Assist for toileting: Supervision/Verbal cueing Assistive Device Comment: urinal   Transfers Chair/bed transfer  Transfers assist  Chair/bed transfer activity did not occur: Safety/medical concerns  Chair/bed transfer assist level: Contact Guard/Touching assist     Locomotion Ambulation   Ambulation assist      Assist level: Supervision/Verbal cueing Assistive device: Cane-straight Max distance: 150'   Walk 10 feet activity   Assist     Assist level: Supervision/Verbal cueing Assistive device: Cane-straight   Walk 50 feet activity   Assist    Assist level: Supervision/Verbal cueing Assistive device: Cane-straight    Walk 150 feet activity   Assist    Assist level: Supervision/Verbal cueing Assistive device: Cane-straight    Walk 10 feet on uneven surface  activity   Assist  Assist level: Minimal Assistance - Patient > 75%     Wheelchair     Assist Will patient use wheelchair at discharge?: No             Wheelchair 50 feet with 2 turns activity    Assist            Wheelchair 150 feet activity     Assist          Blood pressure 116/77, pulse 75, temperature 98 F (36.7 C), temperature source Oral, resp. rate 18, SpO2 100  %.  Medical Problem List and Plan: 1.  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  Oncology.Radiation Oncology services follow-up  Continue CR  Supervision goals in community; has the support of sister and friend. Balance continues to be impaired.  2. -DVT/anticoagulation. CT 3/2 showed multiple small pulmonary nodules scattered in lungs bilaterally and multiple segmental and subsegmental filling defect in pulmonary arteries compatible with PE.   Plan is to begin Xarelto 05/23/2019 and was discussed with neurosurgery Dr. Trenton Gammon             -antiplatelet therapy: N/A 3. Pain Management: Oxycodone as needed as well as Valium as needed for muscle spasms.   Controlled on 3/8, 3/9, 3/10.  4. Mood: Provide emotional support             -antipsychotic agents: N/A 5. Neuropsych: This patient is capable of making decisions on his own behalf. 6. Skin/Wound Care: Routine skin checks 7. Fluids/Electrolytes/Nutrition: Routine in and outs  8.  Acute blood loss anemia.    Hemoglobin 7.8 on 3/8, 8.1 on 3/10.   Continue to monitor 9.  Constipation.  Laxative assistance. Moving bowels regularly.  10.  Tobacco abuse.  Counseling 11.  Steroid-induced hyperglycemia  Continue to monitor 12.  Hypoalbuminemia  Supplement initiated on 3/8 13.  Leukocytosis-likely steroid-induced  Afebrile  WBCs 22.8 on 3/8. Trended down to 19.1 on 3/10.  14. DC on Tuesday March 16th.   LOS: 4 days A FACE TO FACE EVALUATION WAS PERFORMED  Clide Deutscher Destiny Trickey 05/21/2019, 10:41 AM

## 2019-05-21 NOTE — Progress Notes (Signed)
Physical Therapy Session Note  Patient Details  Name: Tyrone Washington MRN: [id number] Date of Birth: 1955-07-22  Today's Date: 05/21/2019 PT Individual Time: 0700-0753 AND [phone] PT Individual Time Calculation (min): 53 min AND 55 min  Short Term Goals: Week 1:  PT Short Term Goal 1 (Week 1): STGs = LTGs  Skilled Therapeutic Interventions/Progress Updates:   Session 1:  Pt in supine and agreeable to therapy, no c/o pain. Supine>sit independently and donned shoes at EOB w/ set-up assist. Ambulated to/from toilet w/ supervision, supervision toilet transfer. Ambulated around room w/ supervision using SPC to wash hands at sink, brush teeth, and collect clothing to wash clothes this AM. Ambulated to/from washing machine and around unit w/ close supervision using SPC while carrying clothes in other hand. Pt started laundry w/ supervision. Worked on ankle balance strategies in therapy gym w/ emphasis on maintaining balance and stability w/ SLS. Performed 3" step taps, blocked practice of 25+ on each side. Pt w/ increased sway L>R SLS but able to perform task w/ CGA and no overt LOB. Pt w/ improved stability bilaterally this session, able to control contralateral step tapping well. Performed tandem gait over visual line w/ UE support on rail, 30' x3. Then performed tandem w/ heel-to-toe, pt required multiple stepping strategies to regain LOB, however did so w/ CGA. Returned to room and ended session toileting, all needs in reach. Call bell within reach.  Session 2: Pt in recliner and agreeable to therapy, no c/o pain. Sit<>stand w/ supervision and ambulated to washing machine and around unit w/ supervision using [place]. Pt switched laundry from washing machine to dryer safely w/ supervision. Pt utilizing balance strategies and UE support on washer well. NuStep 5 min @ level 3 w/ BLEs to work on BLE strengthening and global endurance training. Worked on weight shifting balance strategies and proprioception  while playing Wii Fit game on balance board. Performed lateral weight shifting w/ visual feedback of game on TV screen, CGA for safety and verbal and tactile cues for weight shifting at hips vs leaning at trunk. Performed 2 min x3 reps. Hospital had tornado drill during session. Ambulated around unit w/ CGA in busy and dynamic environment. Pt safely taken to chair in hallway per RN request. Needed to toilet and ambulated to/from toilet w/ CGA, supervision toilet transfer before returning to hallway. Tornado drill over and ambulated to/from laundry room to retrieve clothing. Pt able to safely reach items in dryer w/ close supervision-CGA. Returned to room and ended session in supine, all needs in reach.   Therapy Documentation Precautions:  Precautions Precautions: Fall Restrictions Weight Bearing Restrictions: No Vital Signs: Therapy Vitals Temp: 98 F (36.7 C) Temp Source: Oral Pulse Rate: 75 Resp: 18 BP: 116/77 Patient Position (if appropriate): Lying Oxygen Therapy SpO2: 100 % O2 Device: Room Air  Therapy/Group: Individual Therapy  Nitza Schmid K Tagen Brethauer 05/21/2019, 8:02 AM

## 2019-05-21 NOTE — Discharge Instructions (Addendum)
Inpatient Rehab Discharge Instructions  Tyrone Washington Discharge date and time: No discharge date for patient encounter.   Activities/Precautions/ Functional Status: Activity: activity as tolerated Diet: regular diet Wound Care: keep wound clean and dry Functional status:  ___ No restrictions     ___ Walk up steps independently ___ 24/7 supervision/assistance   ___ Walk up steps with assistance ___ Intermittent supervision/assistance  ___ Bathe/dress independently ___ Walk with walker     _x__ Bathe/dress with assistance ___ Walk Independently    ___ Shower independently ___ Walk with assistance    ___ Shower with assistance ___ No alcohol     ___ Return to work/school ________  Special Instructions: No driving smoking or alcohol   My questions have been answered and I understand these instructions. I will adhere to these goals and the provided educational materials after my discharge from the hospital.  Patient/Caregiver Signature _______________________________ Date __________  Clinician Signature _______________________________________ Date __________  Please bring this form and your medication list with you to all your follow-up doctor's appointments.  ================================================= Information on my medicine - XARELTO (rivaroxaban)  This medication education was reviewed with me or my healthcare representative as part of my discharge preparation.   WHY WAS XARELTO PRESCRIBED FOR YOU? Xarelto was prescribed to treat blood clots that may have been found in the veins of your legs (deep vein thrombosis) or in your lungs (pulmonary embolism) and to reduce the risk of them occurring again.  What do you need to know about Xarelto? The starting dose is one 15 mg tablet taken TWICE daily with food for the FIRST 21 DAYS then on (enter date)  06/13/19  the dose is changed to one 20 mg tablet taken ONCE A DAY with your evening meal.  DO NOT stop taking Xarelto  without talking to the health care provider who prescribed the medication.  Refill your prescription for 20 mg tablets before you run out.  After discharge, you should have regular check-up appointments with your healthcare provider that is prescribing your Xarelto.  In the future your dose may need to be changed if your kidney function changes by a significant amount.  What do you do if you miss a dose? If you are taking Xarelto TWICE DAILY and you miss a dose, take it as soon as you remember. You may take two 15 mg tablets (total 30 mg) at the same time then resume your regularly scheduled 15 mg twice daily the next day.  If you are taking Xarelto ONCE DAILY and you miss a dose, take it as soon as you remember on the same day then continue your regularly scheduled once daily regimen the next day. Do not take two doses of Xarelto at the same time.   Important Safety Information Xarelto is a blood thinner medicine that can cause bleeding. You should call your healthcare provider right away if you experience any of the following: ? Bleeding from an injury or your nose that does not stop. ? Unusual colored urine (red or dark brown) or unusual colored stools (red or black). ? Unusual bruising for unknown reasons. ? A serious fall or if you hit your head (even if there is no bleeding).  Some medicines may interact with Xarelto and might increase your risk of bleeding while on Xarelto. To help avoid this, consult your healthcare provider or pharmacist prior to using any new prescription or non-prescription medications, including herbals, vitamins, non-steroidal anti-inflammatory drugs (NSAIDs) and supplements.  This website has more  information on Xarelto: [email or web address]://guerra-benson.[email or web address] =================================== Pulmonary Embolism    A pulmonary embolism (PE) is a sudden blockage or decrease of blood flow in one or both lungs. Most blockages come from a blood clot that forms in the vein of a  lower leg, thigh, or arm (deep vein thrombosis, DVT) and travels to the lungs. A clot is blood that has thickened into a gel or solid. PE is a dangerous and life-threatening condition that needs to be treated right away.  What are the causes? This condition is usually caused by a blood clot that forms in a vein and moves to the lungs. In rare cases, it may be caused by air, fat, part of a tumor, or other tissue that moves through the veins and into the lungs.  What increases the risk? The following factors may make you more likely to develop this condition: 1. Experiencing a traumatic injury, such as breaking a hip or leg. 2. Having: ? A spinal cord injury. ? Orthopedic surgery, especially hip or knee replacement. ? Any major surgery. ? A stroke. ? DVT. ? Blood clots or blood clotting disease. ? Long-term (chronic) lung or heart disease. ? Cancer treated with chemotherapy. ? A central venous catheter. 3. Taking medicines that contain estrogen. These include birth control pills and hormone replacement therapy. 4. Being: ? Pregnant. ? In the period of time after your baby is delivered (postpartum). ? Older than age 24. ? Overweight. ? A smoker, especially if you have other risks.  What are the signs or symptoms? Symptoms of this condition usually start suddenly and include:  Shortness of breath during activity or at rest.  Coughing, coughing up blood, or coughing up blood-tinged mucus.  Chest pain that is often worse with deep breaths.  Rapid or irregular heartbeat.  Feeling light-headed or dizzy.  Fainting.  Feeling anxious.  Fever.  Sweating.  Pain and swelling in a leg. This is a symptom of DVT, which can lead to PE. How is this diagnosed? This condition may be diagnosed based on:  Your medical history.  A physical exam.  Blood tests.  CT pulmonary angiogram. This test checks blood flow in and around your lungs.  Ventilation-perfusion scan, also called a  lung VQ scan. This test measures air flow and blood flow to the lungs.  An ultrasound of the legs.  How is this treated? Treatment for this condition depends on many factors, such as the cause of your PE, your risk for bleeding or developing more clots, and other medical conditions you have. Treatment aims to remove, dissolve, or stop blood clots from forming or growing larger. Treatment may include: 1. Medicines, such as: ? Blood thinning medicines (anticoagulants) to stop clots from forming. ? Medicines that dissolve clots (thrombolytics). 2. Procedures, such as: ? Using a flexible tube to remove a blood clot (embolectomy) or to deliver medicine to destroy it (catheter-directed thrombolysis). ? Inserting a filter into a large vein that carries blood to the heart (inferior vena cava). This filter (vena cava filter) catches blood clots before they reach the lungs. ? Surgery to remove the clot (surgical embolectomy). This is rare. You may need a combination of immediate, long-term (up to 3 months after diagnosis), and extended (more than 3 months after diagnosis) treatments. Your treatment may continue for several months (maintenance therapy). You and your health care provider will work together to choose the treatment program that is best for you.  Follow these instructions at home: Medicines 1. Take over-the-counter and  prescription medicines only as told by your health care provider. 2. If you are taking an anticoagulant medicine: ? Take the medicine every day at the same time each day. ? Understand what foods and drugs interact with your medicine. ? Understand the side effects of this medicine, including excessive bruising or bleeding. Ask your health care provider or pharmacist about other side effects.  General instructions  Wear a medical alert bracelet or carry a medical alert card that says you have had a PE and lists what medicines you take.  Ask your health care provider when you  may return to your normal activities. Avoid sitting or lying for a long time without moving.  Maintain a healthy weight. Ask your health care provider what weight is healthy for you.  Do not use any products that contain nicotine or tobacco, such as cigarettes, e-cigarettes, and chewing tobacco. If you need help quitting, ask your health care provider.  Talk with your health care provider about any travel plans. It is important to make sure that you are still able to take your medicine while on trips.  Keep all follow-up visits as told by your health care provider. This is important.  Contact a health care provider if:  You missed a dose of your blood thinner medicine.  Get help right away if: 1. You have: ? New or increased pain, swelling, warmth, or redness in an arm or leg. ? Numbness or tingling in an arm or leg. ? Shortness of breath during activity or at rest. ? A fever. ? Chest pain. ? A rapid or irregular heartbeat. ? A severe headache. ? Vision changes. ? A serious fall or accident, or you hit your head. ? Stomach (abdominal) pain. ? Blood in your vomit, stool, or urine. ? A cut that will not stop bleeding. 2. You cough up blood. 3. You feel light-headed or dizzy. 4. You cannot move your arms or legs. 5. You are confused or have memory loss.  These symptoms may represent a serious problem that is an emergency. Do not wait to see if the symptoms will go away. Get medical help right away. Call your local emergency services (911 in the [place] Do not drive yourself to the hospital. Summary  A pulmonary embolism (PE) is a sudden blockage or decrease of blood flow in one or both lungs. PE is a dangerous and life-threatening condition that needs to be treated right away.  Treatments for this condition usually include medicines to thin your blood (anticoagulants) or medicines to break apart blood clots (thrombolytics).  If you are given blood thinners, it is important to take  the medicine every day at the same time each day.  Understand what foods and drugs interact with any medicines that you are taking.  If you have signs of PE or DVT, call your local emergency services (911 in the [place] This information is not intended to replace advice given to you by your health care provider. Make sure you discuss any questions you have with your health care provider. Document Revised: 12/05/2017 Document Reviewed: 12/05/2017 Elsevier Patient Education  2020 [place] on my medicine - XARELTO (rivaroxaban)  This medication education was reviewed with me or my healthcare representative as part of my discharge preparation.  The pharmacist that spoke with me during my hospital stay was:  Tyrone Washington  Bronte Kropf,, [place]? Xarelto was prescribed to treat blood clots that may have been found  in the veins of your legs (deep vein thrombosis) or in your lungs (pulmonary embolism) and to reduce the risk of them occurring again.  What do you need to know about Xarelto? The starting dose is one 15 mg tablet taken TWICE daily with food for the FIRST 21 DAYS then on (enter date)  06/13/2019  the dose is changed to one 20 mg tablet taken ONCE A DAY with your evening meal.  DO NOT stop taking Xarelto without talking to the health care provider who prescribed the medication.  Refill your prescription for 20 mg tablets before you run out.  After discharge, you should have regular check-up appointments with your healthcare provider that is prescribing your Xarelto.  In the future your dose may need to be changed if your kidney function changes by a significant amount.  What do you do if you miss a dose? If you are taking Xarelto TWICE DAILY and you miss a dose, take it as soon as you remember. You may take two 15 mg tablets (total 30 mg) at the same time then resume your regularly scheduled 15 mg twice daily the next day.  If you are  taking Xarelto ONCE DAILY and you miss a dose, take it as soon as you remember on the same day then continue your regularly scheduled once daily regimen the next day. Do not take two doses of Xarelto at the same time.   Important Safety Information Xarelto is a blood thinner medicine that can cause bleeding. You should call your healthcare provider right away if you experience any of the following: ? Bleeding from an injury or your nose that does not stop. ? Unusual colored urine (red or dark brown) or unusual colored stools (red or black). ? Unusual bruising for unknown reasons. ? A serious fall or if you hit your head (even if there is no bleeding).  Some medicines may interact with Xarelto and might increase your risk of bleeding while on Xarelto. To help avoid this, consult your healthcare provider or pharmacist prior to using any new prescription or non-prescription medications, including herbals, vitamins, non-steroidal anti-inflammatory drugs (NSAIDs) and supplements.  This website has more information on Xarelto: [email or web address]-[email or web address].[email or web address]

## 2019-05-22 ENCOUNTER — Inpatient Hospital Stay (HOSPITAL_COMMUNITY): Payer: BC Managed Care – PPO | Admitting: Physical Therapy

## 2019-05-22 ENCOUNTER — Inpatient Hospital Stay (HOSPITAL_COMMUNITY): Payer: BC Managed Care – PPO | Admitting: *Deleted

## 2019-05-22 ENCOUNTER — Inpatient Hospital Stay (HOSPITAL_COMMUNITY): Payer: BC Managed Care – PPO

## 2019-05-22 MED ORDER — DEXAMETHASONE 4 MG PO TABS: 6.0000 mg | ORAL_TABLET | Freq: Three times a day (TID) | ORAL | Status: DC

## 2019-05-22 MED ORDER — DEXAMETHASONE 4 MG PO TABS: 4.0000 mg | ORAL_TABLET | Freq: Two times a day (BID) | ORAL | Status: DC

## 2019-05-22 MED ORDER — DEXAMETHASONE 2 MG PO TABS: 2.0000 mg | ORAL_TABLET | Freq: Two times a day (BID) | ORAL | Status: DC

## 2019-05-22 MED ORDER — DEXAMETHASONE 2 MG PO TABS: 2.0000 mg | ORAL_TABLET | Freq: Every day | ORAL | Status: DC

## 2019-05-22 MED ORDER — DEXAMETHASONE 4 MG PO TABS
6.0000 mg | ORAL_TABLET | Freq: Two times a day (BID) | ORAL | Status: DC
  Administered 2019-05-25 – 2019-05-27 (×5): 6 mg via ORAL
  Filled 2019-05-22 (×5): qty 1

## 2019-05-22 MED ORDER — DEXAMETHASONE 4 MG PO TABS
6.0000 mg | ORAL_TABLET | Freq: Three times a day (TID) | ORAL | Status: AC
  Administered 2019-05-22 – 2019-05-24 (×8): 6 mg via ORAL
  Filled 2019-05-22 (×8): qty 1

## 2019-05-22 MED ORDER — DEXAMETHASONE 4 MG PO TABS: 6.0000 mg | ORAL_TABLET | Freq: Two times a day (BID) | ORAL | Status: DC

## 2019-05-22 MED ORDER — DEXAMETHASONE 2 MG PO TABS: 1.0000 mg | ORAL_TABLET | Freq: Every day | ORAL | Status: DC

## 2019-05-22 NOTE — Progress Notes (Signed)
Physical Therapy Session Note  Patient Details  Name: Tyrone Washington MRN: [id number] Date of Birth: [birth year]  Today's Date: 05/22/2019 PT Individual Time: 1400-1500 PT Individual Time Calculation (min): 60 min   Short Term Goals: Week 1:  PT Short Term Goal 1 (Week 1): STGs = LTGs  Skilled Therapeutic Interventions/Progress Updates:   Pt received supine in bed and agreeable to PT. Supine>sit transfer without assist or cues. Pt able to don shoes sitting EOB with set up assist.  Ambulatory transfer to toilet with [facility] and supervision assist. Pt able to void bladder sitting on toilet as well as performed pericare and clothing management with supervision assist. Hand hygiene at sink with supervision assist and ue supported on counter.   Pt transported to entrance of [place] in [place]. Gait training with SPC on unlevel surface including cement sidewalk 2 x 119f, bricked sidewalk x 739f and up/down WC access sidewalk x [id number] assist overall with min cues for sequencing of SPC with gait pattern and improved step width to prevent stepping off sidewalk on the R. No foot drag noted throughout gait training. Pt did require several multiple standing rest breaks to correct balance when  Self reporting mild LOB, but no assist needed.   Patient returned to room and left sitting in recliner with call bell in reach and all needs met.          Therapy Documentation Precautions:  Precautions Precautions: Fall Restrictions Weight Bearing Restrictions: No Vital Signs: Therapy Vitals Temp: 98.2 F (36.8 C) Pulse Rate: 87 Resp: 17 BP: 114/71 Patient Position (if appropriate): Lying Oxygen Therapy SpO2: 100 % O2 Device: Room Air Pain: denies   Therapy/Group: Individual Therapy  AusLorie Phenix11/2021, 3:22 PM

## 2019-05-22 NOTE — Progress Notes (Signed)
[place] PHYSICAL MEDICINE & REHABILITATION PROGRESS NOTE   Subjective/Complaints: Patient seen sitting up in chair this morning.  He states he slept well overnight.  Having regular BM. Denies pain.   Asks whether we have been able to fax medical records.   ROS: Denies CP, SOB, N/V/D  Objective:   No results found. Recent Labs    05/21/19 0505  WBC 19.1*  HGB 8.1*  HCT 25.2*  PLT 294   No results for input(s): NA, K, CL, CO2, GLUCOSE, BUN, CREATININE, CALCIUM in the last 72 hours.  Intake/Output Summary (Last 24 hours) at 05/22/2019 0900 Last data filed at 05/22/2019 0800 Gross per 24 hour  Intake 720 ml  Output 1550 ml  Net -830 ml     Physical Exam: Vital Signs Blood pressure 120/90, pulse 88, temperature 98.2 F (36.8 C), temperature source Oral, resp. rate 16, SpO2 100 %. Constitutional: No distress . Vital signs reviewed. Lying in bed after therapy sessions.  HENT: Atraumatic.  Large neck mass Eyes: EOMI. No discharge. Cardiovascular: No JVD. Respiratory: Normal effort.  No stridor. GI: Non-distended. Skin: Warm and dry.  Intact. Psych: Normal mood.  Normal behavior. Musc: No edema in extremities.  No tenderness in extremities. Neuro:  Alert and oriented Motor: 4+/5 throughout. Ambulated to and from gym and down the stairs and outside yesterday.   Assessment/Plan: 1. 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  which require 3+ hours per day of interdisciplinary therapy in a comprehensive inpatient rehab setting.  Physiatrist is providing close team supervision and 24 hour management of active medical problems listed below.  Physiatrist and rehab team continue to assess barriers to discharge/monitor patient progress toward functional and medical goals  Care Tool:  Bathing    Body parts bathed by patient: Left upper  leg, Right lower leg, Left lower leg, Face, Right upper leg, Buttocks, Front perineal area, Abdomen, Chest, Left arm, Right arm         Bathing assist Assist Level: Contact Guard/Touching assist     Upper Body Dressing/Undressing Upper body dressing   What is the patient wearing?: Pull over shirt    Upper body assist Assist Level: Contact Guard/Touching assist    Lower Body Dressing/Undressing Lower body dressing      What is the patient wearing?: Pants     Lower body assist Assist for lower body dressing: Contact Guard/Touching assist     Toileting Toileting Toileting Activity did not occur (Clothing management and hygiene only): N/A (no void or bm)  Toileting assist Assist for toileting: Supervision/Verbal cueing Assistive Device Comment: urinal   Transfers Chair/bed transfer  Transfers assist  Chair/bed transfer activity did not occur: Safety/medical concerns  Chair/bed transfer assist level: Supervision/Verbal cueing     Locomotion Ambulation   Ambulation assist      Assist level: Supervision/Verbal cueing Assistive device: Cane-straight Max distance: 150'   Walk 10 feet activity   Assist     Assist level: Supervision/Verbal cueing Assistive device: Cane-straight   Walk 50 feet activity   Assist    Assist level: Supervision/Verbal cueing Assistive device: Cane-straight    Walk 150 feet activity   Assist    Assist level: Supervision/Verbal cueing Assistive device: Cane-straight    Walk 10 feet on uneven surface  activity   Assist     Assist level: Minimal Assistance - Patient > 75%     Wheelchair  Assist Will patient use wheelchair at discharge?: No             Wheelchair 50 feet with 2 turns activity    Assist            Wheelchair 150 feet activity     Assist          Blood pressure 120/90, pulse 88, temperature 98.2 F (36.8 C), temperature source Oral, resp. rate 16, SpO2 100  %.  Medical Problem List and Plan: 1.  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  Oncology.Radiation Oncology services follow-up  Continue CR  Supervision goals in community; has the support of sister and friend. Balance continues to be impaired.  2. -DVT/anticoagulation. CT 3/2 showed multiple small pulmonary nodules scattered in lungs bilaterally and multiple segmental and subsegmental filling defect in pulmonary arteries compatible with PE.   Plan is to begin Xarelto 05/23/2019 and was discussed with neurosurgery Dr. Trenton Gammon             -antiplatelet therapy: N/A 3. Pain Management: Oxycodone as needed as well as Valium as needed for muscle spasms.   Controlled on 3/8, 3/9, 3/10, 3/11 4. Mood: Provide emotional support             -antipsychotic agents: N/A 5. Neuropsych: This patient is capable of making decisions on his own behalf. 6. Skin/Wound Care: Routine skin checks 7. Fluids/Electrolytes/Nutrition: Routine in and outs  8.  Acute blood loss anemia.    Hemoglobin 7.8 on 3/8, 8.1 on 3/10.   Continue to monitor 9.  Constipation.  Laxative assistance. Moving bowels regularly.  10.  Tobacco abuse.  Counseling 11.  Steroid-induced hyperglycemia  Continue to monitor 12.  Hypoalbuminemia  Supplement initiated on 3/8 13.  Leukocytosis-likely steroid-induced  Afebrile  WBCs 22.8 on 3/8. Trended down to 19.1 on 3/10.  14. DC on Tuesday March 16th. Will touch base with [facility] today regarding sending medical records for short disability. He only has a few steps to navigate at home.   LOS: 5 days A FACE TO FACE EVALUATION WAS PERFORMED  Shenee Wignall P Wil Slape 05/22/2019, 9:00 AM

## 2019-05-22 NOTE — Progress Notes (Signed)
Recreational Therapy Session Note  Patient Details  Name: Tyrone Washington MRN: [id number] Date of Birth: March 04, 1956 Today's Date: 05/22/2019 Time:  1303-1320 Pain: no c/o Skilled Therapeutic Interventions/Progress Updates: Full TR eval deferred as pt is expected to discharge 3/16 and LRT will be absent until 3/17.  Met with pt today to discuss/provide education on the importance of leisure/staying active and use of relaxation strategies after discharge.  Discussed life satisfiers and their impact on further recovery and overall health and wellness.  Pt stated understanding.  Therapy/Group: Individual Therapy   Murphy Bundick 05/22/2019, 3:47 PM

## 2019-05-22 NOTE — Progress Notes (Signed)
Social Work Patient ID: Tyrone Washington, male   DOB: [birth year], 64 y.o.   MRN: [id number]    Per PT, pt needs a cane as the cane in the room is not sturdy.  SW to follow-up with pt.  SW ordered [facility] (single point cane) from [employer].   Loralee Pacas, MSW, [place] Office: [phone] Cell: [phone] Fax: [phone]

## 2019-05-22 NOTE — Progress Notes (Signed)
Transitions of Care Pharmacist Note  Tyrone Washington is a 64 y.o. male that has been diagnosed with PE and will be prescribed Xarelto (rivaroxaban) at discharge.   Patient Education: I provided the following education on 05/22/2019 to the patient: How to take the medication Described what the medication is Signs of bleeding Signs/symptoms of VTE and stroke  Answered their questions  Discharge Medications Plan: The patient wants to have their discharge medications filled by the Transitions of Care pharmacy rather than their usual pharmacy.  The discharge orders pharmacy has been changed to the Transitions of Care pharmacy, the patient will receive a phone call regarding co-pay, and their medications will be delivered by the Transitions of Care pharmacy.    Thank you,   Berenice Bouton, PharmD PGY1 Pharmacy Resident  Please check AMION for all [place] phone numbers After 10:00 PM, call [place] [phone]  May 22, 2019

## 2019-05-22 NOTE — Progress Notes (Signed)
Occupational Therapy Session Note  Patient Details  Name: Tyrone Washington MRN: [id number] Date of Birth: [birth year]  Today's Date: 05/22/2019 OT Individual Time: 0930-1040 OT Individual Time Calculation (min): 70 min    Short Term Goals: Week 1:  OT Short Term Goal 1 (Week 1): STGs=LTGs due to ELOS  Skilled Therapeutic Interventions/Progress Updates:    1:1. Pt received in bed agreeable to OT with no pain reported. Pt wanting to bathe and dress and ok to go to tub room in ADL apartment to bathe in similar environment to home. Pt edu re TTB trasnfer and sequencing requiring S to transfer in but CGA after shower to transfer out d/t pt forgetting to swing both feet over edge and steps 1 foot out. Pt completes bathing seated at TTB with lateral leans with S. Pt completes dressing sit to stand from [facility] in bathroom with setup. Pt gathers all clothing from floor with S and ambulates to make cup of coffe with VC for looking ahead when walking in hallway. Pt remains in recliner at end of session with call light in reach and all needs met  Therapy Documentation Precautions:  Precautions Precautions: Fall Restrictions Weight Bearing Restrictions: No General:   Vital Signs:   Pain:   ADL: ADL Eating: Not assessed Grooming: Setup Where Assessed-Grooming: Edge of bed Upper Body Bathing: Setup Where Assessed-Upper Body Bathing: Edge of bed Lower Body Bathing: Contact guard Where Assessed-Lower Body Bathing: Edge of bed Upper Body Dressing: Setup Where Assessed-Upper Body Dressing: Edge of bed Lower Body Dressing: Contact guard Where Assessed-Lower Body Dressing: Edge of bed Toileting: Not assessed Toilet Transfer: Minimal assistance Toilet Transfer Method: Ambulating(without device) Tub/Shower Transfer: Minimal assistance Tub/Shower Transfer Method: Ambulating(without device) Social research officer, government: (without device) Social research officer, government Method: Ambulating ADL Comments: Bathing  tasks were simulated during session Vision   Perception    Praxis   Exercises:   Other Treatments:     Therapy/Group: Individual Therapy  Tonny Branch 05/22/2019, 10:42 AM

## 2019-05-22 NOTE — Progress Notes (Signed)
Physical Therapy Session Note  Patient Details  Name: Tyrone Washington MRN: [id number] Date of Birth: May 21, 1955  Today's Date: 05/22/2019 PT Individual Time: 0800-0854 PT Individual Time Calculation (min): 54 min   Short Term Goals: Week 1:  PT Short Term Goal 1 (Week 1): STGs = LTGs  Skilled Therapeutic Interventions/Progress Updates:   Pt in supine and agreeable to therapy, denies pain. Independent bed mobility and donning of shoes. Ambulated to/from bathroom w/ supervision using SPC. Pt opened/closed door of bathroom w/ supervision and performed toilet transfer w/ supervision. Supervision to perform self-care tasks at sink in standing w/o UE support. Ambulated to/from therapy gym and around unit w/ supervision using SPC, verbal and visual cues for 2-point gait pattern and for increased heel strike bilaterally. Worked on SLS balance and ankle balance strategies while in parallel bars. Performed 8" alternating step taps from airex pad 3x10, standing knee marches on airex pad 2x10, and static stance in romberg w/ eyes closed 15-20 sec x3 reps. All tasks performed w/ light to no UE support on parallel bars and CGA for safety. Mirror for visual feedback of step placement, pt expresses difficulty w/ knowing where he places his feet when not looking down. Practiced negotiating 4 steps w/ unilateral handrail (as per home set-up) and SPC, verbal cues for technique and pt performed w/ supervision. Ambulated back to room. Ended session in supine, all needs in reach.   Therapy Documentation Precautions:  Precautions Precautions: Fall Restrictions Weight Bearing Restrictions: No Vital Signs: Therapy Vitals Temp: 98.2 F (36.8 C) Temp Source: Oral Pulse Rate: 88 Resp: 16 BP: 120/90 Patient Position (if appropriate): Lying Oxygen Therapy SpO2: 100 %  Therapy/Group: Individual Therapy  Lavone Weisel Clent Demark 05/22/2019, 8:55 AM

## 2019-05-23 ENCOUNTER — Inpatient Hospital Stay (HOSPITAL_COMMUNITY): Payer: BC Managed Care – PPO | Admitting: Physical Therapy

## 2019-05-23 ENCOUNTER — Inpatient Hospital Stay (HOSPITAL_COMMUNITY): Payer: BC Managed Care – PPO

## 2019-05-23 LAB — GLUCOSE, CAPILLARY
Glucose-Capillary: 97 mg/dL (ref 70–99)
Glucose-Capillary: 108 mg/dL — ABNORMAL HIGH (ref 70–99)

## 2019-05-23 LAB — CBC
HCT: 27.3 % — ABNORMAL LOW (ref 39.0–52.0)
Hemoglobin: 8.7 g/dL — ABNORMAL LOW (ref 13.0–17.0)
MCH: 30.5 pg (ref 26.0–34.0)
MCHC: 31.9 g/dL (ref 30.0–36.0)
MCV: 95.8 fL (ref 80.0–100.0)
Platelets: 279 10*3/uL (ref 150–400)
RBC: 2.85 MIL/uL — ABNORMAL LOW (ref 4.22–5.81)
RDW: 17.3 % — ABNORMAL HIGH (ref 11.5–15.5)
WBC: 15.6 10*3/uL — ABNORMAL HIGH (ref 4.0–10.5)
nRBC: 1.2 % — ABNORMAL HIGH (ref 0.0–0.2)

## 2019-05-23 NOTE — Progress Notes (Signed)
[place] PHYSICAL MEDICINE & REHABILITATION PROGRESS NOTE   Subjective/Complaints: Mr. Tureaud was seen ambulating in hallway this morning- very much improved ambulation! We discussed further his concerns about his short-term disability; SW to follow-up with him regarding this.   ROS: Denies CP, SOB, N/V/D  Objective:   No results found. Recent Labs    05/21/19 0505 05/23/19 0533  WBC 19.1* 15.6*  HGB 8.1* 8.7*  HCT 25.2* 27.3*  PLT 294 279   No results for input(s): NA, K, CL, CO2, GLUCOSE, BUN, CREATININE, CALCIUM in the last 72 hours.  Intake/Output Summary (Last 24 hours) at 05/23/2019 1052 Last data filed at 05/23/2019 0735 Gross per 24 hour  Intake 780 ml  Output 2700 ml  Net -1920 ml     Physical Exam: Vital Signs Blood pressure (!) 115/59, pulse 73, temperature (!) 97.4 F (36.3 C), temperature source Oral, resp. rate 16, SpO2 100 %. Constitutional: No distress . Vital signs reviewed. Ambulating in hallway with PT, supervision level.  HENT: Atraumatic.  Large neck mass Eyes: EOMI. No discharge. Cardiovascular: No JVD. Respiratory: Normal effort.  No stridor. GI: Non-distended. Skin: Warm and dry.  Intact. Psych: Normal mood.  Normal behavior. Musc: No edema in extremities.  No tenderness in extremities. Neuro:  Alert and oriented Motor: 4+/5 throughout. Ambulated to and from gym and down the stairs and outside yesterday.   Assessment/Plan: 1. 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  which require 3+ hours per day of interdisciplinary therapy in a comprehensive inpatient rehab setting.  Physiatrist is providing close team supervision and 24 hour management of active medical problems listed below.  Physiatrist and rehab team continue to assess barriers to discharge/monitor patient progress toward functional and medical  goals  Care Tool:  Bathing    Body parts bathed by patient: Left upper leg, Right lower leg, Left lower leg, Face, Right upper leg, Buttocks, Front perineal area, Abdomen, Chest, Left arm, Right arm         Bathing assist Assist Level: Contact Guard/Touching assist     Upper Body Dressing/Undressing Upper body dressing   What is the patient wearing?: Pull over shirt    Upper body assist Assist Level: Contact Guard/Touching assist    Lower Body Dressing/Undressing Lower body dressing      What is the patient wearing?: Pants     Lower body assist Assist for lower body dressing: Contact Guard/Touching assist     Toileting Toileting Toileting Activity did not occur (Clothing management and hygiene only): N/A (no void or bm)  Toileting assist Assist for toileting: Supervision/Verbal cueing Assistive Device Comment: urinal   Transfers Chair/bed transfer  Transfers assist  Chair/bed transfer activity did not occur: Safety/medical concerns  Chair/bed transfer assist level: Supervision/Verbal cueing     Locomotion Ambulation   Ambulation assist      Assist level: Supervision/Verbal cueing Assistive device: Cane-straight Max distance: 150'   Walk 10 feet activity   Assist     Assist level: Supervision/Verbal cueing Assistive device: Cane-straight   Walk 50 feet activity   Assist    Assist level: Supervision/Verbal cueing Assistive device: Cane-straight    Walk 150 feet activity   Assist    Assist level: Supervision/Verbal cueing Assistive device: Cane-straight    Walk 10 feet on uneven surface  activity   Assist     Assist level: Minimal Assistance - Patient > 75%  Wheelchair     Assist Will patient use wheelchair at discharge?: No             Wheelchair 50 feet with 2 turns activity    Assist            Wheelchair 150 feet activity     Assist          Blood pressure (!) 115/59, pulse 73,  temperature (!) 97.4 F (36.3 C), temperature source Oral, resp. rate 16, SpO2 100 %.  Medical Problem List and Plan: 1.  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  Oncology.Radiation Oncology services follow-up  Continue CR  Supervision goals in community; has the support of sister and friend. Balance continues to be impaired.  2. -DVT/anticoagulation. CT 3/2 showed multiple small pulmonary nodules scattered in lungs bilaterally and multiple segmental and subsegmental filling defect in pulmonary arteries compatible with PE.   Plan is to begin Xarelto 05/23/2019 and was discussed with neurosurgery Dr. Trenton Gammon. Started this morning.              -antiplatelet therapy: N/A 3. Pain Management: Oxycodone as needed as well as Valium as needed for muscle spasms.   Controlled on 3/8, 3/9, 3/10, 3/11, 3/12.  4. Mood: Provide emotional support             -antipsychotic agents: N/A 5. Neuropsych: This patient is capable of making decisions on his own behalf. 6. Skin/Wound Care: Routine skin checks 7. Fluids/Electrolytes/Nutrition: Routine in and outs  8.  Acute blood loss anemia.    Hemoglobin 7.8 on 3/8, 8.1 on 3/10.   Continue to monitor 9.  Constipation.  Laxative assistance. Moving bowels regularly.  10.  Tobacco abuse.  Counseling 11.  Steroid-induced hyperglycemia  Continue to monitor  3/12: Initiated steroid taper yesterday.  12.  Hypoalbuminemia  Supplement initiated on 3/8 13.  Leukocytosis-likely steroid-induced  Afebrile  WBCs 22.8 on 3/8. Trended down to 19.1 on 3/10.  14. DC on Tuesday March 16th. Touched base with Auria yesterday regarding sending medical records for short disability and she said she would follow-up with him regarding this. He only has a few steps to navigate at home.   LOS: 6 days A FACE TO FACE EVALUATION WAS PERFORMED  Clide Deutscher Josep Luviano 05/23/2019,  10:52 AM

## 2019-05-23 NOTE — Progress Notes (Signed)
Occupational Therapy Session Note  Patient Details  Name: NIKOLAS BULOW MRN: [id number] Date of Birth: September 14, 1955  Today's Date: 05/23/2019 OT Individual Time: [id number] OT Individual Time Calculation (min): 72 min    Short Term Goals: Week 1:  OT Short Term Goal 1 (Week 1): STGs=LTGs due to ELOS  Skilled Therapeutic Interventions/Progress Updates:    1:1. Pt received in bed agreeable to OT pt completes all mobility with S and 1 instance of CGA when turning head/talking in hallway to someone passing with lateral LOB R. Pt completes all aspects of laundry tasks throughotu session with S and VC for slowing down prior to squatting to get items out of dryer. Pt completes sweeping task with education on use of LH dustpan or sweeper/swiffer to eliminate bending to floot. Pt completes standing wii bowling task with no seated rest break and CS for balance and ataxic trunk noticeable in static stance. Pt completes biodex weight shifting training with min VC for weight shift from hips and following with head. Exited session with pt seated in bed, exit alarm onand call light inreach  Therapy Documentation Precautions:  Precautions Precautions: Fall Restrictions Weight Bearing Restrictions: No General:   Vital Signs:  Pain: Pain Assessment Pain Scale: 0-10 Pain Score: 0-No pain ADL: ADL Eating: Not assessed Grooming: Setup Where Assessed-Grooming: Edge of bed Upper Body Bathing: Setup Where Assessed-Upper Body Bathing: Edge of bed Lower Body Bathing: Contact guard Where Assessed-Lower Body Bathing: Edge of bed Upper Body Dressing: Setup Where Assessed-Upper Body Dressing: Edge of bed Lower Body Dressing: Contact guard Where Assessed-Lower Body Dressing: Edge of bed Toileting: Not assessed Toilet Transfer: Minimal assistance Toilet Transfer Method: Ambulating(without device) Tub/Shower Transfer: Minimal assistance Tub/Shower Transfer Method: Ambulating(without device) Press photographer: (without device) Social research officer, government Method: Ambulating ADL Comments: Bathing tasks were simulated during session Vision   Perception    Praxis   Exercises:   Other Treatments:     Therapy/Group: Individual Therapy  Tonny Branch 05/23/2019, 10:43 AM

## 2019-05-23 NOTE — Progress Notes (Signed)
Physical Therapy Session Note  Patient Details  Name: Tyrone Washington MRN: [id number] Date of Birth: 1956/01/18  Today's Date: 05/23/2019 PT Individual Time: 0800-0855 AND 1350-1500 PT Individual Time Calculation (min): 55 min AND 70 min  Short Term Goals: Week 1:  PT Short Term Goal 1 (Week 1): STGs = LTGs  Skilled Therapeutic Interventions/Progress Updates:   Session 1:  Pt in supine and agreeable to therapy, no c/o pain. Independent bed mobility and pt ambulated around room w/ supervision using SPC, performed toilet transfer and self-care tasks in standing at sink w/ supervision as well. Donned shoes independently at EOB prior to leaving room. Ambulated to/from therapy gym and around unit w/ supervision using [place]. Verbal and visual cues throughout for 2-point gait pattern to increase to more functional gait speed and to decrease step length (pt often w/ excessively long step lengths). Session focused on gait in household environment and functional mobility, all activities performed w/ supervision. Practiced floor transfer, verbal cues for technique. Negotiated 4 steps w/ SPC and L rail, as per home set-up. Practiced furniture transfers to low surfaces and carrying coffee cup full of water as pt drinks coffee throughout the day. Educated on pacing strategies and energy conservation. Pt admits he typically moves quickly and is a get-up and go type of person. He acknowledges that he will need to move slower and more cautious as he continues to recovery from surgery. Returned to room and ended session in recliner, all needs in reach.    Session 2:  Pt in supine and agreeable to therapy, no c/o pain. Independent bed mobility and pt donned shoes independently at EOB. Pt ambulated to/from bathroom and performed toilet transfer and hand hygiene at sink w/ supervision. Worked on community ambulation, ambulated to outside of hospital and ambulated over sidewalks, up/down stairs w/ unilateral rail, and  over uneven terrain. All performed w/ supervision and in multiple 150-300' bouts. Intermittent seated rest breaks 2/2 fatigue. Returned to unit and performed [employer], scored 43/56. This is an improvement from previous score on 3/8 of 34/56. Explained significance of results and of improvement to pt. Performed NuStep 5 min @ level 3 for global strengthening and endurance training. Ambulated back to room, ended session in recliner, all needs in reach. Provided pt w/ written handout discussing fall safety in the home, written education to reinforce discussions had this week regarding minimizing fall risk at d/c. Also provided w/ written handout discussing cancer-related fatigue. Pt w/ plans to undergo radiation treatment in near future and have discussed impact of cancer treatment on fatigue and endurance. Pt appreciative.   Therapy Documentation Precautions:  Precautions Precautions: Fall Restrictions Weight Bearing Restrictions: No  Therapy/Group: Individual Therapy  Tyrone Washington 05/23/2019, 8:55 AM

## 2019-05-23 NOTE — Progress Notes (Signed)
Social Work Patient ID: Vergie Living, male   DOB: 07/01/1955, 64 y.o.   MRN: [id number]    SW met with to discuss d/c recommendations of RW and HHPT/OT. SW informed DME ordered through [facility]. Pt preferred [facility] is [facility]. SW spoke with Luna [phone]). SW waiting on follow-up.  *[facility] declined referral.  SW left message for Tiffany/[facility] for HHPT/OT referral. SW waiting on follow-up.   Loralee Pacas, MSW, [place] Office: [phone] Cell: [phone] Fax: [phone]

## 2019-05-24 ENCOUNTER — Inpatient Hospital Stay (HOSPITAL_COMMUNITY): Payer: BC Managed Care – PPO

## 2019-05-24 NOTE — Progress Notes (Signed)
[place] PHYSICAL MEDICINE & REHABILITATION PROGRESS NOTE   Subjective/Complaints:  Pt reports doing great- d/c Tuesday- no pain; sleeping well- bowel sworking "too well"- a little loose.  2x/friday; 1x today.  Legs tingling some.   ROS: Denies CP, SOB, N/V/D  Objective:   No results found. Recent Labs    05/23/19 0533  WBC 15.6*  HGB 8.7*  HCT 27.3*  PLT 279   No results for input(s): NA, K, CL, CO2, GLUCOSE, BUN, CREATININE, CALCIUM in the last 72 hours.  Intake/Output Summary (Last 24 hours) at 05/24/2019 1622 Last data filed at 05/24/2019 1439 Gross per 24 hour  Intake 480 ml  Output 1525 ml  Net -1045 ml     Physical Exam: Vital Signs Blood pressure 118/69, pulse 87, temperature 98.3 F (36.8 C), temperature source Oral, resp. rate 18, SpO2 100 %. Constitutional: No distress . Vital signs reviewed. Playing therapy games with PT standing, NAD HENT: Atraumatic. Large B/L neck /throat mass Eyes: conjugate gaze Cardiovascular: RRR Respiratory: CTA B/L GI: soft, NT; ND, (+)BS; hypoactive Skin: Warm and dry.  Intact. Psych: Normal mood.  Normal behavior. Musc: No edema in extremities.  No tenderness in extremities. Neuro:  Alert and oriented Motor: 4+/5 throughout. Ambulated to and from gym and down the stairs and outside yesterday.   Assessment/Plan: 1. 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  which require 3+ hours per day of interdisciplinary therapy in a comprehensive inpatient rehab setting.  Physiatrist is providing close team supervision and 24 hour management of active medical problems listed below.  Physiatrist and rehab team continue to assess barriers to discharge/monitor patient progress toward functional and medical goals  Care Tool:  Bathing    Body parts bathed by patient: Left upper leg, Right lower  leg, Left lower leg, Face, Right upper leg, Buttocks, Front perineal area, Abdomen, Chest, Left arm, Right arm         Bathing assist Assist Level: Contact Guard/Touching assist     Upper Body Dressing/Undressing Upper body dressing   What is the patient wearing?: Pull over shirt    Upper body assist Assist Level: Contact Guard/Touching assist    Lower Body Dressing/Undressing Lower body dressing      What is the patient wearing?: Pants     Lower body assist Assist for lower body dressing: Contact Guard/Touching assist     Toileting Toileting Toileting Activity did not occur (Clothing management and hygiene only): N/A (no void or bm)  Toileting assist Assist for toileting: Supervision/Verbal cueing Assistive Device Comment: urinal   Transfers Chair/bed transfer  Transfers assist  Chair/bed transfer activity did not occur: Safety/medical concerns  Chair/bed transfer assist level: Supervision/Verbal cueing     Locomotion Ambulation   Ambulation assist      Assist level: Supervision/Verbal cueing Assistive device: Cane-straight Max distance: 150'   Walk 10 feet activity   Assist     Assist level: Supervision/Verbal cueing Assistive device: Cane-straight   Walk 50 feet activity   Assist    Assist level: Supervision/Verbal cueing Assistive device: Cane-straight    Walk 150 feet activity   Assist    Assist level: Supervision/Verbal cueing Assistive device: Cane-straight    Walk 10 feet on uneven surface  activity   Assist     Assist level: Minimal Assistance - Patient > 75%     Wheelchair     Assist Will patient use  wheelchair at discharge?: No             Wheelchair 50 feet with 2 turns activity    Assist            Wheelchair 150 feet activity     Assist          Blood pressure 118/69, pulse 87, temperature 98.3 F (36.8 C), temperature source Oral, resp. rate 18, SpO2 100 %.  Medical Problem  List and Plan: 1.  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  Oncology.Radiation Oncology services follow-up  Continue CR  Supervision goals in community; has the support of sister and friend. Balance continues to be impaired.  2. -DVT/anticoagulation. CT 3/2 showed multiple small pulmonary nodules scattered in lungs bilaterally and multiple segmental and subsegmental filling defect in pulmonary arteries compatible with PE.   Plan is to begin Xarelto 05/23/2019 and was discussed with neurosurgery Dr. Trenton Gammon. Started this morning.              -antiplatelet therapy: N/A 3. Pain Management: Oxycodone as needed as well as Valium as needed for muscle spasms.   Controlled on 3/8, 3/9, 3/10, 3/11, 3/12.   3/13- only has tingling in legs- con't meds 4. Mood: Provide emotional support             -antipsychotic agents: N/A 5. Neuropsych: This patient is capable of making decisions on his own behalf. 6. Skin/Wound Care: Routine skin checks 7. Fluids/Electrolytes/Nutrition: Routine in and outs  8.  Acute blood loss anemia.    Hemoglobin 7.8 on 3/8, 8.1 on 3/10.   Continue to monitor 9.  Constipation.  Laxative assistance. Moving bowels regularly.  10.  Tobacco abuse.  Counseling 11.  Steroid-induced hyperglycemia  Continue to monitor  3/12: Initiated steroid taper yesterday.  12.  Hypoalbuminemia  Supplement initiated on 3/8 13.  Leukocytosis-likely steroid-induced  Afebrile  WBCs 22.8 on 3/8. Trended down to 19.1 on 3/10.  14. DC on Tuesday March 16th. Touched base with Auria yesterday regarding sending medical records for short disability and she said she would follow-up with him regarding this. He only has a few steps to navigate at home.   LOS: 7 days A FACE TO FACE EVALUATION WAS PERFORMED  Tyrone Washington 05/24/2019, 4:22 PM

## 2019-05-24 NOTE — Progress Notes (Signed)
Occupational Therapy Session Note  Patient Details  Name: Tyrone Washington MRN: [id number] Date of Birth: [birth year]  Today's Date: 05/24/2019 OT Individual Time: [id number] OT Individual Time Calculation (min): 55 min    Short Term Goals: Week 1:  OT Short Term Goal 1 (Week 1): STGs=LTGs due to ELOS  Skilled Therapeutic Interventions/Progress Updates:    1:1. Pt reporting no pain and agreeable to OT. Pt declines bathing and dressing stating he washed off this morning. Pt completes ambulation with SPC to all tx areas with VC for looking ahead. Pt completes standing ball toss to rebounder in various standing postures: feet hips with, alternating foot forward, and narrow BOS. Pt able to maintain balance standing during activity with CS and increased ant/post sway in staggered stance. Pt completes pillbox test within 5 min 37 sec and no errors pt self selects good strategies to minimize errors however pt drops pills onto floor. Edu re setting up towel on surface of table under all items to decrease risk of pills falling onto floor. Pt completes 1 standing game of connect 4 on airex pad for balance and CGA for stepping onto/off of pad. Exited session with pt seated in bed, reviewing theraband HEP, exit alarm on and call light in reach  Therapy Documentation Precautions:  Precautions Precautions: Fall Restrictions Weight Bearing Restrictions: No General:   Vital Signs: Therapy Vitals Temp: 97.8 F (36.6 C) Temp Source: Oral Pulse Rate: 76 Resp: 18 BP: (!) 102/58 Patient Position (if appropriate): Sitting Oxygen Therapy SpO2: 100 % O2 Device: Room Air Pain:   ADL: ADL Eating: Not assessed Grooming: Setup Where Assessed-Grooming: Edge of bed Upper Body Bathing: Setup Where Assessed-Upper Body Bathing: Edge of bed Lower Body Bathing: Contact guard Where Assessed-Lower Body Bathing: Edge of bed Upper Body Dressing: Setup Where Assessed-Upper Body Dressing: Edge of bed Lower  Body Dressing: Contact guard Where Assessed-Lower Body Dressing: Edge of bed Toileting: Not assessed Toilet Transfer: Minimal assistance Toilet Transfer Method: Ambulating(without device) Tub/Shower Transfer: Minimal assistance Tub/Shower Transfer Method: Ambulating(without device) Social research officer, government: (without device) Social research officer, government Method: Ambulating ADL Comments: Bathing tasks were simulated during session Vision   Perception    Praxis   Exercises:   Other Treatments:     Therapy/Group: Individual Therapy  Tonny Branch 05/24/2019, 6:45 AM

## 2019-05-25 NOTE — Progress Notes (Signed)
[facility] PHYSICAL MEDICINE & REHABILITATION PROGRESS NOTE   Subjective/Complaints:  Pt reports doing great; no issues- going home Tuesday.     ROS: Pt denies SOB, abd pain, CP, N/V/C/D, and vision changes   Objective:   No results found. Recent Labs    05/23/19 0533  WBC 15.6*  HGB 8.7*  HCT 27.3*  PLT 279   No results for input(s): NA, K, CL, CO2, GLUCOSE, BUN, CREATININE, CALCIUM in the last 72 hours.  Intake/Output Summary (Last 24 hours) at 05/25/2019 1324 Last data filed at 05/25/2019 1309 Gross per 24 hour  Intake 910 ml  Output 2100 ml  Net -1190 ml     Physical Exam: Vital Signs Blood pressure 115/79, pulse 72, temperature 97.9 F (36.6 C), resp. rate 18, weight 74.9 kg, SpO2 100 %. Constitutional: Pt sitting up in bed; appears comfortable, NAD HENT: Atraumatic. Large B/L throat anterior neck mass Eyes: conjugate gaze Cardiovascular: RRR< no MR/G, no JVD Respiratory: CTA B/L- no W/R/R- good air movement GI: Soft, NT, ND, (+)BS  Skin: Warm and dry.  Intact. Psych: bright affect Musc: No edema in extremities.  No tenderness in extremities. Neuro:  Alert and oriented Motor: 4+/5 throughout. Ambulated to and from gym and down the stairs and outside yesterday.   Assessment/Plan: 1. 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  which require 3+ hours per day of interdisciplinary therapy in a comprehensive inpatient rehab setting.  Physiatrist is providing close team supervision and 24 hour management of active medical problems listed below.  Physiatrist and rehab team continue to assess barriers to discharge/monitor patient progress toward functional and medical goals  Care Tool:  Bathing    Body parts bathed by patient: Left upper leg, Right lower leg, Left lower leg, Face, Right upper leg, Buttocks, Front perineal  area, Abdomen, Chest, Left arm, Right arm         Bathing assist Assist Level: Contact Guard/Touching assist     Upper Body Dressing/Undressing Upper body dressing   What is the patient wearing?: Pull over shirt    Upper body assist Assist Level: Contact Guard/Touching assist    Lower Body Dressing/Undressing Lower body dressing      What is the patient wearing?: Pants     Lower body assist Assist for lower body dressing: Contact Guard/Touching assist     Toileting Toileting Toileting Activity did not occur (Clothing management and hygiene only): N/A (no void or bm)  Toileting assist Assist for toileting: Supervision/Verbal cueing Assistive Device Comment: urinal   Transfers Chair/bed transfer  Transfers assist  Chair/bed transfer activity did not occur: Safety/medical concerns  Chair/bed transfer assist level: Supervision/Verbal cueing     Locomotion Ambulation   Ambulation assist      Assist level: Supervision/Verbal cueing Assistive device: Cane-straight Max distance: 150'   Walk 10 feet activity   Assist     Assist level: Supervision/Verbal cueing Assistive device: Cane-straight   Walk 50 feet activity   Assist    Assist level: Supervision/Verbal cueing Assistive device: Cane-straight    Walk 150 feet activity   Assist    Assist level: Supervision/Verbal cueing Assistive device: Cane-straight    Walk 10 feet on uneven surface  activity   Assist     Assist level: Minimal Assistance - Patient > 75%     Wheelchair     Assist Will patient use wheelchair at discharge?: No  Wheelchair 50 feet with 2 turns activity    Assist            Wheelchair 150 feet activity     Assist          Blood pressure 115/79, pulse 72, temperature 97.9 F (36.6 C), resp. rate 18, weight 74.9 kg, SpO2 100 %.  Medical Problem List and Plan: 1.  Thoracic myelopathy secondary to T10 metastatic spinal  tumor.S/P T9-10 laminectomy for epidural tumor with left T9 transpedicular resection 05/13/2019.  Oncology.Radiation Oncology services follow-up  Continue CR  Supervision goals in community; has the support of sister and friend. Balance continues to be impaired.  2. -DVT/anticoagulation. CT 3/2 showed multiple small pulmonary nodules scattered in lungs bilaterally and multiple segmental and subsegmental filling defect in pulmonary arteries compatible with PE.   Plan is to begin Xarelto 05/23/2019 and was discussed with neurosurgery Dr. Trenton Gammon. Started this morning.              -antiplatelet therapy: N/A 3. Pain Management: Oxycodone as needed as well as Valium as needed for muscle spasms.   Controlled on 3/8, 3/9, 3/10, 3/11, 3/12.   3/13- only has tingling in legs- con't meds 4. Mood: Provide emotional support             -antipsychotic agents: N/A 5. Neuropsych: This patient is capable of making decisions on his own behalf. 6. Skin/Wound Care: Routine skin checks 7. Fluids/Electrolytes/Nutrition: Routine in and outs  8.  Acute blood loss anemia.    Hemoglobin 7.8 on 3/8, 8.1 on 3/10.   Continue to monitor 9.  Constipation.  Laxative assistance. Moving bowels regularly.  10.  Tobacco abuse.  Counseling 11.  Steroid-induced hyperglycemia  Continue to monitor  3/12: Initiated steroid taper yesterday.  3/15- will check BMP and CBC on 3/15 before d/c tom make sure doing well  12.  Hypoalbuminemia  Supplement initiated on 3/8 13.  Leukocytosis-likely steroid-induced  Afebrile  WBCs 22.8 on 3/8. Trended down to 19.1 on 3/10.  3/14- was 15k- will recheck 1 more time prior to d/c just to make sure heading downwards  14. DC on Tuesday March 16th. Touched base with Auria yesterday regarding sending medical records for short disability and she said she would follow-up with him regarding this. He only has a few steps to navigate at home.  3/14- if Dr Naaman Plummer unable to f/u up, I'm happy to see Mr  Stones in my outpatient clinic. Will let team know.    LOS: 8 days A FACE TO FACE EVALUATION WAS PERFORMED  Treniyah Lynn 05/25/2019, 1:24 PM

## 2019-05-25 NOTE — Discharge Summary (Signed)
Physician Discharge Summary  Patient ID: Tyrone Washington MRN: [id number] DOB/AGE: 04-29-1955 64 y.o.  Admit date: 05/17/2019 Discharge date: 05/27/2019  Discharge Diagnoses:  Principal Problem:   Myelopathy ([place]) Active Problems:   Leucocytosis   Hypoalbuminemia due to protein-calorie malnutrition (HCC)   Steroid-induced hyperglycemia   Acute blood loss anemia DVT prophylaxis Acute blood loss anemia Tobacco abuse Constipation T10 metastatic spinal tumor  Discharged Condition: Stable  Significant Diagnostic Studies: DG THORACOLUMABAR SPINE  Result Date: 05/13/2019 CLINICAL DATA:  T10 laminectomy. EXAM: THORACOLUMBAR SPINE 1V COMPARISON:  May 12, 2019. FINDINGS: Two intraoperative cross-table lateral projections were obtained of the lumbar spine. The first image demonstrates surgical probe directed toward T12 level. The second image demonstrates surgical retractor posterior to T10-11 disc space. IMPRESSION: Surgical localization as described above. Electronically Signed   By: Marijo Conception M.D.   On: 05/13/2019 16:25   CT CHEST W CONTRAST  Result Date: 05/13/2019 CLINICAL DATA:  64 year old male with history of cancer of unknown primary. EXAM: CT CHEST, ABDOMEN, AND PELVIS WITH CONTRAST TECHNIQUE: Multidetector CT imaging of the chest, abdomen and pelvis was performed following the standard protocol during bolus administration of intravenous contrast. CONTRAST:  187mL OMNIPAQUE IOHEXOL 300 MG/ML  SOLN COMPARISON:  No priors. FINDINGS: CT CHEST FINDINGS Cardiovascular: Heart size is normal. There is no significant pericardial fluid, thickening or pericardial calcification. No atherosclerotic calcifications in the thoracic aorta or the coronary arteries. Filling defects are present within segmental and subsegmental sized pulmonary artery branches in the lungs bilaterally, indicative of pulmonary embolism. The largest of these is in the right lower lobe (axial image 39 of series 3). At this  time, these appear to be nonocclusive. Mediastinum/Nodes: Large heterogeneously enhancing thyroid mass incompletely imaged at the level of the thoracic inlet measuring at least 11.8 x 9.8 cm (axial image 1 of series 3). No pathologically enlarged mediastinal or hilar lymph nodes. Esophagus is unremarkable in appearance. No axillary lymphadenopathy. Lungs/Pleura: Multiple small pulmonary nodules are scattered throughout the lungs bilaterally, highly concerning for widespread metastatic disease, largest of which measure up to 9 mm in the left lower lobe (axial image 122 of series 5, and 11 mm in the right lower lobe (axial image 121 of series 5). Small right pleural effusion which is partially loculated laterally, but predominantly layers dependently. No left pleural effusion. No acute consolidative airspace disease. Musculoskeletal: Lytic lesions are noted in the thoracic spine involving both T7 and T10. The largest lesion involves the posterior aspect of the T10 vertebral body, extending through the pedicle into the lateral and posterior elements, as well as involving the adjacent head and neck of the left tenth rib, measuring 5.1 x 3.8 cm (axial image 102 of series 5), encroaching upon and significantly narrowing the central spinal canal. The smaller lesion is at T7 measuring approximately 3.9 x 2.7 cm also involving the left side of the vertebral body extending into the pedicle and left transverse process, as well as the left paraspinal soft tissues with minimal encroachment upon the central spinal canal. Both of these lesions demonstrate extensive enhancement. CT ABDOMEN PELVIS FINDINGS Hepatobiliary: No suspicious cystic or solid hepatic lesions. No intra or extrahepatic biliary ductal dilatation. Gallbladder is normal in appearance. Pancreas: No pancreatic mass. No pancreatic ductal dilatation. No pancreatic or peripancreatic fluid collections or inflammatory changes. Spleen: Unremarkable. Adrenals/Urinary  Tract: Subcentimeter low-attenuation lesion in the interpolar region of the right kidney, too small to characterize, but statistically likely to represent a tiny cyst. Left kidney  and bilateral adrenal glands are normal in appearance. No hydroureteronephrosis. Urinary bladder is normal in appearance. Stomach/Bowel: Normal appearance of the stomach. No pathologic dilatation of small bowel or colon. Normal appendix. Vascular/Lymphatic: Atherosclerotic calcifications in the pelvic vasculature. No aneurysm or dissection noted in the abdominal or pelvic vasculature. No lymphadenopathy noted in the abdomen or pelvis. Reproductive: Prostate gland and seminal vesicles are unremarkable. Other: No significant volume of ascites.  No pneumoperitoneum. Musculoskeletal: There are no aggressive appearing lytic or blastic lesions noted in the visualized portions of the skeleton. IMPRESSION: 1. Widespread metastatic disease to the lungs, small right pleural effusion which is partially loculated and may be malignant, and metastatic lesions in the thoracic spine, as detailed above. A primary source is uncertain, however, the lesions of the thoracic spine demonstrate avid enhancement, which is very similar to the enhancement characteristics of the large thyroid mass. 2. Multiple segmental and subsegmental sized filling defects in the pulmonary arteries bilaterally, compatible with pulmonary embolism. 3. Large heterogeneously enhancing thyroid mass incompletely imaged. Although this may simply represent a thyroid goiter, the possibility of primary thyroid neoplasm should be considered, particularly in light of the similar enhancement characteristics of the spinal lesions (discussed above). 4. Additional incidental findings, as above. Electronically Signed   By: Vinnie Langton M.D.   On: 05/13/2019 08:58   MR Cervical Spine W or Wo Contrast  Result Date: 05/12/2019 CLINICAL DATA:  Bilateral lower extremity weakness, abnormal lumbar  spine EXAM: MRI CERVICAL SPINE WITHOUT AND WITH CONTRAST TECHNIQUE: Multiplanar and multiecho pulse sequences of the cervical spine, to include the craniocervical junction and cervicothoracic junction, were obtained without and with intravenous contrast. CONTRAST:  12mL GADAVIST GADOBUTROL 1 MMOL/ML IV SOLN COMPARISON:  None. FINDINGS: Alignment: Anteroposterior alignment is maintained. Vertebrae: Vertebral body heights are preserved. There is no substantial marrow edema or suspicious osseous lesion. Cord: No abnormal signal. Posterior Fossa, vertebral arteries, paraspinal tissues: Markedly enlarged and heterogeneous thyroid is incompletely evaluated. Disc levels: Minor disc bulges are present. Mild facet and uncovertebral hypertrophy. There is no high-grade degenerative stenosis. IMPRESSION: No evidence cervical spine metastatic disease. Electronically Signed   By: Macy Mis M.D.   On: 05/12/2019 21:50   MR THORACIC SPINE W WO CONTRAST  Result Date: 05/12/2019 CLINICAL DATA:  Lower extremity weakness and numbness, abnormal lumbar spine EXAM: MRI THORACIC WITHOUT AND WITH CONTRAST TECHNIQUE: Multiplanar and multiecho pulse sequences of the thoracic spine were obtained without and with intravenous contrast. CONTRAST:  70mL GADAVIST GADOBUTROL 1 MMOL/ML IV SOLN COMPARISON:  None. FINDINGS: Motion artifact is present. Alignment: Thoracic kyphosis is preserved. No substantial anteroposterior listhesis. Vertebrae: There is abnormal STIR hyperintensity and enhancement at the T7 level extending into the posterior elements on the left. Additional involvement of the inferior left aspect of the T6 vertebral body. There is extraosseous extension at T7 including the ventral and left lateral spinal canal. Effacement of the left lateral subarachnoid space without cord compression. There is also effacement of the left T7-T8 foramen and partial effacement of the left T6-T7 foramen. Abnormal STIR hyperintensity and enhancement  at the T10 level extending into the left much greater than right posterior elements with involvement of the left tenth rib. Additional involvement of the left aspect of the T9 vertebral body and posterior elements. Significant extraosseous extension at inferior T9 and T10 including left ventral, lateral, and dorsal epidural extension with cord displacement and compression. There is likely abnormal cord signal. Effacement of the left T9-T10 and T10-T11 foramina. Cord:  As  above.  Otherwise unremarkable. Paraspinal and other soft tissues: Partially imaged significantly enlarged and heterogeneous thyroid is incompletely evaluated. Disc levels: Mild degenerative disc disease and facet arthropathy. There is no high-grade degenerative stenosis. IMPRESSION: Abnormal signal at T7 and T10 with additional less pronounced adjacent involvement at T6 and T9 consistent with metastatic disease. Extraosseous extension is present at both levels as detailed above. Most notably, there is severe canal stenosis with cord compression and abnormal cord signal at the inferior T9 and T10 level. These results were called by telephone at the time of interpretation on 05/12/2019 at 9:43 pm to provider Madilyn Hook, who verbally acknowledged these results. Electronically Signed   By: Macy Mis M.D.   On: 05/12/2019 21:44   MR Lumbar Spine W Wo Contrast  Result Date: 05/12/2019 CLINICAL DATA:  Lumbar radiculopathy, no red flags. Additional history provided: Numbness of the feet and legs which began October 2020. EXAM: MRI LUMBAR SPINE WITHOUT AND WITH CONTRAST TECHNIQUE: Multiplanar and multiecho pulse sequences of the lumbar spine were obtained without and with intravenous contrast. CONTRAST:  53mL GADAVIST GADOBUTROL 1 MMOL/ML IV SOLN COMPARISON:  No pertinent prior studies available for comparison. FINDINGS: Segmentation: For the purposes of this dictation, five lumbar vertebrae are assumed and the caudal most well-formed intervertebral  disc is designated L5-S1. Alignment: Straightening of the expected lumbar lordosis. No significant spondylolisthesis. Vertebrae: Vertebral body height is maintained. There is a 13 mm lesion within the posteroinferior aspect of the S1 sacral segment which demonstrates T1 hypointensity, T2/STIR hyperintensity and avid enhancement (series 3, image 7) (series 7, image 7). Additional 15 mm enhancing lesion within the right sacral ala (series 6, image 35) (series 8, image 35). Mild L5-S1 degenerative endplate irregularity with mixed degenerative endplate marrow signal and a small Schmorl node within the S1 superior endplate. Conus medullaris and cauda equina: Conus extends to the L1-L2 level. No signal abnormality within the visualized distal spinal cord. No abnormal enhancement of the visualized distal spinal cord or cauda equina nerve roots. Paraspinal and other soft tissues: No abnormality identified within included portions of the abdomen/retroperitoneum. Paraspinal soft tissues within normal limits. Disc levels: Moderate L5-S1 disc degeneration. Mild-to-moderate disc degeneration is also present at L3-L4 and L4-L5. T12-L1: This level is not included on axial imaging. No disc herniation. No significant canal or foraminal stenosis. L1-L2: No disc herniation. No significant canal or foraminal stenosis. Small posteriorly projecting synovial facet cyst on the left. L2-L3: No disc herniation. No significant canal or foraminal stenosis. L3-L4: Disc bulge with endplate spurring. Mild facet arthrosis/ligamentum flavum hypertrophy. Mild relative spinal canal narrowing without nerve root impingement. Mild bilateral neural foraminal narrowing. L4-L5: Disc bulge with endplate spurring. Mild facet arthrosis/ligamentum flavum hypertrophy. Mild relative spinal canal narrowing without nerve root impingement. Mild bilateral neural foraminal narrowing. L5-S1: Disc bulge with endplate spurring. Mild facet arthrosis. No significant  spinal canal stenosis. Mild left neural foraminal narrowing. IMPRESSION: Two enhancing osseous lesions, one measuring 13 mm within the posterior inferior S1 sacral segment, and the other measuring 15 mm within the right sacral ala. Findings are highly suspicious for osseous metastatic disease. Given the provided history of numbness from the waist down, consider contrast-enhanced MRI of the thoracic spine to assess for metastatic disease and cord compression at these levels. Lumbar spondylosis as detailed and greatest at L5-S1. No more than mild spinal canal or neural foraminal narrowing within the lumbar spine. Electronically Signed   By: Kellie Simmering DO   On: 05/12/2019 13:27  CT ABDOMEN PELVIS W CONTRAST  Result Date: 05/13/2019 CLINICAL DATA:  64 year old male with history of cancer of unknown primary. EXAM: CT CHEST, ABDOMEN, AND PELVIS WITH CONTRAST TECHNIQUE: Multidetector CT imaging of the chest, abdomen and pelvis was performed following the standard protocol during bolus administration of intravenous contrast. CONTRAST:  137mL OMNIPAQUE IOHEXOL 300 MG/ML  SOLN COMPARISON:  No priors. FINDINGS: CT CHEST FINDINGS Cardiovascular: Heart size is normal. There is no significant pericardial fluid, thickening or pericardial calcification. No atherosclerotic calcifications in the thoracic aorta or the coronary arteries. Filling defects are present within segmental and subsegmental sized pulmonary artery branches in the lungs bilaterally, indicative of pulmonary embolism. The largest of these is in the right lower lobe (axial image 39 of series 3). At this time, these appear to be nonocclusive. Mediastinum/Nodes: Large heterogeneously enhancing thyroid mass incompletely imaged at the level of the thoracic inlet measuring at least 11.8 x 9.8 cm (axial image 1 of series 3). No pathologically enlarged mediastinal or hilar lymph nodes. Esophagus is unremarkable in appearance. No axillary lymphadenopathy.  Lungs/Pleura: Multiple small pulmonary nodules are scattered throughout the lungs bilaterally, highly concerning for widespread metastatic disease, largest of which measure up to 9 mm in the left lower lobe (axial image 122 of series 5, and 11 mm in the right lower lobe (axial image 121 of series 5). Small right pleural effusion which is partially loculated laterally, but predominantly layers dependently. No left pleural effusion. No acute consolidative airspace disease. Musculoskeletal: Lytic lesions are noted in the thoracic spine involving both T7 and T10. The largest lesion involves the posterior aspect of the T10 vertebral body, extending through the pedicle into the lateral and posterior elements, as well as involving the adjacent head and neck of the left tenth rib, measuring 5.1 x 3.8 cm (axial image 102 of series 5), encroaching upon and significantly narrowing the central spinal canal. The smaller lesion is at T7 measuring approximately 3.9 x 2.7 cm also involving the left side of the vertebral body extending into the pedicle and left transverse process, as well as the left paraspinal soft tissues with minimal encroachment upon the central spinal canal. Both of these lesions demonstrate extensive enhancement. CT ABDOMEN PELVIS FINDINGS Hepatobiliary: No suspicious cystic or solid hepatic lesions. No intra or extrahepatic biliary ductal dilatation. Gallbladder is normal in appearance. Pancreas: No pancreatic mass. No pancreatic ductal dilatation. No pancreatic or peripancreatic fluid collections or inflammatory changes. Spleen: Unremarkable. Adrenals/Urinary Tract: Subcentimeter low-attenuation lesion in the interpolar region of the right kidney, too small to characterize, but statistically likely to represent a tiny cyst. Left kidney and bilateral adrenal glands are normal in appearance. No hydroureteronephrosis. Urinary bladder is normal in appearance. Stomach/Bowel: Normal appearance of the stomach. No  pathologic dilatation of small bowel or colon. Normal appendix. Vascular/Lymphatic: Atherosclerotic calcifications in the pelvic vasculature. No aneurysm or dissection noted in the abdominal or pelvic vasculature. No lymphadenopathy noted in the abdomen or pelvis. Reproductive: Prostate gland and seminal vesicles are unremarkable. Other: No significant volume of ascites.  No pneumoperitoneum. Musculoskeletal: There are no aggressive appearing lytic or blastic lesions noted in the visualized portions of the skeleton. IMPRESSION: 1. Widespread metastatic disease to the lungs, small right pleural effusion which is partially loculated and may be malignant, and metastatic lesions in the thoracic spine, as detailed above. A primary source is uncertain, however, the lesions of the thoracic spine demonstrate avid enhancement, which is very similar to the enhancement characteristics of the large thyroid mass. 2.  Multiple segmental and subsegmental sized filling defects in the pulmonary arteries bilaterally, compatible with pulmonary embolism. 3. Large heterogeneously enhancing thyroid mass incompletely imaged. Although this may simply represent a thyroid goiter, the possibility of primary thyroid neoplasm should be considered, particularly in light of the similar enhancement characteristics of the spinal lesions (discussed above). 4. Additional incidental findings, as above. Electronically Signed   By: Vinnie Langton M.D.   On: 05/13/2019 08:58    Labs:  Basic Metabolic Panel: Recent Labs  Lab 05/26/19 0532  NA 135  K 4.9  CL 99  CO2 25  GLUCOSE 103*  BUN 24*  CREATININE 0.98  CALCIUM 8.3*    CBC: Recent Labs  Lab 05/21/19 0505 05/23/19 0533 05/26/19 0532  WBC 19.1* 15.6* 18.7*  NEUTROABS  --   --  17.6*  HGB 8.1* 8.7* 9.1*  HCT 25.2* 27.3* 28.8*  MCV 94.7 95.8 95.0  PLT 294 279 272    CBG: Recent Labs  Lab 05/23/19 1154 05/23/19 1645  GLUCAP 97 108*   Family history.  Father with  CAD, CVA, negative for colon cancer esophageal cancer rectal cancer stomach cancer  Brief HPI:   Tyrone Washington is a 64 y.o. right-handed male with history of hyperlipidemia tobacco abuse as well as 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  Per chart review lives alone 1 level home independent with assistive device prior to admission.  Presented 05/13/2019 with reported multiple falls as well as 5-month history of progressive weakness lower extremities and sensory loss.  Denies any bowel or bladder disturbances.  Admission chemistries unremarkable.  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  Vertebral body and right side articular masses however were wide spared.  He does have some metastatic disease thoracic spine T7 level that was noncompressive as well as the sacrum.  Staging CT scan obtained March 2 included CT of chest abdomen pelvis showed multiple small pulmonary nodules scattered in the lungs bilaterally with the largest of which measuring 9 mm in the left lower lobe at 11 mm in the right lower lobe.  He was noted to have lytic lesions in the thoracic spine including T7 and T10.  Multiple segmental and subsegmental filling defect in the pulmonary arteries compatible with pulmonary embolism.  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 Annette Stable.  Plan was to begin Xarelto for pulmonary emboli postop day 10 beginning 05/23/2019.  Oncology services consulted Dr. Alen Blew 05/14/2019 radiation oncology services for advanced malignancies with chart reviewed showing recommendations were to follow-up outpatient cancer center with pathology report pending.   Patient was admitted for a comprehensive rehab program.   Hospital Course: Tyrone Washington was admitted to rehab 05/17/2019 for inpatient therapies to consist of PT, ST and OT at least three hours five days a week. Past admission physiatrist, therapy team and rehab RN have worked together to provide customized collaborative inpatient rehab.  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  He would follow-up outpatient neurosurgery Dr. Annette Stable as well as oncology radiation services for plan of care.  DVT prophylaxis CT 05/13/2019 showed multiple small pulmonary nodules scattered  in lungs bilaterally and multiple segmental and subsegmental filling defect in pulmonary arteries compatible with PE Xarelto initiated 05/23/2019 for pulmonary emboli.  Pain management oxycodone Valium as needed.  Patient with history of tobacco abuse receiving counsel regards to cessation of nicotine products.  He did have some steroid-induced hyperglycemia improved with tapering steroid.  Acute blood loss anemia with no bleeding episodes and asymptomatic.  Bouts of constipation resolved with laxative assistance.   Blood pressures were monitored on TID basis and controlled  He/ is continent of bowel and bladder.  He/ has made gains during rehab stay and is attending therapies  He/ will continue to receive follow up therapies   after discharge  Rehab course: During patient's stay in rehab weekly team conferences were held to monitor patient's progress, set goals and discuss barriers to discharge. At admission, patient required minimal assist on her feet rolling walker, minimal assist sit to stand, minimal assist rolling.  Set up min guard upper body bathing minimal assist lower body bathing set up a min guard upper body dressing minimal assist lower body dressing minimal assist toilet transfers  Physical exam.  Blood  pressure 128/70 pulse 80 temperature 98 respirations 18 oxygen saturation 96% room air Constitutional well-developed well-nourished HEENT. Head.  Normocephalic and atraumatic Eyes.  Pupils round and reactive to light no discharge without nystagmus Neck.  Supple nontender no JVD without thyromegaly Cardiac regular rate rhythm without any extra sounds or murmur heard Respiratory effort normal no respiratory distress without wheeze GI.  Soft nontender positive bowel sounds without rebound Skin.  Incision covered honeycomb dressing Steri-Strips clean dry and intact Neurological.  Cognitively appropriate normal insight memory and awareness.  Cranial nerves II through XII intact.  Sensory exam normal.  Reflexes 2+ out of 5 in all fours.  Fine motor skills intact motor function grossly graded 4 out of 5  He/  has had improvement in activity tolerance, balance, postural control as well as ability to compensate for deficits. He/ has had improvement in functional use RUE/LUE  and RLE/LLE as well as improvement in awareness.  Independent bed mobility ambulates around the room supervision using a straight point cane, perform toilet transfers and self-care task and standing at sink with supervision as well.  Donned shoes independently edge of bed prior to leaving his room.  Ambulated to and from therapy gym around the unit supervision straight point cane.  Sessions focused on household environment functional ability all activities performed with supervision.  Practice for transfers verbal cues negotiated stairs straight point cane.  Practice for transfers to the low surfaces and carrying coffee cup full of water supervision.  Gather belongings for activities day living and homemaking.  Full family teaching completed plan discharge to home       Disposition: Discharge to home    Diet: Regular  Special Instructions: No driving smoking or alcohol 1.  Tylenol as needed 2.  Decadron taper as indicated 3.   Valium 5 to 10 mg every 6 hours as needed muscle spasms 4.  Oxycodone 10 mg every 3 hours as needed pain 5.  Xarelto 15 mg p.o. twice daily x21 days total 6.  Xarelto 20 mg daily beginning 06/13/2019  Discharge Instructions    Ambulatory referral to Physical Medicine Rehab   Complete by: As directed    Moderate complexity follow-up 1 to 2 weeks thoracic myelopathy T10 metastatic spinal tumor      Follow-up Information    Meredith Staggers, MD Follow up.  Specialty: Physical Medicine and Rehabilitation Why: Office to call for appointment Contact information: [address] [place] [address] [phone]        Wyatt Portela, MD Follow up.   Specialty: Oncology Why: Call for appointment Contact information: [address] [place] [address] [id number]        Earnie Larsson, MD Follow up.   Specialty: Neurosurgery Why: Call for appointment Contact information: [address] [place] [address] [phone]           Signed: Lavon Paganini [place] 05/27/2019, 5:15 AM

## 2019-05-26 ENCOUNTER — Inpatient Hospital Stay (HOSPITAL_COMMUNITY): Payer: BC Managed Care – PPO | Admitting: Occupational Therapy

## 2019-05-26 ENCOUNTER — Inpatient Hospital Stay (HOSPITAL_COMMUNITY): Payer: BC Managed Care – PPO | Admitting: Physical Therapy

## 2019-05-26 LAB — COMPREHENSIVE METABOLIC PANEL
ALT: 24 U/L (ref 0–44)
AST: 16 U/L (ref 15–41)
Albumin: 2.6 g/dL — ABNORMAL LOW (ref 3.5–5.0)
Alkaline Phosphatase: 48 U/L (ref 38–126)
Anion gap: 11 (ref 5–15)
BUN: 24 mg/dL — ABNORMAL HIGH (ref 8–23)
CO2: 25 mmol/L (ref 22–32)
Calcium: 8.3 mg/dL — ABNORMAL LOW (ref 8.9–10.3)
Chloride: 99 mmol/L (ref 98–111)
Creatinine, Ser: 0.98 mg/dL (ref 0.61–1.24)
GFR calc Af Amer: >60 mL/min (ref >60)
GFR calc non Af Amer: >60 mL/min (ref >60)
Glucose, Bld: 103 mg/dL — ABNORMAL HIGH (ref 70–99)
Potassium: 4.9 mmol/L (ref 3.5–5.1)
Sodium: 135 mmol/L (ref 135–145)
Total Bilirubin: 0.3 mg/dL (ref 0.3–1.2)
Total Protein: 5.1 g/dL — ABNORMAL LOW (ref 6.5–8.1)

## 2019-05-26 LAB — CBC WITH DIFFERENTIAL/PLATELET
Abs Immature Granulocytes: 0 10*3/uL (ref 0.00–0.07)
Basophils Absolute: 0 10*3/uL (ref 0.0–0.1)
Basophils Relative: 0 %
Eosinophils Absolute: 0 10*3/uL (ref 0.0–0.5)
Eosinophils Relative: 0 %
HCT: 28.8 % — ABNORMAL LOW (ref 39.0–52.0)
Hemoglobin: 9.1 g/dL — ABNORMAL LOW (ref 13.0–17.0)
Lymphocytes Relative: 3 %
Lymphs Abs: 0.6 10*3/uL — ABNORMAL LOW (ref 0.7–4.0)
MCH: 30 pg (ref 26.0–34.0)
MCHC: 31.6 g/dL (ref 30.0–36.0)
MCV: 95 fL (ref 80.0–100.0)
Monocytes Absolute: 0.6 10*3/uL (ref 0.1–1.0)
Monocytes Relative: 3 %
Neutro Abs: 17.6 10*3/uL — ABNORMAL HIGH (ref 1.7–7.7)
Neutrophils Relative %: 94 %
Platelets: 272 10*3/uL (ref 150–400)
RBC: 3.03 MIL/uL — ABNORMAL LOW (ref 4.22–5.81)
RDW: 17.7 % — ABNORMAL HIGH (ref 11.5–15.5)
WBC: 18.7 10*3/uL — ABNORMAL HIGH (ref 4.0–10.5)
nRBC: 0 /100 WBC
nRBC: 0.5 % — ABNORMAL HIGH (ref 0.0–0.2)

## 2019-05-26 MED ORDER — RIVAROXABAN 15 MG PO TABS: 15.0000 mg | ORAL_TABLET | Freq: Two times a day (BID) | ORAL | 0 refills | Status: DC

## 2019-05-26 MED ORDER — OXYCODONE HCL 10 MG PO TABS: 10.0000 mg | ORAL_TABLET | ORAL | 0 refills | Status: DC | PRN

## 2019-05-26 MED ORDER — RIVAROXABAN 20 MG PO TABS: 20.0000 mg | ORAL_TABLET | Freq: Every day | ORAL | 1 refills | Status: DC

## 2019-05-26 MED ORDER — PREDNISONE 1 MG PO TABS: ORAL_TABLET | ORAL | 0 refills | Status: DC

## 2019-05-26 MED ORDER — DIAZEPAM 5 MG PO TABS: 5.0000 mg | ORAL_TABLET | Freq: Four times a day (QID) | ORAL | 0 refills | Status: DC | PRN

## 2019-05-26 MED FILL — predniSONE 1 MG TABS: 1 | 7 days supply | Qty: 26 | Fill #0

## 2019-05-26 MED FILL — XARELTO 15 MG TABLET: 15 | 17 days supply | Qty: 34 | Fill #0

## 2019-05-26 MED FILL — diazePAM 5 MG TABS: 5 | 4 days supply | Qty: 30 | Fill #0

## 2019-05-26 MED FILL — oxyCODONE HCL 10 MG TABS: 10 | 4 days supply | Qty: 30 | Fill #0

## 2019-05-26 NOTE — Progress Notes (Signed)
Occupational Therapy Discharge Summary  Patient Details  Name: Tyrone Washington MRN: [id number] Date of Birth: 01/04/1956  Patient has met 9 of 9 long term goals due to improved activity tolerance, improved balance, postural control and improved coordination.  Patient to discharge at overall Modified Independent level.  Patient reports his sister can provide intermittent supervision at time of d/c.   All goals met.   Recommendation:  Patient will benefit from ongoing skilled OT services in home health setting to continue to advance functional skills in the area of iADL.  Equipment: TTB  Reasons for discharge: treatment goals met and discharge from hospital  Patient/family agrees with progress made and goals achieved: Yes  OT Discharge Vital Signs Therapy Vitals Temp: 97.9 F (36.6 C) Temp Source: Oral Pulse Rate: 88 Resp: 16 BP: 113/72 Patient Position (if appropriate): Sitting Oxygen Therapy SpO2: 100 % O2 Device: Room Air ADL ADL Eating: Independent Grooming: Modified independent Where Assessed-Grooming: Standing at sink Upper Body Bathing: Modified independent Where Assessed-Upper Body Bathing: Shower Lower Body Bathing: Modified independent Where Assessed-Lower Body Bathing: Shower Upper Body Dressing: Modified independent (Device) Where Assessed-Upper Body Dressing: Other (Comment)(standing near dresser) Lower Body Dressing: Modified independent Where Assessed-Lower Body Dressing: Chair Toileting: Modified independent Where Assessed-Toileting: Toilet Toilet Transfer: Modified independent Toilet Transfer Method: Ambulating(without device) Tub/Shower Transfer: Minimal assistance Tub/Shower Transfer Method: Ambulating(without device) Walk-In Shower Transfer: Modified independent Walk-In Shower Transfer Method: Ambulating ADL Comments: Bathing tasks were simulated during session Cognition Arousal/Alertness: Awake/alert Orientation Level: Oriented X4 Problem  Solving: Appears intact Safety/Judgment: Appears intact Sensation Coordination Finger Nose Finger Test: WNL bilaterally Motor  Motor Motor: Ataxia Motor - Discharge Observations: LE ataxia persists, improved since time of eval Mobility    Mod I ambulatory bathroom transfers using SPC Balance Balance Balance Assessed: Yes Dynamic Sitting Balance Dynamic Sitting - Level of Assistance: 7: Independent Dynamic Sitting - Balance Activities: Lateral lean/weight shifting;Forward lean/weight shifting;Reaching for objects(donning sneakers while sitting in recliner) Dynamic Standing Balance Dynamic Standing - Balance Support: During functional activity Dynamic Standing - Level of Assistance: 6: Modified independent (Device/Increase time) Dynamic Standing - Balance Activities: Reaching for objects;Reaching across midline;Forward lean/weight shifting Dynamic Standing - Comments: (toileting tasks) Extremity/Trunk Assessment RUE Assessment RUE Assessment: Within Functional Limits Active Range of Motion (AROM) Comments: WNL LUE Assessment LUE Assessment: Within Functional Limits Active Range of Motion (AROM) Comments: WNL   Michaela A Hoffman 05/26/2019, 3:54 PM  

## 2019-05-26 NOTE — Progress Notes (Signed)
Occupational Therapy Session Note  Patient Details  Name: Tyrone Washington MRN: [id number] Date of Birth: [birth year]  Today's Date: 05/26/2019 OT Individual Time: 1015-1111 and 1300-1410 OT Individual Time Calculation (min): 56 min and 70 min  Short Term Goals: Week 1:  OT Short Term Goal 1 (Week 1): STGs=LTGs due to ELOS  Skilled Therapeutic Interventions/Progress Updates:    Pt greeted on the toilet, finishing up BM void. He completed hygiene, clothing mgt, and handwashing at the sink after, using SPC for ambulation at Mod I level. After transferring to the recliner pt stated he had no pain. Already dressed and declining shower. Therefore pt started session by transferring to the TTB in the shower and simulating bathing tasks. After ambulating back to into the room once again using SPC he simulated dressing tasks sit<stand from his recliner. Per pt, he has been completing bathing/dressing tasks at Mod I level during the past few days. Next we reviewed his [place] for home use. Pt required min vcs for correct technique, tandem stepping was particularly challenging. Pt reported having several sturdy surfaces he would use at home to provide handheld support to maximize his safety during exercise engagement. He required several rest breaks due to fatigue. OT played meaningful music to enhance psychosocial health, taught him several seated LE exercises to complete in beat to music, focusing on improving ankle mobility. Pt appreciative. Placed a Mod I sign on the door and explained to pt ability to transfer in the room using [facility] without supervision from staff now. Pt agreeable. Notified NT. Pt remained in the recliner with call bell within reach.   2nd Session 1:1 tx (70 min) Pt greeted in bed with no c/o pain, agreeable to engage in tx off of unit. Started with toilet transfer and toileting using SPC at ambulatory level with Mod I. After handwashing pt retrieved a hoodie from his closet and put it  on at Mod I level. He was escorted via w/c to the atrium. Worked on community mobility, higher level balance, and engaging in IADL activity while adhering to covid protocols. He ambulated in the gift shop and [place], purchasing items from these places at Mod I level using [place]. He also ambulated outdoors, over uneven surfaces and transferred to a community bench. We discussed family and friends providing intermittent supervision at time of d/c as well as driving support to and from chemo treatments and when discharging from hospital. Pt is still coordinating who will pick him up tomorrow. OT advised selecting a friend with a vehicle that is easy to access and not too high in elevation. We also discussed using [facility] as a reliable option for chemo transport, notified SW who stated she will assist with filling out needed paperwork for this. At end of session pt returned to room via w/c and transferred to his recliner. Left him with all needs within reach. He reports having no further questions regarding d/c.      Therapy Documentation Precautions:  Precautions Precautions: Fall Restrictions Weight Bearing Restrictions: No ADL: ADL Eating: Not assessed Grooming: Setup Where Assessed-Grooming: Edge of bed Upper Body Bathing: Setup Where Assessed-Upper Body Bathing: Edge of bed Lower Body Bathing: Contact guard Where Assessed-Lower Body Bathing: Edge of bed Upper Body Dressing: Setup Where Assessed-Upper Body Dressing: Edge of bed Lower Body Dressing: Contact guard Where Assessed-Lower Body Dressing: Edge of bed Toileting: Not assessed Toilet Transfer: Minimal assistance Toilet Transfer Method: Ambulating(without device) Tub/Shower Transfer: Minimal assistance Tub/Shower Transfer Method: Ambulating(without device) Walk-In  Shower Transfer: (without device) Social research officer, government Method: Ambulating ADL Comments: Bathing tasks were simulated during session      Therapy/Group: Individual  Therapy  Mykel Mohl A Armentha Branagan 05/26/2019, 12:29 PM

## 2019-05-26 NOTE — Plan of Care (Signed)

## 2019-05-26 NOTE — Progress Notes (Addendum)
Social Work Patient ID: Tyrone Washington, male   DOB: Oct 18, 1955, 64 y.o.   MRN: [id number]   SW followed up with Tiffany/[facility]- referral declined.   SW waiting on follow-up with from [facility] [id number]).   Loralee Pacas, MSW, [place] Office: [phone] Cell: [phone] Fax: [phone]

## 2019-05-26 NOTE — Progress Notes (Signed)
[facility] PHYSICAL MEDICINE & REHABILITATION PROGRESS NOTE   Subjective/Complaints:  In good spirits. Feels a little numbness still in legs. Feels as if he's going to be ready for dc tomorrow  ROS: Patient denies fever, rash, sore throat, blurred vision, nausea, vomiting, diarrhea, cough, shortness of breath or chest pain, joint or back pain, headache, or mood change.   Objective:   No results found. Recent Labs    05/26/19 0532  WBC 18.7*  HGB 9.1*  HCT 28.8*  PLT 272   Recent Labs    05/26/19 0532  NA 135  K 4.9  CL 99  CO2 25  GLUCOSE 103*  BUN 24*  CREATININE 0.98  CALCIUM 8.3*    Intake/Output Summary (Last 24 hours) at 05/26/2019 1442 Last data filed at 05/26/2019 0900 Gross per 24 hour  Intake 440 ml  Output 1275 ml  Net -835 ml     Physical Exam: Vital Signs Blood pressure 113/72, pulse 88, temperature 97.9 F (36.6 C), resp. rate 16, weight 74.9 kg, SpO2 100 %. Constitutional: No distress . Vital signs reviewed. HEENT: EOMI, oral membranes moist Neck: large goiter Cardiovascular: RRR without murmur. No JVD    Respiratory/Chest: CTA Bilaterally without wheezes or rales. Normal effort    GI/Abdomen: BS +, non-tender, non-distended Ext: no clubbing, cyanosis, or edema Skin: Warm and dry.  Intact. Psych: bright affect Musc: No edema in extremities.  No tenderness in extremities. Neuro:  Alert and oriented Motor: 4+/5 throughout. Mild loss of LT on either foot   Assessment/Plan: 1. 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  which require 3+ hours per day of interdisciplinary therapy in a comprehensive inpatient rehab setting.  Physiatrist is providing close team supervision and 24 hour management of active medical problems listed below.  Physiatrist and rehab team continue to assess barriers to  discharge/monitor patient progress toward functional and medical goals  Care Tool:  Bathing    Body parts bathed by patient: Left upper leg, Right lower leg, Left lower leg, Face, Right upper leg, Buttocks, Front perineal area, Abdomen, Chest, Left arm, Right arm         Bathing assist Assist Level: Contact Guard/Touching assist     Upper Body Dressing/Undressing Upper body dressing   What is the patient wearing?: Pull over shirt    Upper body assist Assist Level: Contact Guard/Touching assist    Lower Body Dressing/Undressing Lower body dressing      What is the patient wearing?: Pants     Lower body assist Assist for lower body dressing: Contact Guard/Touching assist     Toileting Toileting Toileting Activity did not occur (Clothing management and hygiene only): N/A (no void or bm)  Toileting assist Assist for toileting: Supervision/Verbal cueing Assistive Device Comment: urinal   Transfers Chair/bed transfer  Transfers assist  Chair/bed transfer activity did not occur: Safety/medical concerns  Chair/bed transfer assist level: Independent with assistive device Chair/bed transfer assistive device: Research officer, political party   Ambulation assist      Assist level: Independent with assistive device Assistive device: Cane-straight Max distance: 150'   Walk 10 feet activity   Assist     Assist level: Independent with assistive device Assistive device: Cane-straight   Walk 50 feet activity   Assist    Assist level: Independent with assistive device Assistive device: Cane-straight    Walk 150 feet activity   Assist  Assist level: Independent with assistive device Assistive device: Cane-straight    Walk 10 feet on uneven surface  activity   Assist     Assist level: Supervision/Verbal cueing Assistive device: Cane-straight   Wheelchair     Assist Will patient use wheelchair at discharge?: No   Wheelchair activity did not  occur: N/A         Wheelchair 50 feet with 2 turns activity    Assist    Wheelchair 50 feet with 2 turns activity did not occur: N/A       Wheelchair 150 feet activity     Assist  Wheelchair 150 feet activity did not occur: N/A       Blood pressure 113/72, pulse 88, temperature 97.9 F (36.6 C), resp. rate 16, weight 74.9 kg, SpO2 100 %.  Medical Problem List and Plan: 1.  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  Oncology.Radiation Oncology services follow-up  Continue CR  On track for dc tomorrow 3/16   Patient to see me or Dr. Ranell Patrick in the office for transitional care encounter in 1-2 weeks.  2. -DVT/anticoagulation. CT 3/2 showed multiple small pulmonary nodules scattered in lungs bilaterally and multiple segmental and subsegmental filling defect in pulmonary arteries compatible with PE.     Xarelto 05/23/2019 after discussion with neurosurgery Dr. Trenton Gammon.               -antiplatelet therapy: N/A 3. Pain Management: Oxycodone as needed as well as Valium as needed for muscle spasms.   Controlled 3/15 4. Mood: Provide emotional support             -antipsychotic agents: N/A 5. Neuropsych: This patient is capable of making decisions on his own behalf. 6. Skin/Wound Care: Routine skin checks 7. Fluids/Electrolytes/Nutrition: Routine in and outs  8.  Acute blood loss anemia.    Hemoglobin up to 9.1 3/15 9.  Constipation.  Laxative assistance. Moving bowels regularly.  10.  Tobacco abuse.  Counseling 11.  Steroid-induced hyperglycemia  Continue to monitor  3/12: Initiated steroid taper yesterday.  3/15- glucose levels better. 12.  Hypoalbuminemia  Supplement initiated on 3/8 13.  Leukocytosis-likely steroid-induced  Afebrile  3/15 Wbc's 18k today but still on decadron taper.   no active signs of infection 14. DC on Tuesday March 16th. Touched base with  Auria yesterday regarding sending medical records for short disability and she said she would follow-up with him regarding this. He only has a few steps to navigate at home.   .    LOS: 9 days A FACE TO FACE EVALUATION WAS PERFORMED  Meredith Staggers 05/26/2019, 2:42 PM

## 2019-05-26 NOTE — Progress Notes (Signed)
Physical Therapy Discharge Summary  Patient Details  Name: Tyrone Washington MRN: [id number] Date of Birth: January 23, 1956  Today's Date: 05/26/2019 PT Individual Time: 2694-8546 PT Individual Time Calculation (min): 39 min   Pt in supine and agreeable to therapy, no c/o pain. Performed bed mobility independently. Donned shoes independently at EOB and ambulated around room, mod/i w/ SPC, performing toilet transfer and self-care tasks in standing at sink. Ambulated to/from therapy gym and around unit, mod/i w/ SPC. Practiced car transfer and negotiated 12 steps w/ unilateral rail, both mod/i. Pt continues to have slow gait pattern, however remains safe w/ SPC and demonstrates safe SPC management. Returned to room and ended session in supine, all needs in reach.   Patient has met 9 of 9 long term goals due to improved activity tolerance, improved balance, improved postural control, increased strength, ability to compensate for deficits and functional use of  right lower extremity and left lower extremity.  Patient to discharge at an ambulatory level Modified Independent.   Patient's care partner is independent to provide the necessary physical assistance at discharge. Pt's sister to check in 1-2x/day to assist w/ IADLs. Pt mod/i for household and community mobility on firm surfaces. Would recommend supervision for ambulation in grass or over uneven terrain, pt w/ good safety awareness and plans to avoid this.   Reasons goals not met: n/a   Recommendation:  Patient will benefit from ongoing skilled PT services in home health setting to continue to advance safe functional mobility, address ongoing impairments in functional balance, BLE proprioception, endurance, and BLE strength, and minimize fall risk.  Equipment: SPC  Reasons for discharge: treatment goals met and discharge from hospital  Patient/family agrees with progress made and goals achieved: Yes  PT  Discharge Precautions/Restrictions Precautions Precautions: Fall Restrictions Weight Bearing Restrictions: No Vital Signs   Vision/Perception  Perception Perception: Within Functional Limits Praxis Praxis: Intact  Cognition Overall Cognitive Status: Within Functional Limits for tasks assessed Arousal/Alertness: Awake/alert Orientation Level: Oriented X4 Memory: Appears intact Awareness: Appears intact Problem Solving: Appears intact Safety/Judgment: Appears intact Sensation Sensation Light Touch: Impaired Detail Light Touch Impaired Details: Impaired RLE;Impaired LLE Proprioception Impaired Details: Impaired RLE;Impaired LLE Coordination Gross Motor Movements are Fluid and Coordinated: No Coordination and Movement Description: Ataxic, functional ambulation also affected by sensation deficits in feet, although improved since eval. Motor  Motor Motor: Ataxia Motor - Discharge Observations: ataxia much improved since evaluation, appears more related to impaired proprioception at this time rather than true cerebellar ataxia  Mobility Bed Mobility Bed Mobility: Supine to Sit;Sit to Supine;Rolling Left;Rolling Right Rolling Right: Independent Rolling Left: Independent Supine to Sit: Independent Sit to Supine: Independent Transfers Transfers: Sit to Stand;Stand to Sit;Stand Pivot Transfers Sit to Stand: Independent with assistive device Stand to Sit: Independent with assistive device Stand Pivot Transfers: Independent with assistive device Transfer (Assistive device): Straight cane Locomotion  Gait Ambulation: Yes Gait Assistance: Independent with assistive device Gait Distance (Feet): 150 Feet Assistive device: Straight cane Gait Gait: Yes Gait Pattern: Impaired Gait Pattern: Left foot flat;Right foot flat;Ataxic(all improved since eval) Gait velocity: decreased Stairs / Additional Locomotion Stairs: Yes Stairs Assistance: Independent with assistive device Stair  Management Technique: One rail Left;With cane Number of Stairs: 12 Height of Stairs: 6 Wheelchair Mobility Wheelchair Mobility: No  Trunk/Postural Assessment  Cervical Assessment Cervical Assessment: Within Functional Limits Thoracic Assessment Thoracic Assessment: Exceptions to WFL(post-op stiffness/guarding) Lumbar Assessment Lumbar Assessment: Within Functional Limits Postural Control Postural Control: Within Functional Limits  Balance Balance Balance  Assessed: Yes Static Sitting Balance Static Sitting - Balance Support: Feet supported;Bilateral upper extremity supported Static Sitting - Level of Assistance: 7: Independent Dynamic Sitting Balance Dynamic Sitting - Level of Assistance: 7: Independent Static Standing Balance Static Standing - Balance Support: During functional activity Static Standing - Level of Assistance: 6: Modified independent (Device/Increase time) Static Standing - Comment/# of Minutes: >30 sec Static Stance: Eyes Closed: >30 sec Static Stance: on Foam: >30 sec Static Stance: on Foam, Eyes Closed: <20 sec before LOB Dynamic Standing Balance Dynamic Standing - Balance Support: During functional activity Dynamic Standing - Level of Assistance: 6: Modified independent (Device/Increase time) Dynamic Standing - Balance Activities: Reaching for objects;Reaching across midline  Berg Balance Scale on 05/23/19: 43/56  Extremity Assessment  RLE Assessment RLE Assessment: Exceptions to [facility] Passive Range of Motion (PROM) Comments: WFLs General Strength Comments: Globally 3 to 4/5 LLE Assessment LLE Assessment: Exceptions to [facility] Passive Range of Motion (PROM) Comments: WFLs General Strength Comments: Globally 3 to 3+/5    Lenzie Montesano K Dionne Rossa 05/26/2019, 9:25 AM

## 2019-05-27 ENCOUNTER — Encounter: Payer: Self-pay | Admitting: Family

## 2019-05-27 NOTE — Plan of Care (Signed)
  Problem: Consults Goal: RH SPINAL CORD INJURY PATIENT EDUCATION Description:  See Patient Education module for education specifics.  Outcome: Completed/Met Goal: Skin Care Protocol Initiated - if Braden Score 18 or less Description: If consults are not indicated, leave blank or document N/A Outcome: Completed/Met Goal: Nutrition Consult-if indicated Outcome: Completed/Met   Problem: SCI BOWEL ELIMINATION Goal: RH STG MANAGE BOWEL WITH ASSISTANCE Description: STG Manage Bowel with Assistance. Outcome: Completed/Met Goal: RH STG SCI MANAGE BOWEL WITH MEDICATION WITH ASSISTANCE Description: STG SCI Manage bowel with medication with assistance. Outcome: Completed/Met   Problem: RH SKIN INTEGRITY Goal: RH STG SKIN FREE OF INFECTION/BREAKDOWN Outcome: Completed/Met Goal: RH STG MAINTAIN SKIN INTEGRITY WITH ASSISTANCE Description: STG Maintain Skin Integrity With Assistance. Outcome: Completed/Met Goal: RH STG ABLE TO PERFORM INCISION/WOUND CARE W/ASSISTANCE Description: STG Able To Perform Incision/Wound Care With Assistance. Outcome: Completed/Met   Problem: RH SAFETY Goal: RH STG ADHERE TO SAFETY PRECAUTIONS W/ASSISTANCE/DEVICE Description: STG Adhere to Safety Precautions With Assistance/Device. Outcome: Completed/Met Goal: RH STG DECREASED RISK OF FALL WITH ASSISTANCE Description: STG Decreased Risk of Fall With Assistance. Outcome: Completed/Met   Problem: RH PAIN MANAGEMENT Goal: RH STG PAIN MANAGED AT OR BELOW PT'S PAIN GOAL Outcome: Completed/Met   Problem: RH KNOWLEDGE DEFICIT SCI Goal: RH STG INCREASE KNOWLEDGE OF SELF CARE AFTER SCI Outcome: Completed/Met

## 2019-05-27 NOTE — Progress Notes (Signed)
[place] PHYSICAL MEDICINE & REHABILITATION PROGRESS NOTE   Subjective/Complaints:  Up in chair. Ready to go home. Feels prepared. Asked about medications he would go home with.   ROS: Patient denies fever, rash, sore throat, blurred vision, nausea, vomiting, diarrhea, cough, shortness of breath or chest pain, joint or back pain, headache, or mood change.    Objective:   No results found. Recent Labs    05/26/19 0532  WBC 18.7*  HGB 9.1*  HCT 28.8*  PLT 272   Recent Labs    05/26/19 0532  NA 135  K 4.9  CL 99  CO2 25  GLUCOSE 103*  BUN 24*  CREATININE 0.98  CALCIUM 8.3*    Intake/Output Summary (Last 24 hours) at 05/27/2019 1002 Last data filed at 05/27/2019 0849 Gross per 24 hour  Intake 660 ml  Output -  Net 660 ml     Physical Exam: Vital Signs Blood pressure 118/87, pulse 82, temperature 97.6 F (36.4 C), temperature source Oral, resp. rate 16, weight 74.9 kg, SpO2 100 %. Constitutional: No distress . Vital signs reviewed. HEENT: EOMI, oral membranes moist Neck: supple Cardiovascular: RRR without murmur. No JVD    Respiratory/Chest: CTA Bilaterally without wheezes or rales. Normal effort    GI/Abdomen: BS +, non-tender, non-distended Ext: no clubbing, cyanosis, or edema Skin: Warm and dry.  Intact. Psych: bright affect Musc: No edema in extremities.  No tenderness in extremities. Neuro:  Alert and oriented Motor: 4+/5 throughout. Mild loss of LT on either foot   Assessment/Plan: 1. 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  which require 3+ hours per day of interdisciplinary therapy in a comprehensive inpatient rehab setting.  Physiatrist is providing close team supervision and 24 hour management of active medical problems listed below.  Physiatrist and rehab team continue to assess barriers to  discharge/monitor patient progress toward functional and medical goals  Care Tool:  Bathing    Body parts bathed by patient: Left upper leg, Right lower leg, Left lower leg, Face, Right upper leg, Buttocks, Front perineal area, Abdomen, Chest, Left arm, Right arm         Bathing assist Assist Level: Independent with assistive device(simulated and per pt report)     Upper Body Dressing/Undressing Upper body dressing   What is the patient wearing?: Pull over shirt    Upper body assist Assist Level: Independent with assistive device    Lower Body Dressing/Undressing Lower body dressing      What is the patient wearing?: Pants     Lower body assist Assist for lower body dressing: Independent with assitive device(simulated)     Toileting Toileting Toileting Activity did not occur (Clothing management and hygiene only): N/A (no void or bm)  Toileting assist Assist for toileting: Independent with assistive device Assistive Device Comment: urinal   Transfers Chair/bed transfer  Transfers assist  Chair/bed transfer activity did not occur: Safety/medical concerns  Chair/bed transfer assist level: Independent with assistive device Chair/bed transfer assistive device: Research officer, political party   Ambulation assist      Assist level: Independent with assistive device Assistive device: Cane-straight Max distance: 150'   Walk 10 feet activity   Assist     Assist level: Independent with assistive device Assistive device: Cane-straight   Walk 50 feet activity   Assist    Assist level: Independent with assistive device Assistive device: Cane-straight  Walk 150 feet activity   Assist    Assist level: Independent with assistive device Assistive device: Cane-straight    Walk 10 feet on uneven surface  activity   Assist     Assist level: Supervision/Verbal cueing Assistive device: Cane-straight   Wheelchair     Assist Will patient use  wheelchair at discharge?: No   Wheelchair activity did not occur: N/A         Wheelchair 50 feet with 2 turns activity    Assist    Wheelchair 50 feet with 2 turns activity did not occur: N/A       Wheelchair 150 feet activity     Assist  Wheelchair 150 feet activity did not occur: N/A       Blood pressure 118/87, pulse 82, temperature 97.6 F (36.4 C), temperature source Oral, resp. rate 16, weight 74.9 kg, SpO2 100 %.  Medical Problem List and Plan: 1.  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  Oncology.Radiation Oncology services follow-up  DC home todaty   Patient to  Dr. Ranell Patrick in the office for transitional care encounter in 1-2 weeks.  2. -DVT/anticoagulation. CT 3/2 showed multiple small pulmonary nodules scattered in lungs bilaterally and multiple segmental and subsegmental filling defect in pulmonary arteries compatible with PE.     -Xarelto initiated 05/23/2019 after discussion with neurosurgery Dr. Trenton Gammon.               -antiplatelet therapy: N/A 3. Pain Management: Oxycodone as needed as well as Valium as needed for muscle spasms.   Controlled 3/16 4. Mood: Provide emotional support             -antipsychotic agents: N/A 5. Neuropsych: This patient is capable of making decisions on his own behalf. 6. Skin/Wound Care: Routine skin checks 7. Fluids/Electrolytes/Nutrition: Routine in and outs  8.  Acute blood loss anemia.    Hemoglobin up to 9.1 3/15 9.  Constipation.  Laxative assistance. Moving bowels regularly.  10.  Tobacco abuse.  Counseling 11.  Steroid-induced hyperglycemia  Continue to monitor  3/12: Initiated steroid taper yesterday.  3/15- glucose levels better.  12.  Hypoalbuminemia  Supplement initiated on 3/8 13.  Leukocytosis-likely steroid-induced  Afebrile  3/15 Wbc's 18k today but still on decadron taper.   3/16 still no  active signs of infection     .    LOS: 10 days A FACE TO FACE EVALUATION WAS PERFORMED  Meredith Staggers 05/27/2019, 10:02 AM

## 2019-05-27 NOTE — Progress Notes (Signed)
Social Work Patient ID: Tyrone Washington, male   DOB: 30-Jul-1955, 64 y.o.   MRN: [id number]   SW met with pt in room to inform on waiting on updates from [facility], and if unable to secure prior to discharge, will follow-up. SW confirms that DME: single point cane in pt room.  SW spoke with Karsten Fells Brunette/[facility] [phone]) who accepted referral for HHPT/OT. Reports will follow-up with pt. SW met with pt in room prior to d/c to inform on HHA and provide contact information. No further questions concerns.   Loralee Pacas, MSW, [place] Office: [phone] Cell: [phone] Fax: [phone]

## 2019-05-27 NOTE — Progress Notes (Signed)
Patient discharging home. Patient leaving by wheelchair to personal vehicle for ride home. Patient alert and able to make needs known. Education provided by Linna Hoff, PA and medications brought up by pharmacy. Skin intact. Patient excited to go home! Patient said to thank everyone and that he will miss [place] all.

## 2019-05-28 ENCOUNTER — Ambulatory Visit
Admission: RE | Admit: 2019-05-28 | Discharge: 2019-05-28 | Disposition: A | Discharge status: Home / Self Care | Payer: BC Managed Care – PPO | Source: Ambulatory Visit | Attending: Radiation Oncology | Admitting: Radiation Oncology

## 2019-05-28 ENCOUNTER — Other Ambulatory Visit: Payer: Self-pay

## 2019-05-28 ENCOUNTER — Telehealth: Payer: Self-pay | Admitting: Registered Nurse

## 2019-05-28 DIAGNOSIS — C801 Malignant (primary) neoplasm, unspecified: Secondary | ICD-10-CM | POA: Insufficient documentation

## 2019-05-28 DIAGNOSIS — Z510 Encounter for antineoplastic radiation therapy: Secondary | ICD-10-CM | POA: Insufficient documentation

## 2019-05-28 DIAGNOSIS — C7951 Secondary malignant neoplasm of bone: Secondary | ICD-10-CM | POA: Diagnosis not present

## 2019-05-28 NOTE — Telephone Encounter (Signed)
Transitional Care call  Patient name: Tyrone Washington. Tyrone Washington  DOB: Jun 09, 1955 1. Are you/is patient experiencing any problems since coming home? No a. Are there any questions regarding any aspect of care? No 2. Are there any questions regarding medications administration/dosing? No a. Are meds being taken as prescribed? Yes b. "Patient should review meds with caller to confirm" Medication List Reviewed. 3. Have there been any falls? No 4. Has Home Health been to the house and/or have they contacted you? No: [facility], he was instructed to call office if he doesn't hear from [facility] by 05/29/2019, he verbalizes understanding.  a. If not, have you tried to contact them? No, see the above  b. Can we help you contact them? See the above note 5. Are bowels and bladder emptying properly? Yes a. Are there any unexpected incontinence issues? No b. If applicable, is patient following bowel/bladder programs? NA 6. Any fevers, problems with breathing, unexpected pain? No 7. Are there any skin problems or new areas of breakdown? No 8. Has the patient/family member arranged specialty MD follow up (ie cardiology/neurology/renal/surgical/etc.)?  He was instructed to call Dr. Alen Blew to schedule HFU appointment.  a. Can we help arrange? No 9. Does the patient need any other services or support that we can help arrange? No 10. Are caregivers following through as expected in assisting the patient? (                             ) 11. Has the patient quit smoking, drinking alcohol, or using drugs as recommended? (                        )  Appointment date/time 06/05/2019  arrival time 12:20 for 12:40 appointment with Dr. Ranell Patrick. At  [place]

## 2019-05-28 NOTE — Progress Notes (Signed)
Social Work Discharge Note   The overall goal for the admission was met for:   Discharge location: Yes. Discharge to home.   Length of Stay: Yes. 10 days  Discharge activity level: Yes. Mod I.   Home/community participation: Yes. Limited  Services provided included: MD, RD, PT, OT, SLP, RN, CM, TR, Pharmacy, Neuropsych and SW  Financial Services: Medicare and Private Insurance: [place] (primary)  Follow-up services arranged: Home Health: [facility] for PT/OT , DME: [place] for single point cane [phone] and Patient/Family request agency HH: [facility], DME: N/A  Comments (or additional information): contact pt [phone]  Patient/Family verbalized understanding of follow-up arrangements: Yes  Individual responsible for coordination of the follow-up plan: Pt will coordinate care needs.   Confirmed correct DME delivered: Rana Snare 05/28/2019    Loralee Pacas, MSW, [place] Office: [phone] Cell: [phone] Fax: [phone]

## 2019-05-29 ENCOUNTER — Telehealth: Payer: Self-pay | Admitting: *Deleted

## 2019-05-29 NOTE — Telephone Encounter (Signed)
LVM for pt to call office to schedule hospital follow up

## 2019-05-30 ENCOUNTER — Telehealth: Payer: Self-pay | Admitting: *Deleted

## 2019-05-30 NOTE — Telephone Encounter (Signed)
I have made two attempts and have been unable to reach patient. I left another voice mail and  I have mailed the patient a letter requesting they call the office to schedule a hospital follow up appointment.

## 2019-06-02 DIAGNOSIS — I2699 Other pulmonary embolism without acute cor pulmonale: Secondary | ICD-10-CM | POA: Diagnosis not present

## 2019-06-02 DIAGNOSIS — G959 Disease of spinal cord, unspecified: Secondary | ICD-10-CM | POA: Diagnosis not present

## 2019-06-02 DIAGNOSIS — Z483 Aftercare following surgery for neoplasm: Secondary | ICD-10-CM | POA: Diagnosis not present

## 2019-06-02 DIAGNOSIS — E785 Hyperlipidemia, unspecified: Secondary | ICD-10-CM | POA: Diagnosis not present

## 2019-06-02 DIAGNOSIS — Z7901 Long term (current) use of anticoagulants: Secondary | ICD-10-CM | POA: Diagnosis not present

## 2019-06-02 DIAGNOSIS — F17210 Nicotine dependence, cigarettes, uncomplicated: Secondary | ICD-10-CM | POA: Diagnosis not present

## 2019-06-02 DIAGNOSIS — E46 Unspecified protein-calorie malnutrition: Secondary | ICD-10-CM | POA: Diagnosis not present

## 2019-06-02 DIAGNOSIS — M5416 Radiculopathy, lumbar region: Secondary | ICD-10-CM | POA: Diagnosis not present

## 2019-06-02 DIAGNOSIS — D649 Anemia, unspecified: Secondary | ICD-10-CM | POA: Diagnosis not present

## 2019-06-03 ENCOUNTER — Ambulatory Visit
Admission: RE | Admit: 2019-06-03 | Discharge: 2019-06-03 | Disposition: A | Discharge status: Home / Self Care | Payer: BC Managed Care – PPO | Source: Ambulatory Visit | Attending: Radiation Oncology | Admitting: Radiation Oncology

## 2019-06-03 ENCOUNTER — Other Ambulatory Visit: Payer: Self-pay

## 2019-06-03 DIAGNOSIS — C801 Malignant (primary) neoplasm, unspecified: Secondary | ICD-10-CM | POA: Diagnosis not present

## 2019-06-03 DIAGNOSIS — Z510 Encounter for antineoplastic radiation therapy: Secondary | ICD-10-CM | POA: Diagnosis not present

## 2019-06-03 DIAGNOSIS — C7951 Secondary malignant neoplasm of bone: Secondary | ICD-10-CM | POA: Diagnosis not present

## 2019-06-04 ENCOUNTER — Other Ambulatory Visit: Payer: Self-pay

## 2019-06-04 ENCOUNTER — Ambulatory Visit
Admission: RE | Admit: 2019-06-04 | Discharge: 2019-06-04 | Disposition: A | Discharge status: Home / Self Care | Payer: BC Managed Care – PPO | Source: Ambulatory Visit | Attending: Radiation Oncology | Admitting: Radiation Oncology

## 2019-06-04 DIAGNOSIS — G959 Disease of spinal cord, unspecified: Secondary | ICD-10-CM | POA: Diagnosis not present

## 2019-06-04 DIAGNOSIS — F17210 Nicotine dependence, cigarettes, uncomplicated: Secondary | ICD-10-CM | POA: Diagnosis not present

## 2019-06-04 DIAGNOSIS — M5416 Radiculopathy, lumbar region: Secondary | ICD-10-CM | POA: Diagnosis not present

## 2019-06-04 DIAGNOSIS — Z510 Encounter for antineoplastic radiation therapy: Secondary | ICD-10-CM | POA: Diagnosis not present

## 2019-06-04 DIAGNOSIS — Z7901 Long term (current) use of anticoagulants: Secondary | ICD-10-CM | POA: Diagnosis not present

## 2019-06-04 DIAGNOSIS — Z483 Aftercare following surgery for neoplasm: Secondary | ICD-10-CM | POA: Diagnosis not present

## 2019-06-04 DIAGNOSIS — E785 Hyperlipidemia, unspecified: Secondary | ICD-10-CM | POA: Diagnosis not present

## 2019-06-04 DIAGNOSIS — D649 Anemia, unspecified: Secondary | ICD-10-CM | POA: Diagnosis not present

## 2019-06-04 DIAGNOSIS — C801 Malignant (primary) neoplasm, unspecified: Secondary | ICD-10-CM | POA: Diagnosis not present

## 2019-06-04 DIAGNOSIS — I2699 Other pulmonary embolism without acute cor pulmonale: Secondary | ICD-10-CM | POA: Diagnosis not present

## 2019-06-04 DIAGNOSIS — C7951 Secondary malignant neoplasm of bone: Secondary | ICD-10-CM | POA: Diagnosis not present

## 2019-06-04 DIAGNOSIS — E46 Unspecified protein-calorie malnutrition: Secondary | ICD-10-CM | POA: Diagnosis not present

## 2019-06-05 ENCOUNTER — Encounter: Payer: Self-pay | Admitting: Physical Medicine and Rehabilitation

## 2019-06-05 ENCOUNTER — Ambulatory Visit
Admission: RE | Admit: 2019-06-05 | Discharge: 2019-06-05 | Disposition: A | Discharge status: Home / Self Care | Payer: BC Managed Care – PPO | Source: Ambulatory Visit | Attending: Radiation Oncology | Admitting: Radiation Oncology

## 2019-06-05 ENCOUNTER — Encounter
Payer: BC Managed Care – PPO | Attending: Physical Medicine and Rehabilitation | Admitting: Physical Medicine and Rehabilitation

## 2019-06-05 ENCOUNTER — Other Ambulatory Visit: Payer: Self-pay

## 2019-06-05 VITALS — BP 119/80 | HR 100 | Temp 97.7°F | Ht 73.0 in | Wt 164.0 lb

## 2019-06-05 DIAGNOSIS — C7949 Secondary malignant neoplasm of other parts of nervous system: Secondary | ICD-10-CM

## 2019-06-05 DIAGNOSIS — Z510 Encounter for antineoplastic radiation therapy: Secondary | ICD-10-CM | POA: Diagnosis not present

## 2019-06-05 DIAGNOSIS — G959 Disease of spinal cord, unspecified: Secondary | ICD-10-CM

## 2019-06-05 DIAGNOSIS — C801 Malignant (primary) neoplasm, unspecified: Secondary | ICD-10-CM | POA: Diagnosis not present

## 2019-06-05 DIAGNOSIS — C7951 Secondary malignant neoplasm of bone: Secondary | ICD-10-CM | POA: Diagnosis not present

## 2019-06-05 NOTE — Progress Notes (Addendum)
Subjective:    Patient ID: Tyrone Washington, male    DOB: February 07, 1956, 64 y.o.   MRN: [id number]  HPI  Tyrone Washington is a 64 year old man who presents for transitional care appointment after CIR admission for myelopathy.  He has been doing very well at home, receiving home therapies, and ambulating well within his home without any falls. He has been using a cane but at times at home he forgets to use it and has still managed well. He lives alone but has social support among his friends.   He has been receiving daily radiation treatments and denies any symptoms of fatigue or nausea.   All medications reviewed and he is taking most of them as prescribed. He has not been taking the multivitamin. He does not require any refills. He has primary care follow-up on Friday.  He has had no pain but has still been taking the Oxycodone once in a while since it was prescribed to him.  He has had no anxiety and believes he has not been taking the Valium.  He has no constipation and has stopped taking the Miralax.    He is eating a nutritious diet rich in fruits and vegetables, baked fish and chicken sparingly.   Pain Inventory Average Pain 0 Pain Right Now 0 My pain is na  In the last 24 hours, has pain interfered with the following? General activity 0 Relation with others 0 Enjoyment of life 0 What TIME of day is your pain at its worst? na Sleep (in general) Good  Pain is worse with: na Pain improves with: na Relief from Meds: na  Mobility walk with assistance use a cane ability to climb steps?  yes do you drive?  yes  Function employed # of hrs/week on disabilty what is your job? on disability  Neuro/Psych tingling trouble walking  Prior Studies Any changes since last visit?  no  Physicians involved in your care [facility]   Family History  Problem Relation Age of Onset  . Heart disease Father        patient is unsure of history  . CVA Father   .  Heart attack Neg Hx   . Colon cancer Neg Hx   . Esophageal cancer Neg Hx   . Rectal cancer Neg Hx   . Stomach cancer Neg Hx    Social History   Socioeconomic History  . Marital status: Single    Spouse name: Not on file  . Number of children: 2  . Years of education: Not on file  . Highest education level: Not on file  Occupational History    Employer: [employer]  Tobacco Use  . Smoking status: Current Every Day Smoker  . Smokeless tobacco: Never Used  . Tobacco comment: Smokes 1 - 2 cigars daily  Substance and Sexual Activity  . Alcohol use: Yes    Alcohol/week: 7.0 standard drinks    Types: 7 Cans of beer per week  . Drug use: Never  . Sexual activity: Not on file  Other Topics Concern  . 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     Denies hobbies   No pets.  Social Determinants of Health   Financial Resource Strain:   . Difficulty of Paying Living Expenses:   Food Insecurity:   . Worried About Charity fundraiser in the Last Year:   . Arboriculturist in the Last Year:   Transportation Needs:   . Film/video editor (Medical):   Marland Kitchen Lack of Transportation (Non-Medical):   Physical Activity:   . Days of Exercise per Week:   . Minutes of Exercise per Session:   Stress:   . Feeling of Stress :   Social Connections:   . Frequency of Communication with Friends and Family:   . Frequency of Social Gatherings with Friends and Family:   . Attends Religious Services:   . Active Member of Clubs or Organizations:   . Attends Archivist Meetings:   Marland Kitchen Marital Status:    Past Surgical History:  Procedure Laterality Date  . COLONOSCOPY    . LAMINECTOMY N/A 05/13/2019   Procedure: THORACIC TEN LAMINECTOMY FOR TUMOR;   Surgeon: Earnie Larsson, MD;  Location: [facility];  Service: Neurosurgery;  Laterality: N/A;  . THYROID SURGERY     "Years ago"   Past Medical History:  Diagnosis Date  . Allergy    allergic rhinitis  . Arthritis   . B12 deficiency 04/10/2019  . History of chicken pox   . Hyperlipidemia    no meds now   BP 119/80   Pulse 100   Temp 97.7 F (36.5 C)   Ht 6\' 1"  (1.854 m)   Wt 164 lb (74.4 kg)   SpO2 97%   BMI 21.64 kg/m   Opioid Risk Score:   Fall Risk Score:  `1  Depression screen PHQ 2/9  Depression screen PHQ 2/9 06/05/2019  Decreased Interest 0  Down, Depressed, Hopeless 0  PHQ - 2 Score 0  Altered sleeping 2  Tired, decreased energy 0  Change in appetite 2  Feeling bad or failure about yourself  0  Trouble concentrating 0  Moving slowly or fidgety/restless 0  Suicidal thoughts 0  PHQ-9 Score 4  Difficult doing work/chores Not difficult at all     Review of Systems  Musculoskeletal: Positive for gait problem.  Neurological:       Tingling  All other systems reviewed and are negative.      Objective:   Physical Exam HEENT: EOMI, oral membranes moist Neck: Large mass stable in size. No erythema or purulence.  Cardiovascular: RRR without murmur. No JVD    Respiratory/Chest: CTA Bilaterally without wheezes or rales. Normal effort    GI/Abdomen: BS +, non-tender, non-distended Ext: no clubbing, cyanosis, or edema Skin: Warm and dry. Spinal incision well healed.  Psych: bright affect Musc: No edema in extremities.  No tenderness in extremities. Neuro: Alert and oriented Motor: 4+/5 throughout. Mild loss of LT on either foot Ambulates modI with cane up and down hallway- improved balance and speed.     Assessment & Plan:  1. 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 Oncology.Radiation Oncology services follow-up            Tyrone Washington has been  progressing very well. He has been receiving home therapies and ambulating modI with cane. He has had no falls and is able to ambulate without cane sometimes at home. Continue home therapies with focus on ambulation and strengthening.   Receiving daily radiation sessions and tolerating well.   He  lives alone and has some difficulties in remembering which appointments he needs. Discussed the importance of follow-up with neurosurgery and oncology and provided phone number for these appointments.   2. -DVT/anticoagulation. CT 3/2 showed multiple small pulmonary nodules scattered in lungs bilaterally and multiple segmental and subsegmental filling defect in pulmonary arteries compatible with PE.   Xarelto initiated 05/23/2019 after discussion with neurosurgery Dr. Trenton Gammon.  He is taking as prescribed and does not require refills.  3. Pain Management: Denies pain. Is not sure whether he is taking Valium and has been taking 1 oxycodone occasionally just because it was it was prescribed to him. Advised that since he is having no pain or anxiety he should stop taking both of these medications.   4. Skin/Wound Care: Thyroid mass assessed and stable. Spinal incision assessed and well healed.   5. Wound healing/B12 deficiency/acute blood loss anemia: Has been eating a healthy diet. Encouraged high protein given hypoalbuminemia during hospitalization. Advised restarting multivitamin and following up with PCP regarding B12 deficiency on his Friday appointment.   All questions answered. RTC as needed.

## 2019-06-05 NOTE — Addendum Note (Signed)
Addended by: Izora Ribas on: 06/05/2019 04:31 PM   Modules accepted: Level of Service

## 2019-06-06 ENCOUNTER — Ambulatory Visit
Admission: RE | Admit: 2019-06-06 | Discharge: 2019-06-06 | Disposition: A | Discharge status: Home / Self Care | Payer: BC Managed Care – PPO | Source: Ambulatory Visit | Attending: Radiation Oncology | Admitting: Radiation Oncology

## 2019-06-06 ENCOUNTER — Other Ambulatory Visit: Payer: Self-pay

## 2019-06-06 ENCOUNTER — Ambulatory Visit: Payer: BC Managed Care – PPO | Admitting: Family

## 2019-06-06 DIAGNOSIS — D649 Anemia, unspecified: Secondary | ICD-10-CM | POA: Diagnosis not present

## 2019-06-06 DIAGNOSIS — M5416 Radiculopathy, lumbar region: Secondary | ICD-10-CM | POA: Diagnosis not present

## 2019-06-06 DIAGNOSIS — E785 Hyperlipidemia, unspecified: Secondary | ICD-10-CM | POA: Diagnosis not present

## 2019-06-06 DIAGNOSIS — Z7901 Long term (current) use of anticoagulants: Secondary | ICD-10-CM | POA: Diagnosis not present

## 2019-06-06 DIAGNOSIS — Z0289 Encounter for other administrative examinations: Secondary | ICD-10-CM

## 2019-06-06 DIAGNOSIS — Z483 Aftercare following surgery for neoplasm: Secondary | ICD-10-CM | POA: Diagnosis not present

## 2019-06-06 DIAGNOSIS — C801 Malignant (primary) neoplasm, unspecified: Secondary | ICD-10-CM | POA: Diagnosis not present

## 2019-06-06 DIAGNOSIS — I2699 Other pulmonary embolism without acute cor pulmonale: Secondary | ICD-10-CM | POA: Diagnosis not present

## 2019-06-06 DIAGNOSIS — F17210 Nicotine dependence, cigarettes, uncomplicated: Secondary | ICD-10-CM | POA: Diagnosis not present

## 2019-06-06 DIAGNOSIS — E46 Unspecified protein-calorie malnutrition: Secondary | ICD-10-CM | POA: Diagnosis not present

## 2019-06-06 DIAGNOSIS — G959 Disease of spinal cord, unspecified: Secondary | ICD-10-CM | POA: Diagnosis not present

## 2019-06-06 DIAGNOSIS — Z510 Encounter for antineoplastic radiation therapy: Secondary | ICD-10-CM | POA: Diagnosis not present

## 2019-06-06 DIAGNOSIS — C7951 Secondary malignant neoplasm of bone: Secondary | ICD-10-CM | POA: Diagnosis not present

## 2019-06-09 ENCOUNTER — Ambulatory Visit
Admission: RE | Admit: 2019-06-09 | Discharge: 2019-06-09 | Disposition: A | Discharge status: Home / Self Care | Payer: BC Managed Care – PPO | Source: Ambulatory Visit | Attending: Radiation Oncology | Admitting: Radiation Oncology

## 2019-06-09 ENCOUNTER — Other Ambulatory Visit: Payer: Self-pay

## 2019-06-09 DIAGNOSIS — Z510 Encounter for antineoplastic radiation therapy: Secondary | ICD-10-CM | POA: Diagnosis not present

## 2019-06-09 DIAGNOSIS — C801 Malignant (primary) neoplasm, unspecified: Secondary | ICD-10-CM | POA: Diagnosis not present

## 2019-06-09 DIAGNOSIS — C7951 Secondary malignant neoplasm of bone: Secondary | ICD-10-CM | POA: Diagnosis not present

## 2019-06-10 ENCOUNTER — Ambulatory Visit
Admission: RE | Admit: 2019-06-10 | Discharge: 2019-06-10 | Disposition: A | Discharge status: Home / Self Care | Payer: BC Managed Care – PPO | Source: Ambulatory Visit | Attending: Radiation Oncology | Admitting: Radiation Oncology

## 2019-06-10 ENCOUNTER — Other Ambulatory Visit: Payer: Self-pay

## 2019-06-10 ENCOUNTER — Ambulatory Visit (INDEPENDENT_AMBULATORY_CARE_PROVIDER_SITE_OTHER): Payer: BC Managed Care – PPO

## 2019-06-10 DIAGNOSIS — Z510 Encounter for antineoplastic radiation therapy: Secondary | ICD-10-CM | POA: Diagnosis not present

## 2019-06-10 DIAGNOSIS — C801 Malignant (primary) neoplasm, unspecified: Secondary | ICD-10-CM | POA: Diagnosis not present

## 2019-06-10 DIAGNOSIS — C7951 Secondary malignant neoplasm of bone: Secondary | ICD-10-CM | POA: Diagnosis not present

## 2019-06-10 DIAGNOSIS — E538 Deficiency of other specified B group vitamins: Secondary | ICD-10-CM | POA: Diagnosis not present

## 2019-06-10 MED ORDER — CYANOCOBALAMIN 1000 MCG/ML IJ SOLN
1000.0000 ug | Freq: Once | INTRAMUSCULAR | Status: AC
  Administered 2019-06-10: 1000 ug via INTRAMUSCULAR

## 2019-06-10 NOTE — Progress Notes (Addendum)
Pt here for monthly B12 injection per Melissa O'Sullivan,NP  B12 1035mcg given IM, left deltoid and pt tolerated injection well.  Next B12 injection scheduled for April 30,2021  Reviewed and agree.  Nance Pear NP

## 2019-06-11 ENCOUNTER — Ambulatory Visit
Admission: RE | Admit: 2019-06-11 | Discharge: 2019-06-11 | Disposition: A | Discharge status: Home / Self Care | Payer: BC Managed Care – PPO | Source: Ambulatory Visit | Attending: Radiation Oncology | Admitting: Radiation Oncology

## 2019-06-11 ENCOUNTER — Other Ambulatory Visit: Payer: Self-pay

## 2019-06-11 DIAGNOSIS — Z510 Encounter for antineoplastic radiation therapy: Secondary | ICD-10-CM | POA: Diagnosis not present

## 2019-06-11 DIAGNOSIS — C801 Malignant (primary) neoplasm, unspecified: Secondary | ICD-10-CM | POA: Diagnosis not present

## 2019-06-11 DIAGNOSIS — G959 Disease of spinal cord, unspecified: Secondary | ICD-10-CM | POA: Diagnosis not present

## 2019-06-11 DIAGNOSIS — C7951 Secondary malignant neoplasm of bone: Secondary | ICD-10-CM | POA: Diagnosis not present

## 2019-06-11 DIAGNOSIS — D649 Anemia, unspecified: Secondary | ICD-10-CM | POA: Diagnosis not present

## 2019-06-11 DIAGNOSIS — I2699 Other pulmonary embolism without acute cor pulmonale: Secondary | ICD-10-CM | POA: Diagnosis not present

## 2019-06-11 DIAGNOSIS — E785 Hyperlipidemia, unspecified: Secondary | ICD-10-CM | POA: Diagnosis not present

## 2019-06-11 DIAGNOSIS — Z483 Aftercare following surgery for neoplasm: Secondary | ICD-10-CM | POA: Diagnosis not present

## 2019-06-11 DIAGNOSIS — F17210 Nicotine dependence, cigarettes, uncomplicated: Secondary | ICD-10-CM | POA: Diagnosis not present

## 2019-06-11 DIAGNOSIS — Z7901 Long term (current) use of anticoagulants: Secondary | ICD-10-CM | POA: Diagnosis not present

## 2019-06-11 DIAGNOSIS — M5416 Radiculopathy, lumbar region: Secondary | ICD-10-CM | POA: Diagnosis not present

## 2019-06-11 DIAGNOSIS — E46 Unspecified protein-calorie malnutrition: Secondary | ICD-10-CM | POA: Diagnosis not present

## 2019-06-12 ENCOUNTER — Ambulatory Visit
Admission: RE | Admit: 2019-06-12 | Discharge: 2019-06-12 | Disposition: A | Discharge status: Home / Self Care | Payer: BC Managed Care – PPO | Source: Ambulatory Visit | Attending: Radiation Oncology | Admitting: Radiation Oncology

## 2019-06-12 ENCOUNTER — Other Ambulatory Visit: Payer: Self-pay

## 2019-06-12 DIAGNOSIS — C7951 Secondary malignant neoplasm of bone: Secondary | ICD-10-CM | POA: Insufficient documentation

## 2019-06-12 DIAGNOSIS — C801 Malignant (primary) neoplasm, unspecified: Secondary | ICD-10-CM

## 2019-06-12 DIAGNOSIS — Z510 Encounter for antineoplastic radiation therapy: Secondary | ICD-10-CM | POA: Insufficient documentation

## 2019-06-12 HISTORY — DX: Malignant (primary) neoplasm, unspecified: C80.1

## 2019-06-13 ENCOUNTER — Ambulatory Visit
Admission: RE | Admit: 2019-06-13 | Discharge: 2019-06-13 | Disposition: A | Discharge status: Home / Self Care | Payer: BC Managed Care – PPO | Source: Ambulatory Visit | Attending: Radiation Oncology | Admitting: Radiation Oncology

## 2019-06-13 ENCOUNTER — Other Ambulatory Visit: Payer: Self-pay

## 2019-06-13 DIAGNOSIS — C801 Malignant (primary) neoplasm, unspecified: Secondary | ICD-10-CM | POA: Diagnosis not present

## 2019-06-13 DIAGNOSIS — Z510 Encounter for antineoplastic radiation therapy: Secondary | ICD-10-CM | POA: Diagnosis not present

## 2019-06-13 DIAGNOSIS — C7951 Secondary malignant neoplasm of bone: Secondary | ICD-10-CM | POA: Diagnosis not present

## 2019-06-16 ENCOUNTER — Other Ambulatory Visit: Payer: Self-pay

## 2019-06-16 ENCOUNTER — Ambulatory Visit
Admission: RE | Admit: 2019-06-16 | Discharge: 2019-06-16 | Disposition: A | Discharge status: Home / Self Care | Payer: BC Managed Care – PPO | Source: Ambulatory Visit | Attending: Radiation Oncology | Admitting: Radiation Oncology

## 2019-06-16 DIAGNOSIS — I2699 Other pulmonary embolism without acute cor pulmonale: Secondary | ICD-10-CM | POA: Diagnosis not present

## 2019-06-16 DIAGNOSIS — Z483 Aftercare following surgery for neoplasm: Secondary | ICD-10-CM | POA: Diagnosis not present

## 2019-06-16 DIAGNOSIS — C7951 Secondary malignant neoplasm of bone: Secondary | ICD-10-CM | POA: Diagnosis not present

## 2019-06-16 DIAGNOSIS — E785 Hyperlipidemia, unspecified: Secondary | ICD-10-CM | POA: Diagnosis not present

## 2019-06-16 DIAGNOSIS — C801 Malignant (primary) neoplasm, unspecified: Secondary | ICD-10-CM | POA: Diagnosis not present

## 2019-06-16 DIAGNOSIS — Z510 Encounter for antineoplastic radiation therapy: Secondary | ICD-10-CM | POA: Diagnosis not present

## 2019-06-16 DIAGNOSIS — Z7901 Long term (current) use of anticoagulants: Secondary | ICD-10-CM | POA: Diagnosis not present

## 2019-06-16 DIAGNOSIS — E46 Unspecified protein-calorie malnutrition: Secondary | ICD-10-CM | POA: Diagnosis not present

## 2019-06-16 DIAGNOSIS — G959 Disease of spinal cord, unspecified: Secondary | ICD-10-CM | POA: Diagnosis not present

## 2019-06-16 DIAGNOSIS — D649 Anemia, unspecified: Secondary | ICD-10-CM | POA: Diagnosis not present

## 2019-06-16 DIAGNOSIS — F17210 Nicotine dependence, cigarettes, uncomplicated: Secondary | ICD-10-CM | POA: Diagnosis not present

## 2019-06-16 DIAGNOSIS — M5416 Radiculopathy, lumbar region: Secondary | ICD-10-CM | POA: Diagnosis not present

## 2019-06-17 ENCOUNTER — Ambulatory Visit
Admission: RE | Admit: 2019-06-17 | Discharge: 2019-06-17 | Disposition: A | Discharge status: Home / Self Care | Payer: BC Managed Care – PPO | Source: Ambulatory Visit | Attending: Radiation Oncology | Admitting: Radiation Oncology

## 2019-06-17 ENCOUNTER — Other Ambulatory Visit: Payer: Self-pay

## 2019-06-17 DIAGNOSIS — Z510 Encounter for antineoplastic radiation therapy: Secondary | ICD-10-CM | POA: Diagnosis not present

## 2019-06-17 DIAGNOSIS — C7951 Secondary malignant neoplasm of bone: Secondary | ICD-10-CM | POA: Diagnosis not present

## 2019-06-17 DIAGNOSIS — C801 Malignant (primary) neoplasm, unspecified: Secondary | ICD-10-CM | POA: Diagnosis not present

## 2019-06-18 ENCOUNTER — Ambulatory Visit
Admission: RE | Admit: 2019-06-18 | Discharge: 2019-06-18 | Disposition: A | Discharge status: Home / Self Care | Payer: BC Managed Care – PPO | Source: Ambulatory Visit | Attending: Radiation Oncology | Admitting: Radiation Oncology

## 2019-06-18 ENCOUNTER — Other Ambulatory Visit: Payer: Self-pay

## 2019-06-18 DIAGNOSIS — C7951 Secondary malignant neoplasm of bone: Secondary | ICD-10-CM | POA: Diagnosis not present

## 2019-06-18 DIAGNOSIS — Z510 Encounter for antineoplastic radiation therapy: Secondary | ICD-10-CM | POA: Diagnosis not present

## 2019-06-18 DIAGNOSIS — C801 Malignant (primary) neoplasm, unspecified: Secondary | ICD-10-CM | POA: Diagnosis not present

## 2019-06-19 ENCOUNTER — Other Ambulatory Visit: Payer: Self-pay

## 2019-06-19 ENCOUNTER — Ambulatory Visit
Admission: RE | Admit: 2019-06-19 | Discharge: 2019-06-19 | Disposition: A | Discharge status: Home / Self Care | Payer: BC Managed Care – PPO | Source: Ambulatory Visit | Attending: Radiation Oncology | Admitting: Radiation Oncology

## 2019-06-19 DIAGNOSIS — Z510 Encounter for antineoplastic radiation therapy: Secondary | ICD-10-CM | POA: Diagnosis not present

## 2019-06-19 DIAGNOSIS — D649 Anemia, unspecified: Secondary | ICD-10-CM | POA: Diagnosis not present

## 2019-06-19 DIAGNOSIS — C7951 Secondary malignant neoplasm of bone: Secondary | ICD-10-CM | POA: Diagnosis not present

## 2019-06-19 DIAGNOSIS — F17210 Nicotine dependence, cigarettes, uncomplicated: Secondary | ICD-10-CM | POA: Diagnosis not present

## 2019-06-19 DIAGNOSIS — E785 Hyperlipidemia, unspecified: Secondary | ICD-10-CM | POA: Diagnosis not present

## 2019-06-19 DIAGNOSIS — E46 Unspecified protein-calorie malnutrition: Secondary | ICD-10-CM | POA: Diagnosis not present

## 2019-06-19 DIAGNOSIS — M5416 Radiculopathy, lumbar region: Secondary | ICD-10-CM | POA: Diagnosis not present

## 2019-06-19 DIAGNOSIS — Z7901 Long term (current) use of anticoagulants: Secondary | ICD-10-CM | POA: Diagnosis not present

## 2019-06-19 DIAGNOSIS — C801 Malignant (primary) neoplasm, unspecified: Secondary | ICD-10-CM | POA: Diagnosis not present

## 2019-06-19 DIAGNOSIS — Z483 Aftercare following surgery for neoplasm: Secondary | ICD-10-CM | POA: Diagnosis not present

## 2019-06-19 DIAGNOSIS — I2699 Other pulmonary embolism without acute cor pulmonale: Secondary | ICD-10-CM | POA: Diagnosis not present

## 2019-06-19 DIAGNOSIS — G959 Disease of spinal cord, unspecified: Secondary | ICD-10-CM | POA: Diagnosis not present

## 2019-06-20 ENCOUNTER — Ambulatory Visit
Admission: RE | Admit: 2019-06-20 | Discharge: 2019-06-20 | Disposition: A | Discharge status: Home / Self Care | Payer: BC Managed Care – PPO | Source: Ambulatory Visit | Attending: Radiation Oncology | Admitting: Radiation Oncology

## 2019-06-20 ENCOUNTER — Other Ambulatory Visit: Payer: Self-pay

## 2019-06-20 DIAGNOSIS — Z510 Encounter for antineoplastic radiation therapy: Secondary | ICD-10-CM | POA: Diagnosis not present

## 2019-06-20 DIAGNOSIS — C7951 Secondary malignant neoplasm of bone: Secondary | ICD-10-CM | POA: Diagnosis not present

## 2019-06-20 DIAGNOSIS — C801 Malignant (primary) neoplasm, unspecified: Secondary | ICD-10-CM | POA: Diagnosis not present

## 2019-06-23 ENCOUNTER — Other Ambulatory Visit: Payer: Self-pay

## 2019-06-23 ENCOUNTER — Ambulatory Visit
Admission: RE | Admit: 2019-06-23 | Discharge: 2019-06-23 | Disposition: A | Discharge status: Home / Self Care | Payer: BC Managed Care – PPO | Source: Ambulatory Visit | Attending: Radiation Oncology | Admitting: Radiation Oncology

## 2019-06-23 ENCOUNTER — Encounter: Payer: Self-pay | Admitting: Radiation Oncology

## 2019-06-23 DIAGNOSIS — C801 Malignant (primary) neoplasm, unspecified: Secondary | ICD-10-CM | POA: Diagnosis not present

## 2019-06-23 DIAGNOSIS — C7951 Secondary malignant neoplasm of bone: Secondary | ICD-10-CM | POA: Diagnosis not present

## 2019-06-23 DIAGNOSIS — Z510 Encounter for antineoplastic radiation therapy: Secondary | ICD-10-CM | POA: Diagnosis not present

## 2019-06-24 ENCOUNTER — Encounter: Payer: Self-pay | Admitting: Family

## 2019-06-24 ENCOUNTER — Ambulatory Visit (INDEPENDENT_AMBULATORY_CARE_PROVIDER_SITE_OTHER): Payer: BC Managed Care – PPO | Admitting: Family

## 2019-06-24 ENCOUNTER — Other Ambulatory Visit: Payer: Self-pay

## 2019-06-24 VITALS — BP 100/68 | HR 113 | Temp 98.3°F | Resp 16 | Ht 72.0 in | Wt 159.0 lb

## 2019-06-24 DIAGNOSIS — R739 Hyperglycemia, unspecified: Secondary | ICD-10-CM

## 2019-06-24 DIAGNOSIS — I2699 Other pulmonary embolism without acute cor pulmonale: Secondary | ICD-10-CM | POA: Diagnosis not present

## 2019-06-24 DIAGNOSIS — E079 Disorder of thyroid, unspecified: Secondary | ICD-10-CM

## 2019-06-24 DIAGNOSIS — T380X5A Adverse effect of glucocorticoids and synthetic analogues, initial encounter: Secondary | ICD-10-CM | POA: Diagnosis not present

## 2019-06-24 DIAGNOSIS — C73 Malignant neoplasm of thyroid gland: Secondary | ICD-10-CM

## 2019-06-24 NOTE — Patient Instructions (Addendum)
Please add ensure one can 3 times daily.  Please complete lab work prior to leaving.

## 2019-06-24 NOTE — Progress Notes (Signed)
Subjective:    Patient ID: Tyrone Washington, male    DOB: [birth year], 64 y.o.   MRN: [id number]  HPI   Patient is a 64 year old male who presents today for hospital follow-up.  He has a longstanding history of neck mass. I first saw him 08/29/10.  This was following a partial thyroidectomy 1 year prior which was performed by Dr. Redmond Washington.  Path showed microfollicular adenoma. Referrals were made to ENT 08/30/10, 07/07/11, 10/11/15, 10/24/17, and 03/04/19.  Unfortunately, pt did not pursue further surgical evaluation of his enlarging neck mass. Most recently, his neck mass was evaluated by Dr. Hendricks Washington ENT at [facility] in [place]. Following that visit, the plan was for CT neck/surgical removal.   He was admitted on May 12, 2019 from the emergency department.  Prior to his visit to the emergency department he saw neurology for consultation.  Upon further evaluation neurology felt that his symptoms were likely myelopathy with compressive pathology involving the lumbar spine.  MRI of the lumbar spine performed on March 1 noted 2 enhancing osseous lesions highly suspicious for metastatic disease.  Note was made of abnormal signal at T7 and T10 as well as T6 and T9 felt to be consistent with metastatic disease on MRI of the thoracic spine.  There was noted to be cord compression and abnormal cord signal at the inferior T9 and T10 level.  Neurosurgery was consulted.  He underwent thoracic 10 laminectomy for tumor.  Bone fragment of metastasis pathology showed thyroid type tissue consistent with metastatic follicular carcinoma.  Following his discharge from the hospital he was admitted to inpatient rehab.He completed inpatient rehab and was d/c'd home with home health PT.  He reports that he feels like his LE strength is improving. He is able to walk with his walker.   He is having radiation to the spine- he completed 3 weeks of radiation and his last treatment was yesterday.   He reports that walking is  not a problem, still has some leg weakness but improving.    He is scheduled at [facility] for resection of his thyroid mass.   Wt Readings from Last 3 Encounters:  06/24/19 159 lb (72.1 kg)  06/05/19 164 lb (74.4 kg)  05/24/19 165 lb 2 oz (74.9 kg)    Lab Results  Component Value Date   TSH 1.93 04/08/2019    Review of Systems     Objective:   Physical Exam Constitutional:      General: He is not in acute distress.    Appearance: He is well-developed.     Comments: Extremely large neck mass which protrudes further than his chin and past natural neck line bilaterally.  HENT:     Head: Normocephalic and atraumatic.  Cardiovascular:     Rate and Rhythm: Normal rate and regular rhythm.     Heart sounds: No murmur.  Pulmonary:     Effort: Pulmonary effort is normal. No respiratory distress.     Breath sounds: Normal breath sounds. No wheezing or rales.  Skin:    General: Skin is warm and dry.  Neurological:     Mental Status: He is alert and oriented to person, place, and time.     Deep Tendon Reflexes:     Reflex Scores:      Patellar reflexes are 2+ on the right side and 2+ on the left side.    Comments: Bilateral LE strength is 4-5/5 (slightly diminished bilaterally).   Psychiatric:  Behavior: Behavior normal.        Thought Content: Thought content normal.           Assessment & Plan:  Metastatic Follicular Carcinoma- s/p T10 laminectomy and subsequent completion of radiation to his spinal lesion.  I have placed a formal referral to Dr. Alen Washington (medical oncologist) who consulted in the hospital.    Pulmonary Embolism- maintained on xarelto.  Clinically stable.   Neck mass- presumably due to enlarging follicular carcinoma. He is now having compressive symptoms and finding it difficult to eat. He is able to tolerate liquids and soft foods. We discussed adding ensure 1 can TID to help support his nutrion. He plans to arrange follow up with ENT to discuss  surgery. (I see per care everywhere that ENT team is working to get him scheduled for follow up as well). Unfortunately his recent PE/anticoagulation may complicate the timing of his  surgery.  Will defer anticoagulation to heme/onc.    TSH is  WNL.   Lab Results  Component Value Date   TSH 0.70 06/24/2019      50 minutes spent on today's visit. Time was spent counseling patient, reviewing patient record and coordinating patient's care.  This visit occurred during the SARS-CoV-2 public health emergency.  Safety protocols were in place, including screening questions prior to the visit, additional usage of staff PPE, and extensive cleaning of exam room while observing appropriate contact time as indicated for disinfecting solutions.

## 2019-06-25 LAB — BASIC METABOLIC PANEL
BUN: 14 mg/dL (ref 6–23)
CO2: 26 mEq/L (ref 19–32)
Calcium: 9.1 mg/dL (ref 8.4–10.5)
Chloride: 104 mEq/L (ref 96–112)
Creatinine, Ser: 1.06 mg/dL (ref 0.40–1.50)
GFR: 84.71 mL/min (ref >60.00)
Glucose, Bld: 109 mg/dL — ABNORMAL HIGH (ref 70–99)
Potassium: 4 mEq/L (ref 3.5–5.1)
Sodium: 137 mEq/L (ref 135–145)

## 2019-06-25 LAB — TSH: TSH: 0.7 u[IU]/mL (ref 0.35–4.50)

## 2019-06-26 ENCOUNTER — Telehealth: Payer: Self-pay | Admitting: Family

## 2019-06-26 NOTE — Telephone Encounter (Signed)
Please contact pt and let him know I would like him to call to schedule appointments as follows:  Dr. Alen Blew (oncology) [phone]) [phone] Dr. Kandace Parkins (ENT) [phone]

## 2019-06-26 NOTE — Telephone Encounter (Signed)
Patient advised to call for follow up appointments, names and phone numbers provided.

## 2019-07-01 ENCOUNTER — Other Ambulatory Visit: Payer: Self-pay

## 2019-07-01 ENCOUNTER — Ambulatory Visit (INDEPENDENT_AMBULATORY_CARE_PROVIDER_SITE_OTHER): Payer: BC Managed Care – PPO

## 2019-07-01 DIAGNOSIS — E538 Deficiency of other specified B group vitamins: Secondary | ICD-10-CM

## 2019-07-01 MED ORDER — CYANOCOBALAMIN 1000 MCG/ML IJ SOLN
1000.0000 ug | Freq: Once | INTRAMUSCULAR | Status: AC
  Administered 2019-07-01: 1000 ug via INTRAMUSCULAR

## 2019-07-01 NOTE — Progress Notes (Addendum)
Pt here for monthly B12 injection per Tyrone Washington  B12 1088mcg given IM, and pt tolerated injection well.  Patient will call to schedule next injection  CMA note reviewed and agree.  Nance Pear NP

## 2019-07-08 ENCOUNTER — Telehealth: Payer: Self-pay | Admitting: Family

## 2019-07-08 ENCOUNTER — Telehealth: Payer: Self-pay | Admitting: Oncology

## 2019-07-08 NOTE — Telephone Encounter (Signed)
A hospital follow up appt has been scheduled for Tyrone Washington to see Dr. Alen Blew on 5/5 at 1pm. Pt has been called and made aware to arrive 15 minutes early.

## 2019-07-08 NOTE — Telephone Encounter (Signed)
Spoke to pt, he states that he is scheduled to see surgeon at [facility] tomorrow. Also, I reviewed the case with Dr. Alen Blew and he states his office will reach out to the patient to make she he gets scheduled with them. Pt is aware of the importance of both appointments and is advised to call me if he has any issues getting scheduled. Pt verbalizes understanding.

## 2019-07-10 DIAGNOSIS — C73 Malignant neoplasm of thyroid gland: Secondary | ICD-10-CM | POA: Diagnosis not present

## 2019-07-14 ENCOUNTER — Telehealth: Payer: Self-pay

## 2019-07-14 NOTE — Telephone Encounter (Signed)
TC from patient stating that he needs to reschedule his appointment with doctor Shadad on 07/16/19 to another available date due to him having another appointment that day  With someone else at the same time. Sent schedule message to get patients appointment rescheduled.

## 2019-07-16 ENCOUNTER — Inpatient Hospital Stay: Payer: BC Managed Care – PPO | Admitting: Oncology

## 2019-07-16 DIAGNOSIS — C73 Malignant neoplasm of thyroid gland: Secondary | ICD-10-CM | POA: Diagnosis not present

## 2019-07-16 DIAGNOSIS — C7802 Secondary malignant neoplasm of left lung: Secondary | ICD-10-CM | POA: Diagnosis not present

## 2019-07-16 DIAGNOSIS — C7951 Secondary malignant neoplasm of bone: Secondary | ICD-10-CM | POA: Diagnosis not present

## 2019-07-16 DIAGNOSIS — C7801 Secondary malignant neoplasm of right lung: Secondary | ICD-10-CM | POA: Diagnosis not present

## 2019-07-23 ENCOUNTER — Other Ambulatory Visit: Payer: Self-pay

## 2019-07-23 ENCOUNTER — Inpatient Hospital Stay: Payer: BLUE CROSS/BLUE SHIELD | Attending: Oncology | Admitting: Oncology

## 2019-07-23 VITALS — BP 101/76 | HR 96 | Temp 98.2°F | Resp 16 | Wt 166.5 lb

## 2019-07-23 DIAGNOSIS — Z79899 Other long term (current) drug therapy: Secondary | ICD-10-CM | POA: Insufficient documentation

## 2019-07-23 DIAGNOSIS — C7951 Secondary malignant neoplasm of bone: Secondary | ICD-10-CM | POA: Insufficient documentation

## 2019-07-23 DIAGNOSIS — C7949 Secondary malignant neoplasm of other parts of nervous system: Secondary | ICD-10-CM

## 2019-07-23 DIAGNOSIS — C7800 Secondary malignant neoplasm of unspecified lung: Secondary | ICD-10-CM | POA: Insufficient documentation

## 2019-07-23 DIAGNOSIS — Z923 Personal history of irradiation: Secondary | ICD-10-CM | POA: Diagnosis not present

## 2019-07-23 DIAGNOSIS — Z7952 Long term (current) use of systemic steroids: Secondary | ICD-10-CM | POA: Insufficient documentation

## 2019-07-23 DIAGNOSIS — Z7901 Long term (current) use of anticoagulants: Secondary | ICD-10-CM | POA: Insufficient documentation

## 2019-07-23 DIAGNOSIS — C73 Malignant neoplasm of thyroid gland: Secondary | ICD-10-CM | POA: Insufficient documentation

## 2019-07-23 NOTE — Progress Notes (Signed)
Hematology and Oncology Follow Up Visit  SKLYER BAGE [id number] 21-Jul-1955 64 y.o. 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  He presented with a thyroid mass and documented spinal metastasis and pulmonary involvement.   Prior Therapy:  He is status post T9 and T10 laminectomy and resection of an epidural tumor completed on 05/13/2019. He is status post radiation therapy to the thoracic spine completed in March 2021.  Current therapy: Under evaluation for thyroid surgery.  Interim History: Mr. Somero presents today for a follow-up visit.  He is a 64 year old man I saw in consultation while he is hospitalized and underwent thoracic surgery.  He presented with cord compression and the biopsy showed advanced thyroid disease.  He has completed surgery, radiation and rehabilitation from his laminectomy.  He is ambulating with the help of a cane without any falls or syncope.  Denies any recent hospitalizations or illnesses.     Medications: I have reviewed the patient's current medications.  Current Outpatient Medications  Medication Sig Dispense Refill  . predniSONE (DELTASONE) 1 MG tablet 4 tablets p.o. every 12 hours x1 day then 2 tabs p.o. every 12 hours x3 days then 2 tabs daily x3 days and stop 26 tablet 0  . Rivaroxaban (XARELTO) 15 MG TABS tablet Take 1 tablet (15 mg total) by mouth 2 (two) times daily with a meal for 17 days. 34 tablet 0  . rivaroxaban (XARELTO) 20 MG TABS tablet Take 1 tablet (20 mg total) by mouth daily with supper. 30 tablet 1  . valACYclovir (VALTREX) 500 MG tablet Take 1 tablet (500 mg total) by mouth daily. (Patient taking differently: Take 500 mg by mouth daily as needed (outbreaks). ) 30 tablet 5   No current facility-administered medications for this visit.     Allergies: No Known Allergies    Physical Exam: Blood  pressure 101/76, pulse 96, temperature 98.2 F (36.8 C), temperature source Temporal, resp. rate 16, weight 166 lb 8 oz (75.5 kg), SpO2 99 %. ECOG: 1 General appearance: alert and cooperative appeared without distress. Head: Normocephalic, without obvious abnormality.  Large thyroid mass noted. Oropharynx: No oral thrush or ulcers. Eyes: No scleral icterus.  Pupils are equal and round reactive to light. Lymph nodes: Cervical, supraclavicular, and axillary nodes normal. Heart:regular rate and rhythm, S1, S2 normal, no murmur, click, rub or gallop Lung:chest clear, no wheezing, rales, normal symmetric air entry Abdomin: soft, non-tender, without masses or organomegaly. Neurological: No motor, sensory deficits.  Intact deep tendon reflexes. Skin: No rashes or lesions.  No ecchymosis or petechiae. Musculoskeletal: No joint deformity or effusion. Psychiatric: Mood and affect are appropriate.    Lab Results: Lab Results  Component Value Date   WBC 18.7 (H) 05/26/2019   HGB 9.1 (L) 05/26/2019   HCT 28.8 (L) 05/26/2019   MCV 95.0 05/26/2019   PLT 272 05/26/2019     Chemistry      Component Value Date/Time   NA 137 06/24/2019 1604   K 4.0 06/24/2019 1604   CL 104 06/24/2019 1604   CO2 26 06/24/2019 1604   BUN 14 06/24/2019 1604   CREATININE 1.06 06/24/2019 1604      Component Value Date/Time   CALCIUM 9.1 06/24/2019 1604   ALKPHOS 48 05/26/2019 0532   AST 16 05/26/2019 0532   ALT 24 05/26/2019 0532   BILITOT 0.3 05/26/2019 0532       Impression and  Plan:  64 year old man with:  1.  Advanced thyroid cancer presented with a large thyroid mass with advanced disease to the bone and pulmonary involvement.  The natural course of this disease was reviewed today with the patient.  His case was discussed with [place] at [facility].  He understands he will require multidisciplinary approach of management of his advanced malignancy.  He will require total  thyroidectomy in neck dissection followed by radioiodine ablation.  He is already under evaluation to undergo these treatments at [facility].  The role for systemic therapy was reviewed today and it would be an option in the future as a salvage option if he develops worsening disease after endocrine therapy.  2.  Follow-up: Will be in the next few months to follow his progress.   30  minutes were dedicated to this visit. The time was spent on reviewing imaging studies, discussing treatment options, discussing complications related to his disease and answering questions regarding future plan.    Zola Button, MD 5/12/202111:18 AM

## 2019-07-24 ENCOUNTER — Telehealth: Payer: Self-pay | Admitting: Radiation Oncology

## 2019-07-24 NOTE — Progress Notes (Signed)
  Radiation Oncology         [phone] ________________________________  Name: Tyrone Washington MRN: [id number]  Date: 06/23/2019  DOB: 1955/06/16  End of Treatment Note  Diagnosis:   Bone metastasis     Indication for treatment::  palliative       Radiation treatment dates:   06/03/2019 06/23/2019  Site/dose:   Patient was treated to the sacrum to a dose of 35 Gray fractions per fraction.  This was carried out using a 4 field isodose plan.  Narrative: The patient tolerated radiation treatment relatively well.     Plan: The patient has completed radiation treatment. The patient will return to radiation oncology clinic for routine followup in one month. I advised the patient to call or return sooner if they have any questions or concerns related to their recovery or treatment. ________________________________  Jodelle Gross, M.D., Ph.D.

## 2019-07-24 NOTE — Telephone Encounter (Signed)
  Radiation Oncology         [phone] ________________________________  Name: Tyrone Washington MRN: [id number]  Date of Service: 07/24/2019  DOB: 1955-12-10  Post Treatment Telephone Note  Diagnosis:  Probable advanced malignancy, question thyroid primary who presented with thoracic cord compression and bony disease in remaining segments of the thoracic and sacral spine.   Interval Since Last Radiation: 4 weeks   06/03/19-06/23/19: The patient's T6-T11 spine and S1 and sacral ala were treated to 37.5 Gy in 15 fractions.  Narrative:  The patient was contacted today for routine follow-up. During treatment he did very well with radiotherapy and did not have significant desquamation. He reports he is doing very well. His pain is significantly improved and he's up walking on his own. He uses a cane for extra stability. Otherwise he's quite pleased and is planning his thyroidectomy next Friday with Dr. Hendricks Limes and will follow up with Dr. Alen Blew as well.  Impression/Plan: 1. 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 The patient has been doing well since completion of radiotherapy. We will see him back as needed moving forward.    Carola Rhine, PAC

## 2019-08-04 MED ORDER — ONDANSETRON HCL 4 MG/2ML IJ SOLN
4.00 | INTRAMUSCULAR | Status: DC
Start: ? — End: 2019-08-04

## 2019-08-04 MED ORDER — BENZOCAINE-MENTHOL 6-10 MG MT LOZG
1.00 | LOZENGE | OROMUCOSAL | Status: DC
Start: ? — End: 2019-08-04

## 2019-08-04 MED ORDER — ACETAMINOPHEN 500 MG PO TABS
1000.00 | ORAL_TABLET | ORAL | Status: DC
Start: 2019-08-04 — End: 2019-08-04

## 2019-08-04 MED ORDER — BACITRACIN ZINC 500 UNIT/GM EX OINT
TOPICAL_OINTMENT | CUTANEOUS | Status: DC
Start: 2019-08-04 — End: 2019-08-04

## 2019-08-04 MED ORDER — ENOXAPARIN SODIUM 40 MG/0.4ML ~~LOC~~ SOLN
40.00 | SUBCUTANEOUS | Status: DC
Start: 2019-08-05 — End: 2019-08-04

## 2019-08-04 MED ORDER — OXYCODONE HCL 5 MG PO TABS
5.00 | ORAL_TABLET | ORAL | Status: DC
Start: ? — End: 2019-08-04

## 2019-08-04 MED ORDER — CALCIUM CARBONATE 1250 (500 CA) MG PO CHEW
500.00 | CHEWABLE_TABLET | ORAL | Status: DC
Start: ? — End: 2019-08-04

## 2019-08-04 MED ORDER — LEVOTHYROXINE SODIUM 75 MCG PO TABS
150.00 | ORAL_TABLET | ORAL | Status: DC
Start: 2019-08-05 — End: 2019-08-04

## 2019-08-29 MED FILL — LEVOTHYROXINE SODIUM 150 MC: 150 | 30 days supply | Qty: 30 | Fill #0

## 2019-09-26 MED FILL — LEVOTHYROXINE SODIUM 150 MC: 150 | 30 days supply | Qty: 30 | Fill #1

## 2019-10-24 MED FILL — ONDANSETRON ODT 8 MG TABLET: 8 | 7 days supply | Qty: 20 | Fill #0

## 2019-10-29 ENCOUNTER — Other Ambulatory Visit (HOSPITAL_BASED_OUTPATIENT_CLINIC_OR_DEPARTMENT_OTHER): Payer: Self-pay | Admitting: Otolaryngology

## 2019-10-29 MED FILL — LEVOTHYROXINE SODIUM 150 MC: 150 | 30 days supply | Qty: 30 | Fill #0

## 2019-11-28 MED FILL — LEVOTHYROXINE SODIUM 150 MC: 150 | 30 days supply | Qty: 30 | Fill #1

## 2019-12-23 MED FILL — LEVOTHYROXINE SODIUM 150 MC: 150 | 30 days supply | Qty: 30 | Fill #2

## 2020-01-15 DIAGNOSIS — N401 Enlarged prostate with lower urinary tract symptoms: Secondary | ICD-10-CM | POA: Insufficient documentation

## 2020-02-02 ENCOUNTER — Other Ambulatory Visit: Payer: Self-pay

## 2020-02-02 ENCOUNTER — Ambulatory Visit (INDEPENDENT_AMBULATORY_CARE_PROVIDER_SITE_OTHER): Payer: Medicare Other | Admitting: Family

## 2020-02-02 ENCOUNTER — Other Ambulatory Visit (HOSPITAL_BASED_OUTPATIENT_CLINIC_OR_DEPARTMENT_OTHER): Payer: Self-pay | Admitting: Otolaryngology

## 2020-02-02 VITALS — BP 114/79 | HR 86 | Temp 97.5°F | Resp 16 | Ht 73.0 in | Wt 164.0 lb

## 2020-02-02 DIAGNOSIS — C73 Malignant neoplasm of thyroid gland: Secondary | ICD-10-CM

## 2020-02-02 DIAGNOSIS — E890 Postprocedural hypothyroidism: Secondary | ICD-10-CM

## 2020-02-02 DIAGNOSIS — Z7185 Encounter for immunization safety counseling: Secondary | ICD-10-CM

## 2020-02-02 DIAGNOSIS — E538 Deficiency of other specified B group vitamins: Secondary | ICD-10-CM

## 2020-02-02 DIAGNOSIS — E785 Hyperlipidemia, unspecified: Secondary | ICD-10-CM | POA: Diagnosis not present

## 2020-02-02 DIAGNOSIS — Z9889 Other specified postprocedural states: Secondary | ICD-10-CM

## 2020-02-02 LAB — LIPID PANEL
Cholesterol: 233 mg/dL — ABNORMAL HIGH (ref 0–200)
HDL: 53.9 mg/dL (ref >39.00)
LDL Cholesterol: 161 mg/dL — ABNORMAL HIGH (ref 0–99)
NonHDL: 179.09
Total CHOL/HDL Ratio: 4
Triglycerides: 90 mg/dL (ref 0.0–149.0)
VLDL: 18 mg/dL (ref 0.0–40.0)

## 2020-02-02 LAB — VITAMIN B12: Vitamin B-12: 422 pg/mL (ref 211–911)

## 2020-02-02 MED ORDER — CYANOCOBALAMIN 1000 MCG/ML IJ SOLN
1000.0000 ug | Freq: Once | INTRAMUSCULAR | Status: AC
  Administered 2020-02-02: 1000 ug via INTRAMUSCULAR

## 2020-02-02 MED FILL — LEVOTHYROXINE SODIUM 150 MC: 150 | 30 days supply | Qty: 30 | Fill #0

## 2020-02-02 NOTE — Patient Instructions (Signed)
Please complete lab work prior to leaving.   

## 2020-02-02 NOTE — Progress Notes (Signed)
Subjective:    Patient ID: Tyrone Washington, male    DOB: 1955-10-08, 64 y.o.   MRN: [id number]  HPI   Patient is a 64 year old male with history of metastatic follicular thyroid cancer, who presents today for follow-up.  He underwent a T10 laminectomy due to bony metastasis.  Subsequently, he underwent removal of his thyroid.  Pathology showed follicular thyroid carcinoma which was at least 7 cm in diameter and was involved being the left lobe of the thyroid and isthmus with angioinvasion as well.  He had 6 - lymph nodes removed during central neck dissection.  He underwent radioactive iodine ablation in August 2021.  Post therapy scan showed widely metastatic follicular thyroid carcinoma.  He is following with endocrinology and is maintained on levothyroxine 150 mcg p.o. daily.  Endocrinology is considering Zometa treatment for his bone mets.  Reports that his LE weakness has improved. States that he did PT which was helpful.   Hyperlipidemia- not on statin.  Lab Results  Component Value Date   CHOL 233 (H) 02/02/2020   HDL 53.90 02/02/2020   LDLCALC 161 (H) 02/02/2020   TRIG 90.0 02/02/2020   CHOLHDL 4 02/02/2020    b12 deficiency- due for b12 injection.   Hx of genital herpes- No recent outbreaks.  Continues suppressive therapy with valtrex 500mg  once daily.  Wt Readings from Last 3 Encounters:  02/02/20 164 lb (74.4 kg)  07/23/19 166 lb 8 oz (75.5 kg)  06/24/19 159 lb (72.1 kg)    Review of Systems   See HPI  Past Medical History:  Diagnosis Date  . Allergy    allergic rhinitis  . Arthritis   . B12 deficiency 04/10/2019  . History of chicken pox   . Hyperlipidemia    no meds now     Social History   Socioeconomic History  . Marital status: Single    Spouse name: Not on file  . Number of children: 2  . Years of education: Not on file  . Highest education level: Not on file  Occupational History    Employer: [employer]  Tobacco Use  . Smoking status: Current  Every Day Smoker  . Smokeless tobacco: Never Used  . Tobacco comment: Smokes 1 - 2 cigars daily  Vaping Use  . Vaping Use: Never used  Substance and Sexual Activity  . Alcohol use: Yes    Alcohol/week: 7.0 standard drinks    Types: 7 Cans of beer per week  . Drug use: Never  . Sexual activity: Not on file  Other Topics Concern  . 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     Denies hobbies   No pets.    Social Determinants of Health   Financial Resource Strain:   . Difficulty of Paying Living Expenses: Not on file  Food Insecurity:   . Worried About Charity fundraiser in the Last Year: Not on file  . Ran Out of Food in the Last Year: Not on file  Transportation Needs:   . Lack of Transportation (Medical): Not on file  . Lack of Transportation (Non-Medical): Not on file  Physical Activity:   . Days of Exercise per Week:  Not on file  . Minutes of Exercise per Session: Not on file  Stress:   . Feeling of Stress : Not on file  Social Connections:   . Frequency of Communication with Friends and Family: Not on file  . Frequency of Social Gatherings with Friends and Family: Not on file  . Attends Religious Services: Not on file  . Active Member of Clubs or Organizations: Not on file  . Attends Archivist Meetings: Not on file  . Marital Status: Not on file  Intimate Partner Violence:   . Fear of Current or Ex-Partner: Not on file  . Emotionally Abused: Not on file  . Physically Abused: Not on file  . Sexually Abused: Not on file    Past Surgical History:  Procedure Laterality Date  . COLONOSCOPY    . LAMINECTOMY N/A 05/13/2019   Procedure: THORACIC TEN LAMINECTOMY FOR TUMOR;  Surgeon: Earnie Larsson, MD;  Location: [facility];  Service: Neurosurgery;  Laterality: N/A;  . THYROID SURGERY     "Years ago"    Family History  Problem Relation Age of Onset  . Heart disease Father        patient is unsure of history  . CVA Father   . Heart attack Neg Hx   . Colon cancer Neg Hx   . Esophageal cancer Neg Hx   . Rectal cancer Neg Hx   . Stomach cancer Neg Hx     No Known Allergies  Current Outpatient Medications on File Prior to Visit  Medication Sig Dispense Refill  . levothyroxine (SYNTHROID) 150 MCG tablet Take 150 mcg by mouth every morning.    . valACYclovir (VALTREX) 500 MG tablet Take 1 tablet (500 mg total) by mouth daily. (Patient taking differently: Take 500 mg by mouth daily as needed (outbreaks). ) 30 tablet 5   No current facility-administered medications on file prior to visit.    BP 114/79 (BP Location: Left Arm, Patient Position: Sitting, Cuff Size: Small)   Pulse 86   Temp (!) 97.5 F (36.4 C) (Temporal)   Resp 16   Ht 6\' 1"  (1.854 m)   Wt 164 lb (74.4 kg)   SpO2 100%   BMI 21.64 kg/m        Objective:   Physical Exam Constitutional:      General: He is not in acute distress.    Appearance: He is well-developed.  HENT:     Head: Normocephalic and atraumatic.  Neck:     Comments: Previously large thyroid mass has been resected. Scar noted at base of left anterior neck.   Cardiovascular:     Rate and Rhythm: Normal rate and regular rhythm.     Heart sounds: No murmur heard.   Pulmonary:     Effort: Pulmonary effort is normal. No respiratory distress.     Breath sounds: Normal breath sounds. No wheezing or rales.  Skin:    General: Skin is warm and dry.  Neurological:     Mental Status: He is alert and oriented to person, place, and time.  Psychiatric:        Behavior: Behavior normal.        Thought Content: Thought content normal.           Assessment & Plan:  Metastatic Follicular Carcinoma of the Thyroid- s/p resection of thyroid mass and RIA.  Management  per endocrinology.  Hypothyroid- s/p RIA.  Maintained on synthroid which is being monitored by endocrinology.  B12 deficiency-  b12 injection give today. Follow b12 level obtained which was WNL.  Will plan to continue oral b12 1060mcg PO once daily otc.  See phone note.  S/p T10 laminectomy for spinal tumor- he is now ambulating without difficulty.  Immunization counseling- pt counseled on the importance of covid-19 and flu vaccination. He declines both at this time.  This visit occurred during the SARS-CoV-2 public health emergency.  Safety protocols were in place, including screening questions prior to the visit, additional usage of staff PPE, and extensive cleaning of exam room while observing appropriate contact time as indicated for disinfecting solutions.

## 2020-02-03 ENCOUNTER — Encounter: Payer: Self-pay | Admitting: Family

## 2020-02-03 ENCOUNTER — Telehealth: Payer: Self-pay | Admitting: Family

## 2020-02-03 MED ORDER — VITAMIN B-12 1000 MCG PO TABS: 1000.0000 ug | ORAL_TABLET | Freq: Every day | ORAL | Status: AC

## 2020-02-03 NOTE — Telephone Encounter (Signed)
Also, please schedule pt a medicare wellness visit with the RN.

## 2020-02-03 NOTE — Telephone Encounter (Signed)
Please advise pt that his b12 level looks good. I don't think we need to do monthly b12 injections. I would recommend that he add oral b12 106mcg PO daily available OTC.

## 2020-02-03 NOTE — Telephone Encounter (Signed)
Patient advised of results and to continue otc b12. He will call back to set up wellness visit.

## 2020-02-04 ENCOUNTER — Other Ambulatory Visit (HOSPITAL_BASED_OUTPATIENT_CLINIC_OR_DEPARTMENT_OTHER): Payer: Self-pay | Admitting: Endocrinology

## 2020-03-04 MED FILL — LEVOTHYROXINE SODIUM 150 MC: 150 | 30 days supply | Qty: 30 | Fill #1

## 2020-04-01 MED FILL — LEVOTHYROXINE SODIUM 150 MC: 150 | 30 days supply | Qty: 30 | Fill #2

## 2020-04-16 ENCOUNTER — Other Ambulatory Visit (HOSPITAL_BASED_OUTPATIENT_CLINIC_OR_DEPARTMENT_OTHER): Payer: Self-pay

## 2020-04-16 MED FILL — ONDANSETRON ODT 8 MG TABLET: 8 | 7 days supply | Qty: 20 | Fill #0

## 2020-04-30 MED FILL — LEVOTHYROXINE SODIUM 150 MC: 150 | 60 days supply | Qty: 60 | Fill #0

## 2020-06-30 ENCOUNTER — Other Ambulatory Visit (HOSPITAL_BASED_OUTPATIENT_CLINIC_OR_DEPARTMENT_OTHER): Payer: Self-pay

## 2020-06-30 MED FILL — Levothyroxine Sodium Tab 150 MCG: ORAL | 30 days supply | Qty: 30 | Fill #0 | Status: AC

## 2020-07-29 ENCOUNTER — Other Ambulatory Visit (HOSPITAL_BASED_OUTPATIENT_CLINIC_OR_DEPARTMENT_OTHER): Payer: Self-pay

## 2020-07-29 MED FILL — Levothyroxine Sodium Tab 150 MCG: ORAL | 30 days supply | Qty: 30 | Fill #1 | Status: AC

## 2020-08-06 IMAGING — CR DG THORACOLUMBAR SPINE 2V
2 series · 2 of 2 positions shown · non-contrast
Comparison: May 12, 2019.

CLINICAL DATA: T10 laminectomy.

EXAM:
THORACOLUMBAR SPINE 1V

[lateral (1 of 2)]
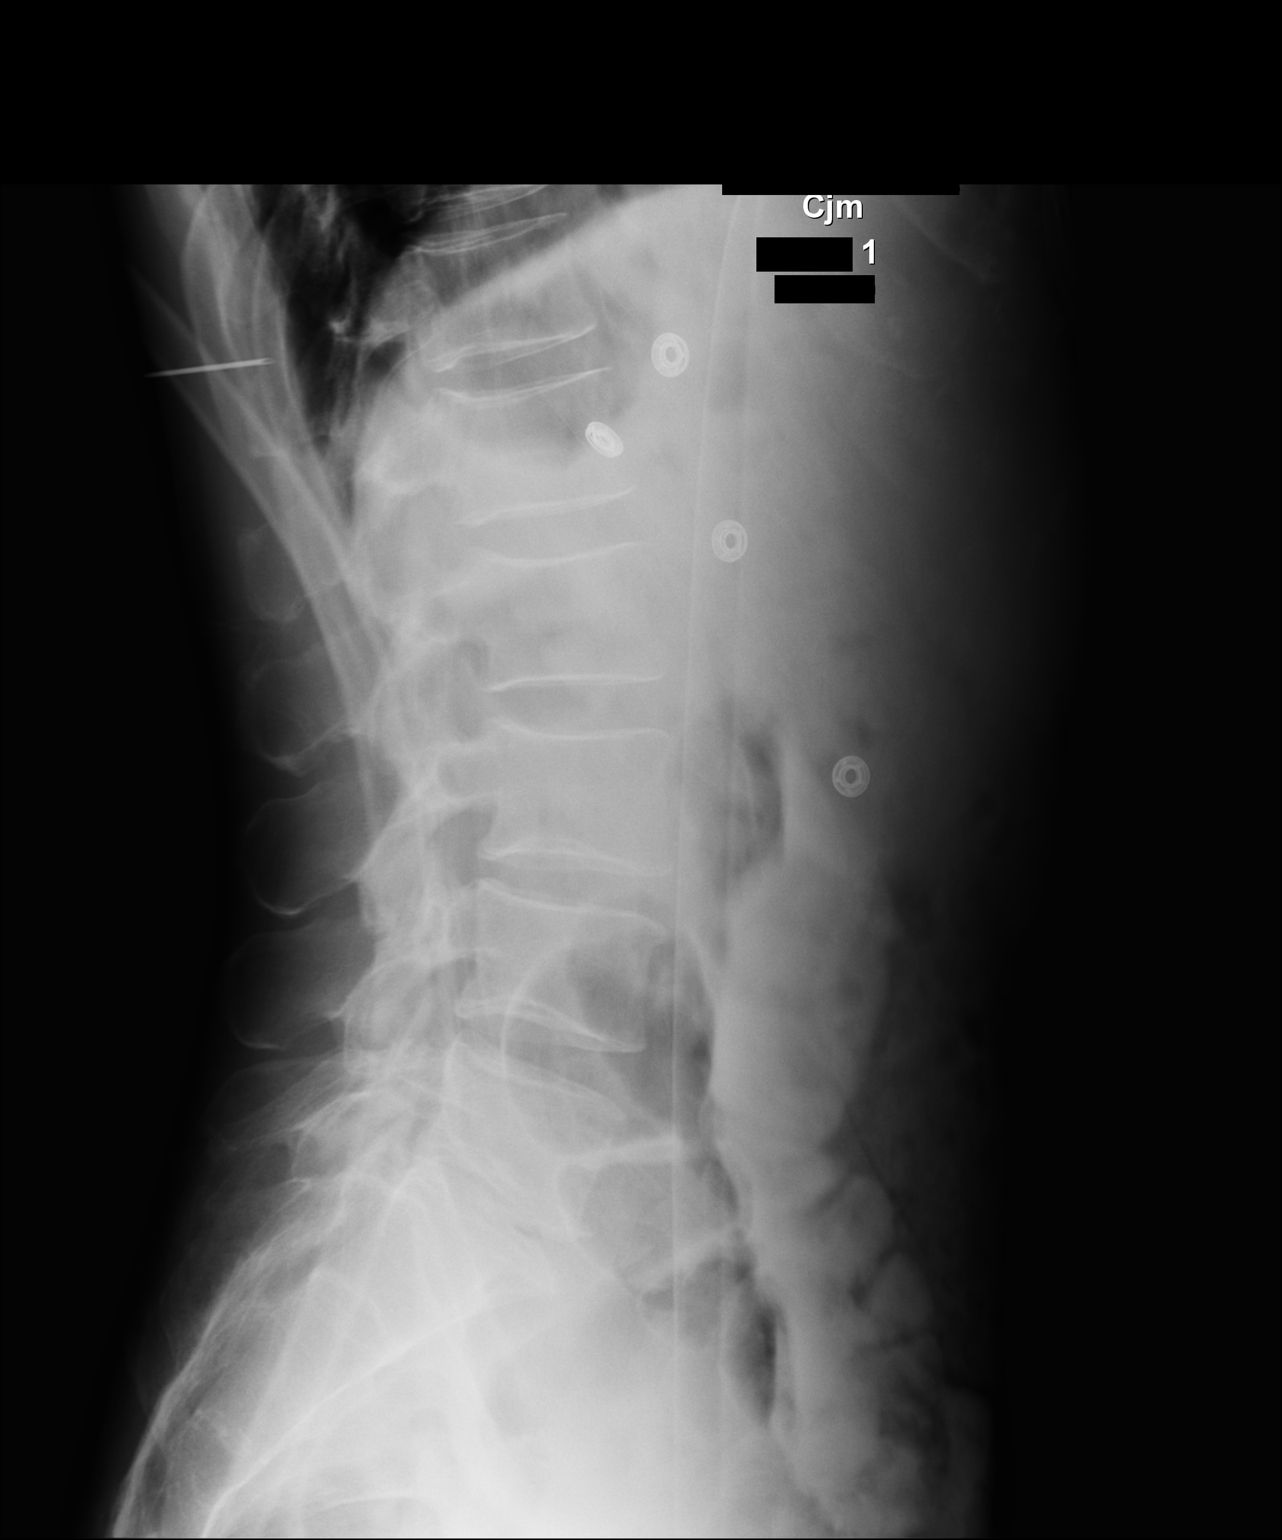

[lateral (2 of 2)]
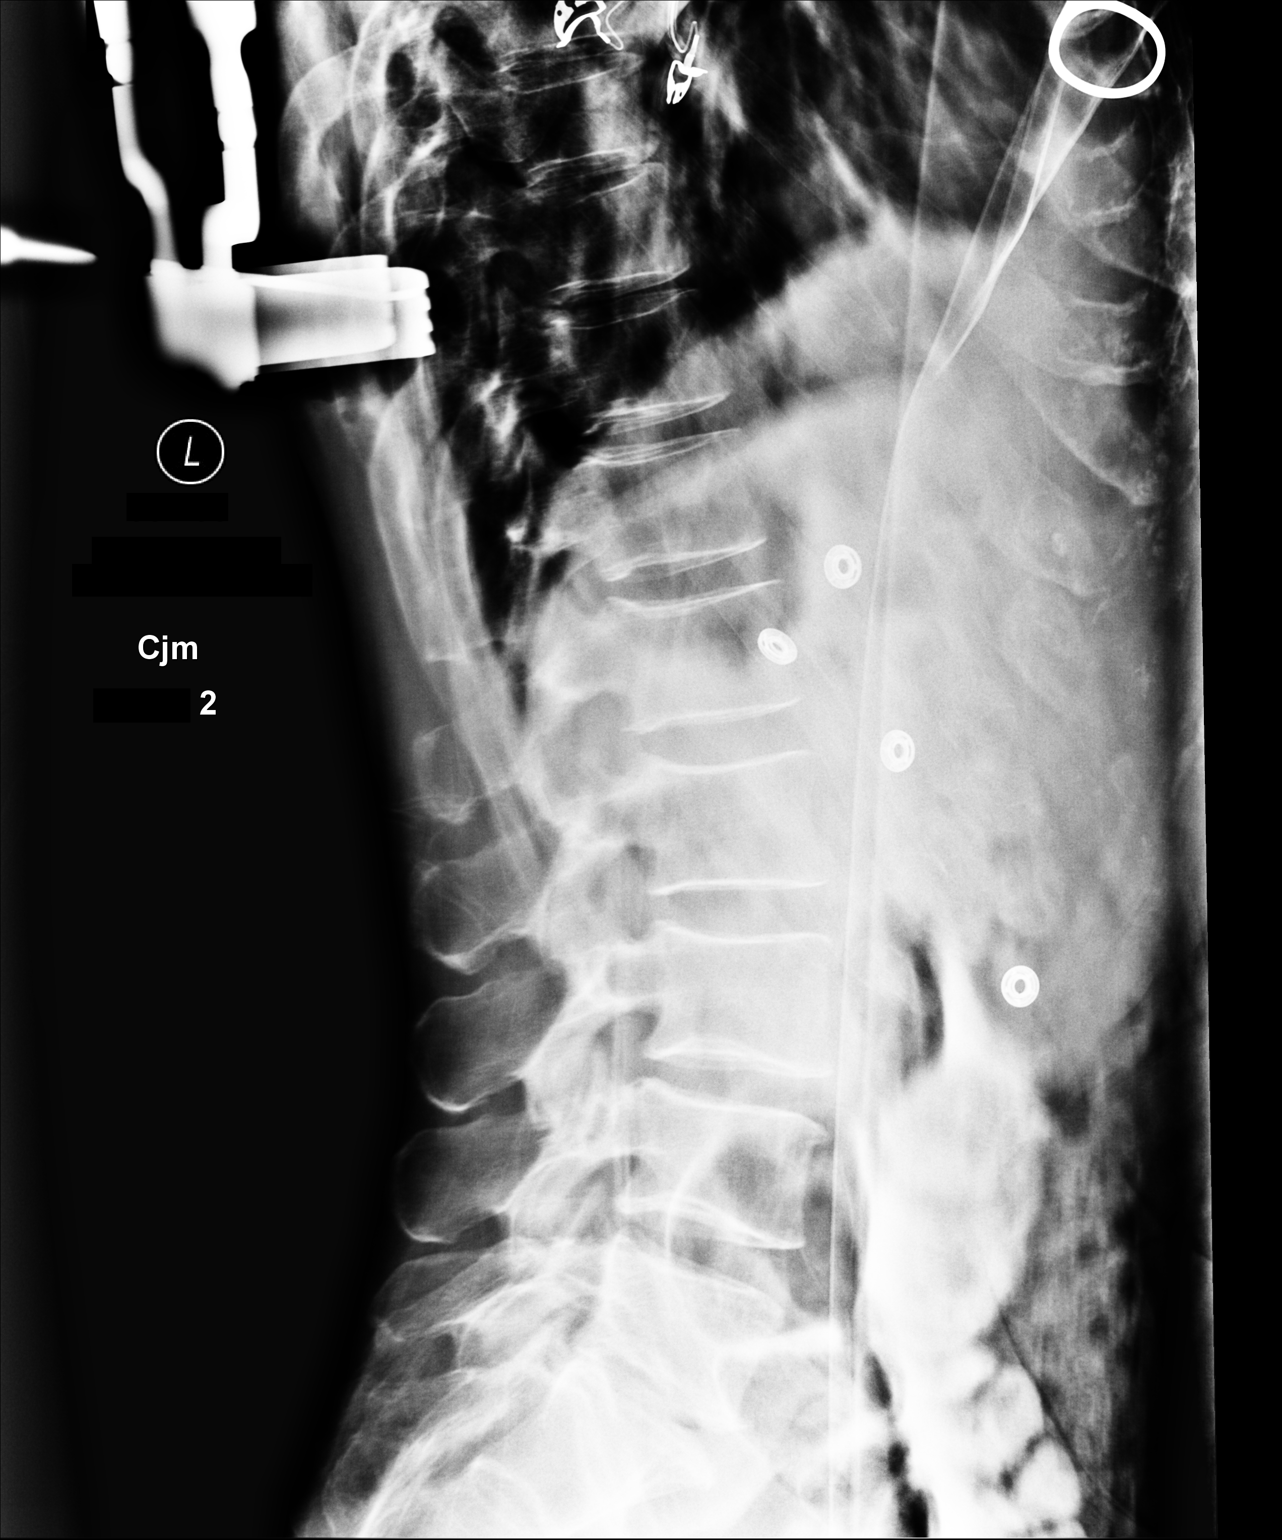

[2 of 2 positions shown; findings below may reference images not displayed]

FINDINGS: Two intraoperative cross-table lateral projections were obtained of
the lumbar spine. The first image demonstrates surgical probe
directed toward T12 level. The second image demonstrates surgical
retractor posterior to T10-11 disc space.
IMPRESSION: Surgical localization as described above.

## 2020-08-06 IMAGING — CT CT ABD-PELV W/ CM
2 of 5 series · 12 of 46 positions shown, 14 images · IV contrast (omnipaque)
Comparison: No priors.

CLINICAL DATA: 63-year-old male with history of cancer of unknown
primary.

EXAM:
CT CHEST, ABDOMEN, AND PELVIS WITH CONTRAST
TECHNIQUE: Multidetector CT imaging of the chest, abdomen and pelvis was
performed following the standard protocol during bolus
administration of intravenous contrast.
CONTRAST:  100mL OMNIPAQUE IOHEXOL 300 MG/ML  SOLN

[Series 3: cap with · axial · 0.75mm/px · z∈[-436,+134]mm · 9 of 138 slices shown, 11 images]
[im 12/138  soft-tissue]
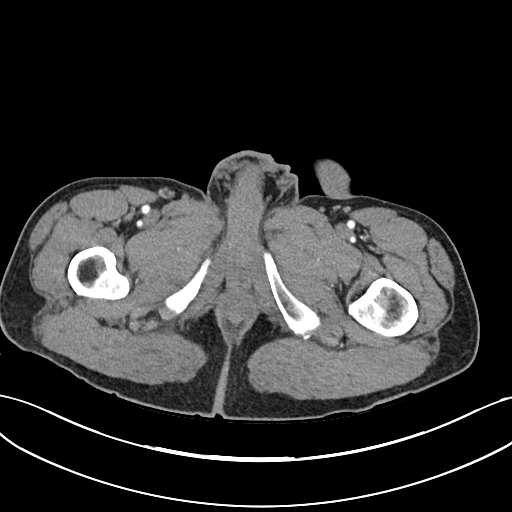
[im 12/138  bone]
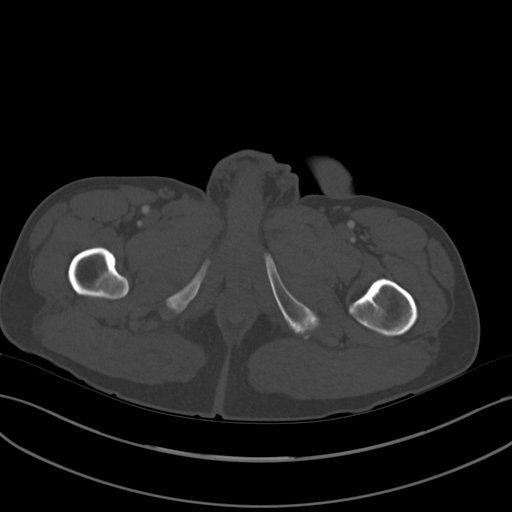
[im 23/138  soft-tissue]
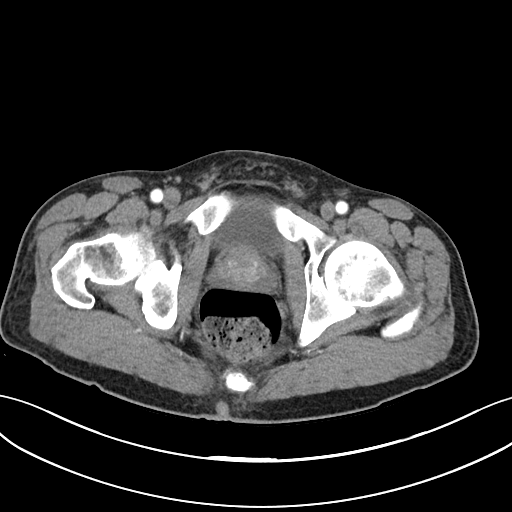
[im 46/138  soft-tissue]
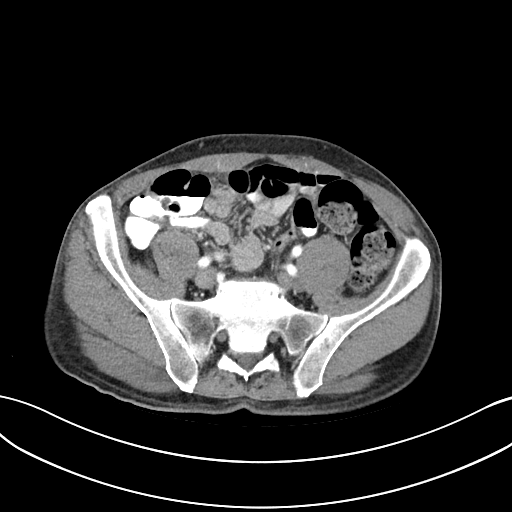
[im 58/138  soft-tissue]
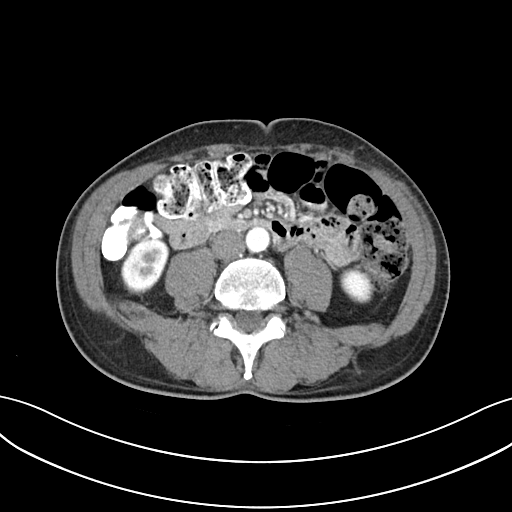
[im 69/138  soft-tissue]
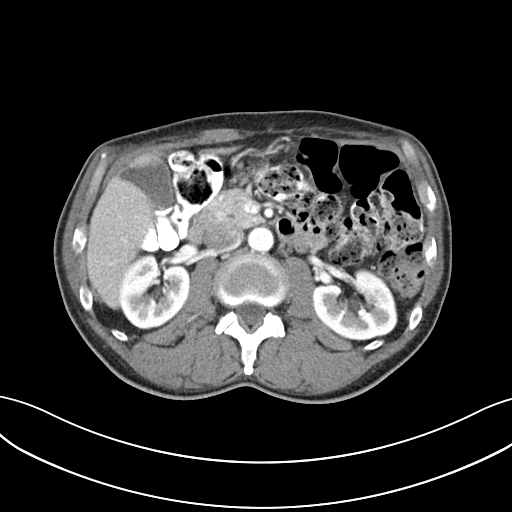
[im 80/138  soft-tissue]
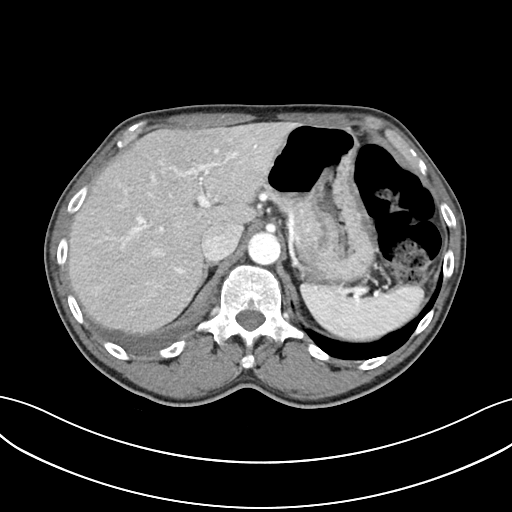
[im 92/138  soft-tissue]
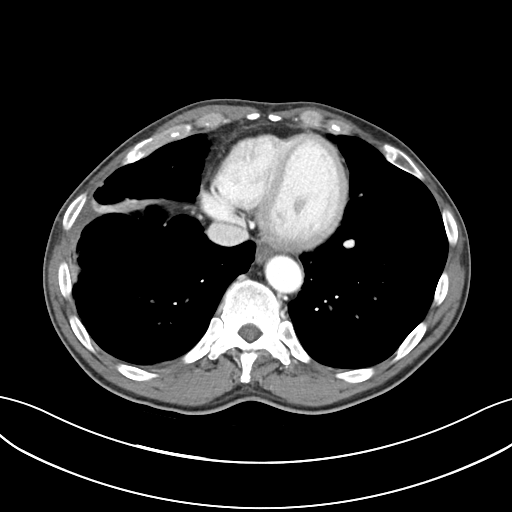
[im 115/138  soft-tissue]
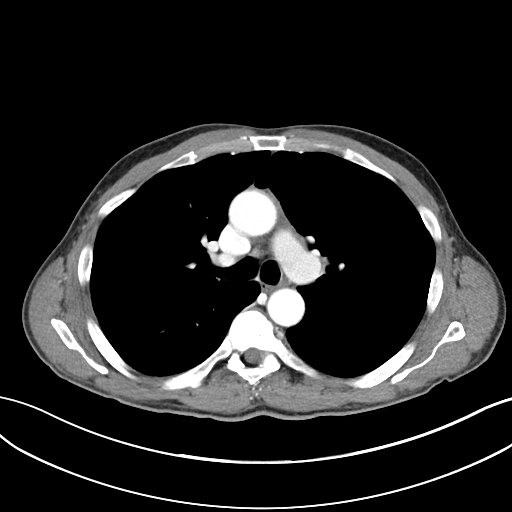
[im 126/138  soft-tissue]
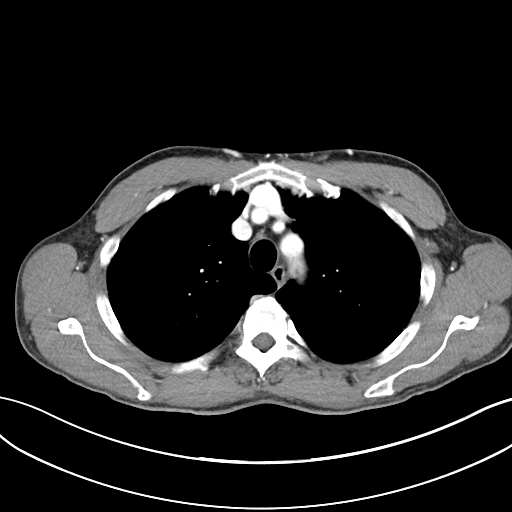
[im 126/138  bone]
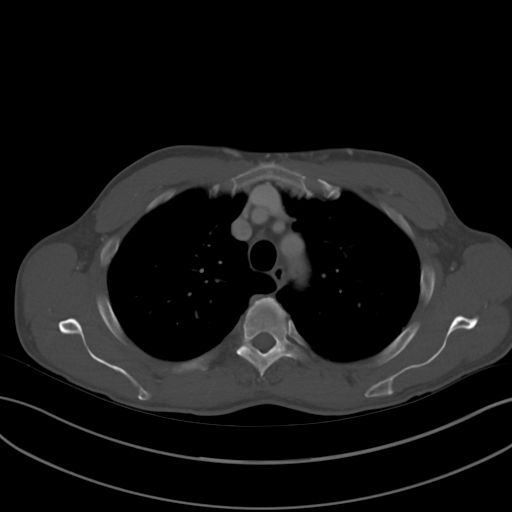

[Series 6: cor · coronal · 0.78mm/px · 3 of 90 slices shown]
[im 30/90  soft-tissue]
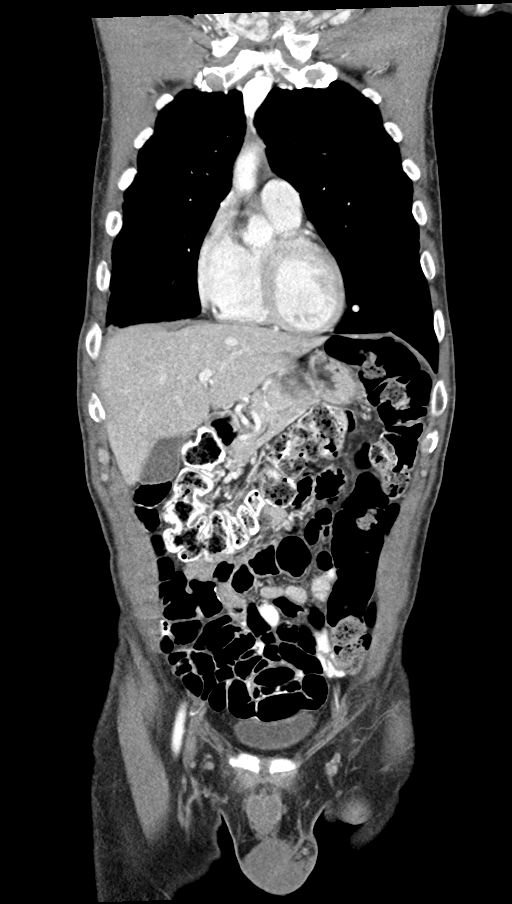
[im 40/90  soft-tissue]
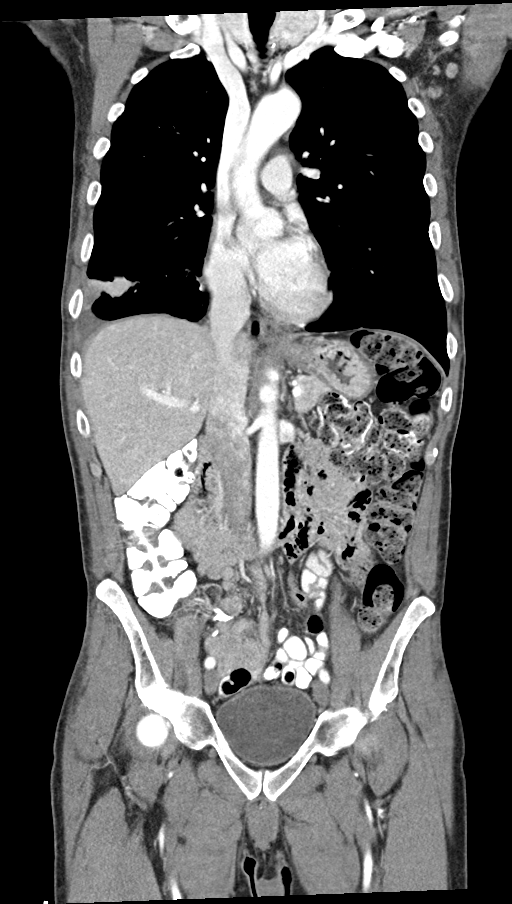
[im 50/90  soft-tissue]
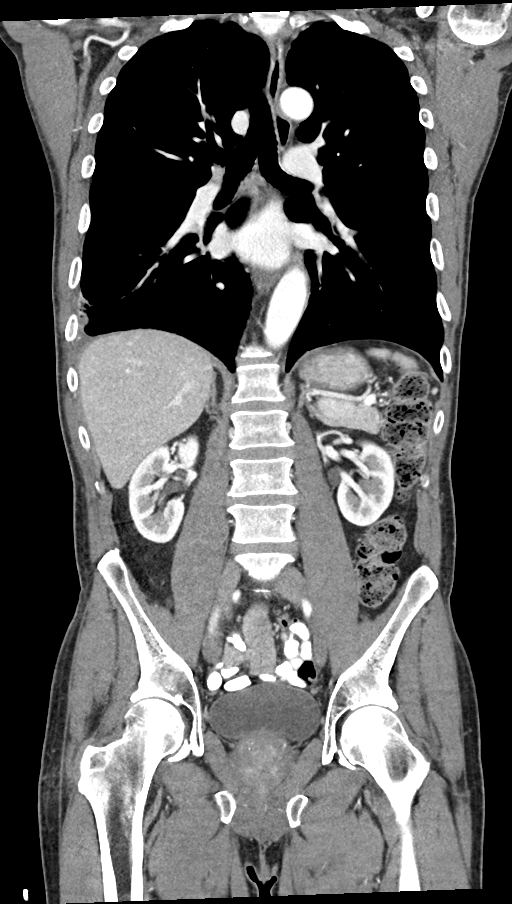

[12 of 46 positions shown; findings below may reference images not displayed]

FINDINGS: CT CHEST FINDINGS

Cardiovascular: Heart size is normal. There is no significant
pericardial fluid, thickening or pericardial calcification. No
atherosclerotic calcifications in the thoracic aorta or the coronary
arteries. Filling defects are present within segmental and
subsegmental sized pulmonary artery branches in the lungs
bilaterally, indicative of pulmonary embolism. The largest of these
is in the right lower lobe (axial image 39 of series 3). At this
time, these appear to be nonocclusive.

Mediastinum/Nodes: Large heterogeneously enhancing thyroid mass
incompletely imaged at the level of the thoracic inlet measuring at
least 11.8 x 9.8 cm (axial image 1 of series 3). No pathologically
enlarged mediastinal or hilar lymph nodes. Esophagus is unremarkable
in appearance. No axillary lymphadenopathy.

Lungs/Pleura: Multiple small pulmonary nodules are scattered
throughout the lungs bilaterally, highly concerning for widespread
metastatic disease, largest of which measure up to 9 mm in the left
lower lobe (axial image 122 of series 5, and 11 mm in the right
lower lobe (axial image 121 of series 5). Small right pleural
effusion which is partially loculated laterally, but predominantly
layers dependently. No left pleural effusion. No acute consolidative
airspace disease.

Musculoskeletal: Lytic lesions are noted in the thoracic spine
involving both T7 and T10. The largest lesion involves the posterior
aspect of the T10 vertebral body, extending through the pedicle into
the lateral and posterior elements, as well as involving the
adjacent head and neck of the left tenth rib, measuring 5.1 x 3.8 cm
(axial image 102 of series 5), encroaching upon and significantly
narrowing the central spinal canal. The smaller lesion is at T7
measuring approximately 3.9 x 2.7 cm also involving the left side of
the vertebral body extending into the pedicle and left transverse
process, as well as the left paraspinal soft tissues with minimal
encroachment upon the central spinal canal. Both of these lesions
demonstrate extensive enhancement.

CT ABDOMEN PELVIS FINDINGS

Hepatobiliary: No suspicious cystic or solid hepatic lesions. No
intra or extrahepatic biliary ductal dilatation. Gallbladder is
normal in appearance.

Pancreas: No pancreatic mass. No pancreatic ductal dilatation. No
pancreatic or peripancreatic fluid collections or inflammatory
changes.

Spleen: Unremarkable.

Adrenals/Urinary Tract: Subcentimeter low-attenuation lesion in the
interpolar region of the right kidney, too small to characterize,
but statistically likely to represent a tiny cyst. Left kidney and
bilateral adrenal glands are normal in appearance. No
hydroureteronephrosis. Urinary bladder is normal in appearance.

Stomach/Bowel: Normal appearance of the stomach. No pathologic
dilatation of small bowel or colon. Normal appendix.

Vascular/Lymphatic: Atherosclerotic calcifications in the pelvic
vasculature. No aneurysm or dissection noted in the abdominal or
pelvic vasculature. No lymphadenopathy noted in the abdomen or
pelvis.

Reproductive: Prostate gland and seminal vesicles are unremarkable.

Other: No significant volume of ascites.  No pneumoperitoneum.

Musculoskeletal: There are no aggressive appearing lytic or blastic
lesions noted in the visualized portions of the skeleton.
IMPRESSION: 1. Widespread metastatic disease to the lungs, small right pleural
effusion which is partially loculated and may be malignant, and
metastatic lesions in the thoracic spine, as detailed above. A
primary source is uncertain, however, the lesions of the thoracic
spine demonstrate avid enhancement, which is very similar to the
enhancement characteristics of the large thyroid mass.
2. Multiple segmental and subsegmental sized filling defects in the
pulmonary arteries bilaterally, compatible with pulmonary embolism.
3. Large heterogeneously enhancing thyroid mass incompletely imaged.
Although this may simply represent a thyroid goiter, the possibility
of primary thyroid neoplasm should be considered, particularly in
light of the similar enhancement characteristics of the spinal
lesions (discussed above).
4. Additional incidental findings, as above.

## 2020-08-30 ENCOUNTER — Other Ambulatory Visit (HOSPITAL_BASED_OUTPATIENT_CLINIC_OR_DEPARTMENT_OTHER): Payer: Self-pay

## 2020-08-30 MED FILL — Levothyroxine Sodium Tab 150 MCG: ORAL | 30 days supply | Qty: 30 | Fill #2 | Status: AC

## 2020-09-29 ENCOUNTER — Other Ambulatory Visit (HOSPITAL_BASED_OUTPATIENT_CLINIC_OR_DEPARTMENT_OTHER): Payer: Self-pay

## 2020-09-29 MED FILL — Levothyroxine Sodium Tab 150 MCG: ORAL | 30 days supply | Qty: 30 | Fill #3 | Status: AC

## 2020-11-01 ENCOUNTER — Other Ambulatory Visit (HOSPITAL_BASED_OUTPATIENT_CLINIC_OR_DEPARTMENT_OTHER): Payer: Self-pay

## 2020-11-01 MED FILL — Levothyroxine Sodium Tab 150 MCG: ORAL | 30 days supply | Qty: 30 | Fill #4 | Status: AC

## 2020-11-19 DIAGNOSIS — C73 Malignant neoplasm of thyroid gland: Secondary | ICD-10-CM | POA: Diagnosis not present

## 2020-11-19 DIAGNOSIS — E039 Hypothyroidism, unspecified: Secondary | ICD-10-CM | POA: Diagnosis not present

## 2020-11-29 ENCOUNTER — Other Ambulatory Visit (HOSPITAL_BASED_OUTPATIENT_CLINIC_OR_DEPARTMENT_OTHER): Payer: Self-pay

## 2020-11-29 ENCOUNTER — Telehealth: Payer: Self-pay | Admitting: Family

## 2020-11-29 ENCOUNTER — Other Ambulatory Visit: Payer: Self-pay

## 2020-11-29 ENCOUNTER — Ambulatory Visit (INDEPENDENT_AMBULATORY_CARE_PROVIDER_SITE_OTHER): Payer: Medicare Other

## 2020-11-29 VITALS — Ht 73.0 in | Wt 168.0 lb

## 2020-11-29 DIAGNOSIS — Z1211 Encounter for screening for malignant neoplasm of colon: Secondary | ICD-10-CM | POA: Diagnosis not present

## 2020-11-29 DIAGNOSIS — Z0000 Encounter for general adult medical examination without abnormal findings: Secondary | ICD-10-CM | POA: Diagnosis not present

## 2020-11-29 NOTE — Patient Instructions (Signed)
Tyrone Washington , Thank you for taking time to complete your Medicare Wellness Visit. I appreciate your ongoing commitment to your health goals. Please review the following plan we discussed and let me know if I can assist you in the future.   Screening recommendations/referrals: Colonoscopy: Referral ordered today. Someone will call you to schedule. Recommended yearly ophthalmology/optometry visit for glaucoma screening and checkup Recommended yearly dental visit for hygiene and checkup  Vaccinations: Influenza vaccine: Declined Pneumococcal vaccine: Due-Discuss with PCP at next office visit. Tdap vaccine: Up to date-Due-02/25/2029 Shingles vaccine: Discuss with pharmacy   Covid-19: Declined  Advanced directives: Information mailed today  Conditions/risks identified: See problem list  Next appointment: Follow up in one year for your annual wellness visit. 12/01/2021 @ 8:20.  Preventive Care 67 Years and Older, Male Preventive care refers to lifestyle choices and visits with your health care provider that can promote health and wellness. What does preventive care include? A yearly physical exam. This is also called an annual well check. Dental exams once or twice a year. Routine eye exams. Ask your health care provider how often you should have your eyes checked. Personal lifestyle choices, including: Daily care of your teeth and gums. Regular physical activity. Eating a healthy diet. Avoiding tobacco and drug use. Limiting alcohol use. Practicing safe sex. Taking low doses of aspirin every day. Taking vitamin and mineral supplements as recommended by your health care provider. What happens during an annual well check? The services and screenings done by your health care provider during your annual well check will depend on your age, overall health, lifestyle risk factors, and family history of disease. Counseling  Your health care provider may ask you questions about your: Alcohol  use. Tobacco use. Drug use. Emotional well-being. Home and relationship well-being. Sexual activity. Eating habits. History of falls. Memory and ability to understand (cognition). Work and work Statistician. Screening  You may have the following tests or measurements: Height, weight, and BMI. Blood pressure. Lipid and cholesterol levels. These may be checked every 5 years, or more frequently if you are over 42 years old. Skin check. Lung cancer screening. You may have this screening every year starting at age 48 if you have a 30-pack-year history of smoking and currently smoke or have quit within the past 15 years. Fecal occult blood test (FOBT) of the stool. You may have this test every year starting at age 16. Flexible sigmoidoscopy or colonoscopy. You may have a sigmoidoscopy every 5 years or a colonoscopy every 10 years starting at age 29. Prostate cancer screening. Recommendations will vary depending on your family history and other risks. Hepatitis C blood test. Hepatitis B blood test. Sexually transmitted disease (STD) testing. Diabetes screening. This is done by checking your blood sugar (glucose) after you have not eaten for a while (fasting). You may have this done every 1-3 years. Abdominal aortic aneurysm (AAA) screening. You may need this if you are a current or former smoker. Osteoporosis. You may be screened starting at age 12 if you are at high risk. Talk with your health care provider about your test results, treatment options, and if necessary, the need for more tests. Vaccines  Your health care provider may recommend certain vaccines, such as: Influenza vaccine. This is recommended every year. Tetanus, diphtheria, and acellular pertussis (Tdap, Td) vaccine. You may need a Td booster every 10 years. Zoster vaccine. You may need this after age 58. Pneumococcal 13-valent conjugate (PCV13) vaccine. One dose is recommended after age 14.  Pneumococcal polysaccharide  (PPSV23) vaccine. One dose is recommended after age 17. Talk to your health care provider about which screenings and vaccines you need and how often you need them. This information is not intended to replace advice given to you by your health care provider. Make sure you discuss any questions you have with your health care provider. Document Released: 03/26/2015 Document Revised: 11/17/2015 Document Reviewed: 12/29/2014 Elsevier Interactive Patient Education  2017 [place] Prevention in the Home Falls can cause injuries. They can happen to people of all ages. There are many things you can do to make your home safe and to help prevent falls. What can I do on the outside of my home? Regularly fix the edges of walkways and driveways and fix any cracks. Remove anything that might make you trip as you walk through a door, such as a raised step or threshold. Trim any bushes or trees on the path to your home. Use bright outdoor lighting. Clear any walking paths of anything that might make someone trip, such as rocks or tools. Regularly check to see if handrails are loose or broken. Make sure that both sides of any steps have handrails. Any raised decks and porches should have guardrails on the edges. Have any leaves, snow, or ice cleared regularly. Use sand or salt on walking paths during winter. Clean up any spills in your garage right away. This includes oil or grease spills. What can I do in the bathroom? Use night lights. Install grab bars by the toilet and in the tub and shower. Do not use towel bars as grab bars. Use non-skid mats or decals in the tub or shower. If you need to sit down in the shower, use a plastic, non-slip stool. Keep the floor dry. Clean up any water that spills on the floor as soon as it happens. Remove soap buildup in the tub or shower regularly. Attach bath mats securely with double-sided non-slip rug tape. Do not have throw rugs and other things on the  floor that can make you trip. What can I do in the bedroom? Use night lights. Make sure that you have a light by your bed that is easy to reach. Do not use any sheets or blankets that are too big for your bed. They should not hang down onto the floor. Have a firm chair that has side arms. You can use this for support while you get dressed. Do not have throw rugs and other things on the floor that can make you trip. What can I do in the kitchen? Clean up any spills right away. Avoid walking on wet floors. Keep items that you use a lot in easy-to-reach places. If you need to reach something above you, use a strong step stool that has a grab bar. Keep electrical cords out of the way. Do not use floor polish or wax that makes floors slippery. If you must use wax, use non-skid floor wax. Do not have throw rugs and other things on the floor that can make you trip. What can I do with my stairs? Do not leave any items on the stairs. Make sure that there are handrails on both sides of the stairs and use them. Fix handrails that are broken or loose. Make sure that handrails are as long as the stairways. Check any carpeting to make sure that it is firmly attached to the stairs. Fix any carpet that is loose or worn. Avoid having throw rugs at the  top or bottom of the stairs. If you do have throw rugs, attach them to the floor with carpet tape. Make sure that you have a light switch at the top of the stairs and the bottom of the stairs. If you do not have them, ask someone to add them for you. What else can I do to help prevent falls? Wear shoes that: Do not have high heels. Have rubber bottoms. Are comfortable and fit you well. Are closed at the toe. Do not wear sandals. If you use a stepladder: Make sure that it is fully opened. Do not climb a closed stepladder. Make sure that both sides of the stepladder are locked into place. Ask someone to hold it for you, if possible. Clearly mark and make  sure that you can see: Any grab bars or handrails. First and last steps. Where the edge of each step is. Use tools that help you move around (mobility aids) if they are needed. These include: Canes. Walkers. Scooters. Crutches. Turn on the lights when you go into a dark area. Replace any light bulbs as soon as they burn out. Set up your furniture so you have a clear path. Avoid moving your furniture around. If any of your floors are uneven, fix them. If there are any pets around you, be aware of where they are. Review your medicines with your doctor. Some medicines can make you feel dizzy. This can increase your chance of falling. Ask your doctor what other things that you can do to help prevent falls. This information is not intended to replace advice given to you by your health care provider. Make sure you discuss any questions you have with your health care provider. Document Released: 12/24/2008 Document Revised: 08/05/2015 Document Reviewed: 04/03/2014 Elsevier Interactive Patient Education  2017 [employer].

## 2020-11-29 NOTE — Telephone Encounter (Signed)
Patient is due for follow up. Please contact him to schedule.

## 2020-11-29 NOTE — Progress Notes (Signed)
Subjective:   Tyrone Washington is a 65 y.o. male who presents for an Initial Medicare Annual Wellness Visit.  I connected with Tyrone Washington today by telephone and verified that I am speaking with the correct person using two identifiers. Location patient: home Location provider: work Persons participating in the virtual visit: patient, Marine scientist.    I discussed the limitations, risks, security and privacy concerns of performing an evaluation and management service by telephone and the availability of in person appointments. I also discussed with the patient that there may be a patient responsible charge related to this service. The patient expressed understanding and verbally consented to this telephonic visit.    Interactive audio and video telecommunications were attempted between this provider and patient, however failed, due to patient having technical difficulties OR patient did not have access to video capability.  We continued and completed visit with audio only.  Some vital signs may be absent or patient reported.   Time Spent with patient on telephone encounter: 20 minutes   Review of Systems     Cardiac Risk Factors include: advanced age (>54mn, >>74women);male gender;dyslipidemia     Objective:    Today's Vitals   11/29/20 0740 11/29/20 0741  Weight: 168 lb (76.2 kg)   Height: '6\' 1"'$  (1.854 m)   PainSc:  5    Body mass index is 22.16 kg/m.  Advanced Directives 11/29/2020 05/13/2019 05/12/2019 05/12/2019  Does Patient Have a Medical Advance Directive? No No No No  Would patient like information on creating a medical advance directive? Yes (MAU/Ambulatory/Procedural Areas - Information given) No - Patient declined No - Patient declined -    Current Medications (verified) Outpatient Encounter Medications as of 11/29/2020  Medication Sig   levothyroxine (SYNTHROID) 150 MCG tablet TAKE 1 TABLET (150 MCG TOTAL) BY MOUTH DAILY AT 0600.   ondansetron (ZOFRAN-ODT) 8 MG disintegrating  tablet DISSOLVE 1 TABLET (8 MG TOTAL) BY MOUTH EVERY 8 (EIGHT) HOURS AS NEEDED FOR UP TO 7 DAYS FOR NAUSEA.   valACYclovir (VALTREX) 500 MG tablet Take 1 tablet (500 mg total) by mouth daily. (Patient taking differently: Take 500 mg by mouth daily as needed (outbreaks).)   vitamin B-12 (CYANOCOBALAMIN) 1000 MCG tablet Take 1 tablet (1,000 mcg total) by mouth daily.   [DISCONTINUED] levothyroxine (SYNTHROID) 150 MCG tablet Take 150 mcg by mouth every morning.   [DISCONTINUED] levothyroxine (SYNTHROID) 150 MCG tablet TAKE 1 TABLET (150 MCG TOTAL) BY MOUTH DAILY AT 0600.   [DISCONTINUED] levothyroxine (SYNTHROID) 150 MCG tablet TAKE 1 TABLET (150 MCG TOTAL) BY MOUTH DAILY AT 0600.   No facility-administered encounter medications on file as of 11/29/2020.    Allergies (verified) Patient has no known allergies.   History: Past Medical History:  Diagnosis Date   Allergy    allergic rhinitis   Arthritis    B12 deficiency 04/10/2019   History of chicken pox    Hyperlipidemia    no meds now   Past Surgical History:  Procedure Laterality Date   COLONOSCOPY     LAMINECTOMY N/A 05/13/2019   Procedure: THORACIC TEN LAMINECTOMY FOR TUMOR;  Surgeon: PEarnie Larsson MD;  [facility]  Service: Neurosurgery;  Laterality: N/A;   THYROID SURGERY     "Years ago"   Family History  Problem Relation Age of Onset   Heart disease Father        patient is unsure of history   CVA Father    Heart attack Neg Hx    Colon  cancer Neg Hx    Esophageal cancer Neg Hx    Rectal cancer Neg Hx    Stomach cancer Neg Hx    Social History   Socioeconomic History   Marital status: Single    Spouse name: Not on file   Number of children: 2   Years of education: Not on file   Highest education level: Not on file  Occupational History    Employer: [employer]  Tobacco Use   Smoking status: Every Day   Smokeless tobacco: Never   Tobacco comments:    Smokes 1 - 2 cigars daily  Vaping Use   Vaping Use: Never used   Substance and Sexual Activity   Alcohol use: Yes    Alcohol/week: 7.0 standard drinks    Types: 7 Cans of beer per week   Drug use: Never   Sexual activity: Not on file  Other Topics Concern   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     Denies hobbies   No pets.    Social Determinants of Health   Financial Resource Strain: Low Risk    Difficulty of Paying Living Expenses: Not hard at all  Food Insecurity: No Food Insecurity   Worried About Charity fundraiser in the Last Year: Never true   [place] in the Last Year: Never true  Transportation Needs: No Transportation Needs   Lack of Transportation (Medical): No   Lack of Transportation (Non-Medical): No  Physical Activity: Inactive   Days of Exercise per Week: 0 days   Minutes of Exercise per Session: 0 min  Stress: No Stress Concern Present   Feeling of Stress : Not at all  Social Connections: Socially Isolated   Frequency of Communication with Friends and Family: Three times a week   Frequency of Social Gatherings with Friends and Family: Three times a week   Attends Religious Services: Never   Active Member of Clubs or Organizations: No   Attends Archivist Meetings: Never   Marital Status: Never married    Tobacco Counseling Ready to quit: Not Answered Counseling given: Not Answered Tobacco comments: Smokes 1 - 2 cigars daily   Clinical Intake:  Pre-visit preparation completed: Yes  Pain : 0-10 Pain Score: 5  Pain Type: Chronic pain Pain Location: Back Pain Onset: More than a month ago Pain Frequency: Intermittent     Nutritional Status: BMI of 19-24  Normal Nutritional Risks: None Diabetes: No  How often do you need to have someone  help you when you read instructions, pamphlets, or other written materials from your doctor or pharmacy?: 1 - Never  Diabetic?No  Interpreter Needed?: No  Information entered by :: Caroleen Hamman LPN   Activities of Daily Living In your present state of health, do you have any difficulty performing the following activities: 11/29/2020  Hearing? N  Vision? N  Difficulty concentrating or making decisions? N  Walking or climbing stairs? N  Dressing or bathing? N  Doing errands, shopping? N  Preparing Food and eating ? N  Using the Toilet? N  In the past six months, have you accidently leaked urine? N  Do you have problems with  loss of bowel control? N  Managing your Medications? N  Managing your Finances? N  Housekeeping or managing your Housekeeping? N  Some recent data might be hidden    Patient Care Team: [name], NP as PCP - General (Internal Medicine) Alda Berthold, DO as Consulting Physician (Neurology)  Indicate any recent Medical Services you may have received from other than [facility] providers in the past year (date may be approximate).     Assessment:   This is a routine wellness examination for Deshaun.  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 0 1 1  Number falls in past yr: 0 1 1  Injury with Fall? 0 1 1  Follow up Falls prevention discussed - -     FALL RISK PREVENTION PERTAINING TO THE HOME:  Any stairs in or around the home? No Home free of loose throw rugs in walkways, pet beds, electrical cords, etc? Yes  Adequate lighting in your home to reduce risk of falls? Yes   ASSISTIVE DEVICES UTILIZED TO PREVENT FALLS:  Life alert? No  Use of a cane, walker or w/c? No  Grab bars in the bathroom? No  Shower chair or bench in shower? No  Elevated toilet seat or a handicapped toilet? No   TIMED UP AND GO:  Was the test performed? No . Phone visit   Cognitive Function:  Normal cognitive status assessed by this Nurse Health Advisor. No abnormalities found.        Immunizations Immunization History  Administered Date(s) Administered   Influenza Whole 01/11/2009   Pneumococcal Polysaccharide-23 02/26/2019   Td 03/17/2009, 02/26/2019    TDAP status: Up to date  Flu Vaccine status: Declined, Education has been provided regarding the importance of this vaccine but patient still declined. Advised may receive this vaccine at local pharmacy or Health Dept. Aware to provide a copy of the vaccination record if obtained from local pharmacy or Health Dept. Verbalized acceptance and understanding.  Pneumococcal vaccine status: Due, Education has been provided regarding the importance of this vaccine. Advised may receive this vaccine at local pharmacy or Health Dept. Aware to provide a copy of the vaccination record if obtained from local pharmacy or Health Dept. Verbalized acceptance and understanding.  Covid-19 vaccine status: Declined, Education has been provided regarding the importance of this vaccine but patient still declined. Advised may receive this vaccine at local pharmacy or Health Dept.or vaccine clinic. Aware to provide a copy of the vaccination record if obtained from local pharmacy or Health Dept. Verbalized acceptance and understanding.  Qualifies for Shingles Vaccine? Yes   Zostavax completed No   Shingrix Completed?:  No.    Education has been provided regarding the importance of this vaccine. Patient has been advised to call insurance company to determine out of  pocket expense if they have not yet received this vaccine. Advised may also receive vaccine at local pharmacy or Health Dept. Verbalized acceptance and understanding.  Screening Tests Health Maintenance  Topic Date Due   Zoster Vaccines- Shingrix (1 of 2) Never done   COLONOSCOPY (Pts 45-71yr Insurance coverage will need to be confirmed)  09/03/2017   INFLUENZA VACCINE  10/11/2020   COVID-19 Vaccine (1) 02/10/2021 (Originally 06/23/1956)   TETANUS/TDAP  02/25/2029   Hepatitis C Screening  Completed   HPV VACCINES  Aged Out    Health Maintenance  Health Maintenance Due  Topic Date Due   Zoster Vaccines- Shingrix (1 of 2) Never done   COLONOSCOPY (Pts 45-475yrInsurance coverage will need to be confirmed)  09/03/2017   INFLUENZA VACCINE  10/11/2020    Colorectal cancer screening: Referral to GI placed today. Pt aware the office will call re: appt.  Lung Cancer Screening: (Low Dose CT Chest recommended if Age 67-80ears, 30 pack-year currently smoking OR have quit w/in 15years.) does not qualify.    Additional Screening:  Hepatitis C Screening: Completed 07/16/2015  Vision Screening: Recommended annual ophthalmology exams for early detection of glaucoma and other disorders of the eye. Is the patient up to date with their annual eye exam?  No  Who is the provider or what is the name of the office in which the patient attends annual eye exams? Pt unsure-has an upcoming appt   Dental Screening: Recommended annual dental exams for proper oral hygiene  Community Resource Referral / Chronic Care Management: CRR required this visit?  No   CCM required this visit?  No      Plan:     I have personally reviewed and noted the following in the patient's chart:   Medical and social history Use of alcohol, tobacco or illicit drugs   Current medications and supplements including opioid prescriptions. Patient is not currently taking opioid prescriptions. Functional ability and status Nutritional status Physical activity Advanced directives List of other physicians Hospitalizations, surgeries, and ER visits in previous 12 months Vitals Screenings to include cognitive, depression, and falls Referrals and appointments  In addition, I have reviewed and discussed with patient certain preventive protocols, quality metrics, and best practice recommendations. A written personalized care plan for preventive services as well as general preventive health recommendations were provided to patient.    Due to this being a telephonic visit, the after visit summary with patients personalized plan was offered to patient via mail or my-chart. Patient would like to access on my-chart.  MaMarta AntuLPN   [id number] Health Advisor  Nurse Notes: None

## 2020-11-30 ENCOUNTER — Other Ambulatory Visit (HOSPITAL_BASED_OUTPATIENT_CLINIC_OR_DEPARTMENT_OTHER): Payer: Self-pay

## 2020-11-30 NOTE — Telephone Encounter (Signed)
Letter sent to Pt informing him that he is due for appt.

## 2020-12-02 ENCOUNTER — Other Ambulatory Visit (HOSPITAL_BASED_OUTPATIENT_CLINIC_OR_DEPARTMENT_OTHER): Payer: Self-pay

## 2020-12-02 MED ORDER — LEVOTHYROXINE SODIUM 150 MCG PO TABS
ORAL_TABLET | ORAL | 3 refills | Status: DC
  Filled 2020-12-02: qty 30, 30d supply, fill #0
  Filled 2020-12-27: qty 30, 30d supply, fill #1
  Filled 2021-01-18: qty 30, 30d supply, fill #2
  Filled 2021-02-23 (×2): qty 30, 30d supply, fill #3
  Filled 2021-03-25: qty 30, 30d supply, fill #4

## 2020-12-03 ENCOUNTER — Other Ambulatory Visit: Payer: Self-pay

## 2020-12-03 ENCOUNTER — Ambulatory Visit (INDEPENDENT_AMBULATORY_CARE_PROVIDER_SITE_OTHER): Payer: Medicare Other | Admitting: Family

## 2020-12-03 ENCOUNTER — Other Ambulatory Visit (HOSPITAL_BASED_OUTPATIENT_CLINIC_OR_DEPARTMENT_OTHER): Payer: Self-pay

## 2020-12-03 VITALS — BP 104/73 | HR 76 | Temp 98.6°F | Resp 16 | Wt 166.4 lb

## 2020-12-03 DIAGNOSIS — C799 Secondary malignant neoplasm of unspecified site: Secondary | ICD-10-CM

## 2020-12-03 DIAGNOSIS — C73 Malignant neoplasm of thyroid gland: Secondary | ICD-10-CM | POA: Diagnosis not present

## 2020-12-03 DIAGNOSIS — Z125 Encounter for screening for malignant neoplasm of prostate: Secondary | ICD-10-CM | POA: Diagnosis not present

## 2020-12-03 DIAGNOSIS — E785 Hyperlipidemia, unspecified: Secondary | ICD-10-CM | POA: Diagnosis not present

## 2020-12-03 DIAGNOSIS — E538 Deficiency of other specified B group vitamins: Secondary | ICD-10-CM

## 2020-12-03 LAB — COMPREHENSIVE METABOLIC PANEL
ALT: 8 U/L (ref 0–53)
AST: 14 U/L (ref 0–37)
Albumin: 4.1 g/dL (ref 3.5–5.2)
Alkaline Phosphatase: 56 U/L (ref 39–117)
BUN: 15 mg/dL (ref 6–23)
CO2: 28 mEq/L (ref 19–32)
Calcium: 9.3 mg/dL (ref 8.4–10.5)
Chloride: 104 mEq/L (ref 96–112)
Creatinine, Ser: 1.01 mg/dL (ref 0.40–1.50)
GFR: 77.26 mL/min (ref >60.00)
Glucose, Bld: 84 mg/dL (ref 70–99)
Potassium: 4.5 mEq/L (ref 3.5–5.1)
Sodium: 140 mEq/L (ref 135–145)
Total Bilirubin: 0.5 mg/dL (ref 0.2–1.2)
Total Protein: 6.6 g/dL (ref 6.0–8.3)

## 2020-12-03 LAB — LIPID PANEL
Cholesterol: 222 mg/dL — ABNORMAL HIGH (ref 0–200)
HDL: 45.5 mg/dL (ref >39.00)
LDL Cholesterol: 161 mg/dL — ABNORMAL HIGH (ref 0–99)
NonHDL: 176.01
Total CHOL/HDL Ratio: 5
Triglycerides: 76 mg/dL (ref 0.0–149.0)
VLDL: 15.2 mg/dL (ref 0.0–40.0)

## 2020-12-03 LAB — PSA, MEDICARE: PSA: 0.8 ng/ml (ref 0.10–4.00)

## 2020-12-03 LAB — VITAMIN B12: Vitamin B-12: 403 pg/mL (ref 211–911)
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
## 2020-12-03 NOTE — Assessment & Plan Note (Signed)
He is maintained on synthroid which is being monitored by endocrinology and continues to follow with oncology for surveillance.

## 2020-12-03 NOTE — Patient Instructions (Signed)
Please complete lab work prior to leaving.   

## 2020-12-17 DIAGNOSIS — M8448XA Pathological fracture, other site, initial encounter for fracture: Secondary | ICD-10-CM | POA: Diagnosis not present

## 2020-12-17 DIAGNOSIS — C7951 Secondary malignant neoplasm of bone: Secondary | ICD-10-CM | POA: Diagnosis not present

## 2020-12-17 DIAGNOSIS — R918 Other nonspecific abnormal finding of lung field: Secondary | ICD-10-CM | POA: Diagnosis not present

## 2020-12-17 DIAGNOSIS — C73 Malignant neoplasm of thyroid gland: Secondary | ICD-10-CM | POA: Diagnosis not present

## 2020-12-27 ENCOUNTER — Other Ambulatory Visit (HOSPITAL_BASED_OUTPATIENT_CLINIC_OR_DEPARTMENT_OTHER): Payer: Self-pay

## 2021-01-17 ENCOUNTER — Other Ambulatory Visit (HOSPITAL_BASED_OUTPATIENT_CLINIC_OR_DEPARTMENT_OTHER): Payer: Self-pay

## 2021-01-17 ENCOUNTER — Ambulatory Visit (AMBULATORY_SURGERY_CENTER): Payer: Medicare Other | Admitting: *Deleted

## 2021-01-17 ENCOUNTER — Other Ambulatory Visit: Payer: Self-pay

## 2021-01-17 ENCOUNTER — Encounter: Payer: Self-pay | Admitting: Gastroenterology

## 2021-01-17 VITALS — Ht 73.0 in | Wt 166.0 lb

## 2021-01-17 DIAGNOSIS — Z1211 Encounter for screening for malignant neoplasm of colon: Secondary | ICD-10-CM

## 2021-01-17 MED ORDER — PEG 3350-KCL-NA BICARB-NACL 420 G PO SOLR
4000.0000 mL | Freq: Once | ORAL | 0 refills | Status: AC
  Filled 2021-01-17: qty 4000, 1d supply, fill #0

## 2021-01-17 NOTE — Progress Notes (Signed)
Patient's pre-visit was done today over the phone with the patient. Name,DOB and address verified. Patient denies any allergies to Eggs and Soy. Patient denies any problems with anesthesia/sedation. Patient is not taking any diet pills or blood thinners. No home Oxygen. Packet of Prep instructions mailed to patient including a copy of a consent form-pt is aware. Patient understands to call us back with any questions or concerns. Patient is aware of our care-partner policy and Covid-19 safety protocol.   EMMI education assigned to the patient for the procedure, sent to MyChart.  

## 2021-01-18 ENCOUNTER — Other Ambulatory Visit (HOSPITAL_BASED_OUTPATIENT_CLINIC_OR_DEPARTMENT_OTHER): Payer: Self-pay

## 2021-01-20 DIAGNOSIS — C73 Malignant neoplasm of thyroid gland: Secondary | ICD-10-CM | POA: Diagnosis not present

## 2021-01-24 ENCOUNTER — Telehealth: Payer: Self-pay

## 2021-01-24 NOTE — Telephone Encounter (Signed)
Patient has a complicated medical hx to include metastatic follicular thyroid carcinoma, total thyroidectomy, central neck dissection, recent fracture of T12... I called and cancelled direct colonoscopy scheduled for Monday, 11-21. Patient has been scheduled for an OV on Tuesday, 11-22 at 4:00pm (Per Dr. Havery Moros who spoke with Osvaldo Angst)

## 2021-01-27 DIAGNOSIS — C7951 Secondary malignant neoplasm of bone: Secondary | ICD-10-CM | POA: Diagnosis not present

## 2021-01-27 DIAGNOSIS — S22089K Unspecified fracture of T11-T12 vertebra, subsequent encounter for fracture with nonunion: Secondary | ICD-10-CM | POA: Diagnosis not present

## 2021-01-27 DIAGNOSIS — F17290 Nicotine dependence, other tobacco product, uncomplicated: Secondary | ICD-10-CM | POA: Diagnosis not present

## 2021-01-27 DIAGNOSIS — Z923 Personal history of irradiation: Secondary | ICD-10-CM | POA: Diagnosis not present

## 2021-01-27 DIAGNOSIS — M549 Dorsalgia, unspecified: Secondary | ICD-10-CM | POA: Diagnosis not present

## 2021-01-27 DIAGNOSIS — C73 Malignant neoplasm of thyroid gland: Secondary | ICD-10-CM | POA: Diagnosis not present

## 2021-01-31 ENCOUNTER — Encounter: Payer: Medicare Other | Admitting: Gastroenterology

## 2021-02-01 ENCOUNTER — Ambulatory Visit: Payer: Medicare Other | Admitting: Gastroenterology

## 2021-02-17 ENCOUNTER — Telehealth: Payer: Self-pay | Admitting: Nurse Practitioner

## 2021-02-17 ENCOUNTER — Ambulatory Visit: Payer: Medicare Other | Admitting: Nurse Practitioner

## 2021-02-17 NOTE — Telephone Encounter (Signed)
Patient called and stated that he had an appointment at 8:30 and couldn't find our building.   Rescheduled for 1/4  at 9:30am

## 2021-02-22 DIAGNOSIS — C73 Malignant neoplasm of thyroid gland: Secondary | ICD-10-CM | POA: Diagnosis not present

## 2021-02-23 ENCOUNTER — Other Ambulatory Visit (HOSPITAL_BASED_OUTPATIENT_CLINIC_OR_DEPARTMENT_OTHER): Payer: Self-pay

## 2021-03-15 DIAGNOSIS — M8588 Other specified disorders of bone density and structure, other site: Secondary | ICD-10-CM | POA: Diagnosis not present

## 2021-03-15 DIAGNOSIS — C73 Malignant neoplasm of thyroid gland: Secondary | ICD-10-CM | POA: Diagnosis not present

## 2021-03-15 DIAGNOSIS — S22080D Wedge compression fracture of T11-T12 vertebra, subsequent encounter for fracture with routine healing: Secondary | ICD-10-CM | POA: Diagnosis not present

## 2021-03-15 DIAGNOSIS — C7951 Secondary malignant neoplasm of bone: Secondary | ICD-10-CM | POA: Diagnosis not present

## 2021-03-15 DIAGNOSIS — S22080K Wedge compression fracture of T11-T12 vertebra, subsequent encounter for fracture with nonunion: Secondary | ICD-10-CM | POA: Diagnosis not present

## 2021-03-16 ENCOUNTER — Encounter: Payer: Self-pay | Admitting: Nurse Practitioner

## 2021-03-16 ENCOUNTER — Ambulatory Visit: Payer: Medicare Other | Admitting: Nurse Practitioner

## 2021-03-16 VITALS — BP 114/62 | HR 76 | Ht 73.0 in | Wt 166.6 lb

## 2021-03-16 DIAGNOSIS — Z1211 Encounter for screening for malignant neoplasm of colon: Secondary | ICD-10-CM

## 2021-03-16 DIAGNOSIS — Z85850 Personal history of malignant neoplasm of thyroid: Secondary | ICD-10-CM

## 2021-03-16 NOTE — Progress Notes (Signed)
ASSESSMENT AND PLAN    # 66 yo male for colon cancer screening. No bowel changes, blood in stool or family history of colon cancer.  He is not anemia.  --Schedule for a screening colonoscopy. The risks and benefits of colonoscopy with possible polypectomy / biopsies were discussed and the patient agrees to proceed.  --Patient was initially scheduled for direct colonoscopy but this was canceled and changed to an office visit today.  Presumably this was done so that patient could get scheduled to have the procedure done at the hospital given his history of head / neck cancer. He has a history of metastatic follicular thyroid cancer status post total thyroidectomy and RAI.   HISTORY OF PRESENT ILLNESS     Chief Complaint : Discuss screening colonoscopy.   Tyrone Washington is a 66 y.o. 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  He has had a total thyroidectomy.  He had RAI in August 2021 . See PMH below for any additional history.    Patient was referred by PCP for colon cancer screening.  Initially he was scheduled for direct colonoscopy but this was changed to an office.  He is followed by Hematology with [place] for history of thyroid cancer.   Patient reports a normal colonoscopy ~ 2005 in [place], that was his last one. He has no [facility] of colon cancer. His bowel movements are normal. No blood in stool. His weight is stable.   Sometimes he has problems swallowing if he eats too fast. He feels like food gets stuck. Problem resolves when he slows down the pace.    Data Reviewed: November 2022 WBC 2.6, hemoglobin 13, platelets 225 CMP normal TSH less than 0.05  Past Medical History:  Diagnosis Date   Allergy    allergic rhinitis   Arthritis    B12 deficiency 04/10/2019   Cancer ([place]) 06/2019   thyroid cancer treatment   History of chicken pox    Hyperlipidemia    no meds now     Past Surgical History:   Procedure Laterality Date   COLONOSCOPY  2005   in [place]-normal exam   LAMINECTOMY N/A 05/13/2019   Procedure: THORACIC TEN LAMINECTOMY FOR TUMOR;  Surgeon: Earnie Larsson, MD;  Location: [facility];  Service: Neurosurgery;  Laterality: N/A;   THYROID SURGERY     "Years ago"   Family History  Problem Relation Age of Onset   Heart disease Father        patient is unsure of history   CVA Father    Heart attack Neg Hx    Colon cancer Neg Hx    Esophageal cancer Neg Hx    Rectal cancer Neg Hx    Stomach cancer Neg Hx    Social History   Tobacco Use   Smoking status: Every Day    Types: Cigars   Smokeless tobacco: Never   Tobacco comments:    Smokes 1 - 2 cigars daily  Vaping Use   Vaping Use: Never used  Substance Use Topics   Alcohol use: Yes    Alcohol/week: 2.0 standard drinks    Types: 2 Cans of beer per week   Drug use: Never   Current Outpatient Medications  Medication Sig Dispense Refill   levothyroxine (SYNTHROID) 150 MCG tablet Take 1 tablet (150 mcg total) by mouth Daily at 0600. 90 tablet 3   Multiple Vitamins-Minerals (CENTRUM SILVER 50+MEN) TABS Take 1 tablet  by mouth daily.     valACYclovir (VALTREX) 500 MG tablet Take 1 tablet (500 mg total) by mouth daily. (Patient taking differently: Take 500 mg by mouth daily as needed (outbreaks).) 30 tablet 5   vitamin B-12 (CYANOCOBALAMIN) 1000 MCG tablet Take 1 tablet (1,000 mcg total) by mouth daily.     No current facility-administered medications for this visit.   No Known Allergies   Review of Systems: All systems reviewed and negative except where noted in HPI.    PHYSICAL EXAM :    Wt Readings from Last 3 Encounters:  01/17/21 166 lb (75.3 kg)  12/03/20 166 lb 6.4 oz (75.5 kg)  11/29/20 168 lb (76.2 kg)    BP 114/62    Pulse 76    Ht 6\' 1"  (1.854 m)    Wt 166 lb 9.6 oz (75.6 kg)    BMI 21.98 kg/m  Constitutional:  Generally well appearing male in no acute distress. Psychiatric: Pleasant. Normal mood  and affect. Behavior is normal. EENT: Pupils normal.  Conjunctivae are normal. No scleral icterus. Neck supple. Healed surgical scar at base of neck Cardiovascular: Normal rate, regular rhythm. No edema Pulmonary/chest: Effort normal and breath sounds normal. No wheezing, rales or rhonchi. Abdominal: Soft, nondistended, nontender. Bowel sounds active throughout. There are no masses palpable. No hepatomegaly. Neurological: Alert and oriented to person place and time. Skin: Skin is warm and dry. No rashes noted.  Tye Savoy, NP  03/16/2021, 9:02 AM  Cc:  [name], NP

## 2021-03-16 NOTE — Patient Instructions (Signed)
If you are age 68 or older, your body mass index should be between 23-30. Your Body mass index is 21.98 kg/m. If this is out of the aforementioned range listed, please consider follow up with your Primary Care Provider. ________________________________________________________  The [facility] providers would like to encourage you to use [facility] to communicate with providers for non-urgent requests or questions.  Due to long hold times on the telephone, sending your provider a message by [facility] may be a faster and more efficient way to get a response.  Please allow 48 business hours for a response.  Please remember that this is for non-urgent requests.  _______________________________________________________  Tyrone Washington have been scheduled for an endoscopy and colonoscopy. Please follow the written instructions given to you at your visit today. Please pick up your prep supplies at the pharmacy within the next 1-3 days. If you use inhalers (even only as needed), please bring them with you on the day of your procedure.  Follow up pending.  Thank you for entrusting me with your care and choosing [facility].  Tye Savoy, NP-C

## 2021-03-21 NOTE — Progress Notes (Signed)
I reviewed this patient's chart and called him today to discuss this situation further. He has metastatic thyroid cancer to the bones and lungs, fairly widespread disease. He has had imaging studies showing no colonic abnormalities. It appears his disease has been stable on his most recent follow up visit with his oncologist but he does have active metastatic disease, has had XRT and surgery in the past for this. We discussed how aggressive he wanted to be with colon cancer screening given his other active malignancy. I discussed colonoscopy with him, what it entails, risks. He denies any issues with anesthesia in the past or his airway. Following our discussion I offered him a colonoscopy if he wanted to pursue this, but he declines at this time in light of his other malignancy which he is dealing with, which I think is reasonable. If he changes his mind or wants to pursue this in the future, pending his course, he will contact us. All questions answered, he thanked me for the call.   Nevin Bloodgood, as above, he does not wish to pursue colonoscopy screening at this time.

## 2021-03-24 DIAGNOSIS — R7989 Other specified abnormal findings of blood chemistry: Secondary | ICD-10-CM | POA: Diagnosis not present

## 2021-03-24 DIAGNOSIS — Z85118 Personal history of other malignant neoplasm of bronchus and lung: Secondary | ICD-10-CM | POA: Diagnosis not present

## 2021-03-24 DIAGNOSIS — F17290 Nicotine dependence, other tobacco product, uncomplicated: Secondary | ICD-10-CM | POA: Diagnosis not present

## 2021-03-24 DIAGNOSIS — Z9221 Personal history of antineoplastic chemotherapy: Secondary | ICD-10-CM | POA: Diagnosis not present

## 2021-03-24 DIAGNOSIS — D72819 Decreased white blood cell count, unspecified: Secondary | ICD-10-CM | POA: Diagnosis not present

## 2021-03-24 DIAGNOSIS — Z85830 Personal history of malignant neoplasm of bone: Secondary | ICD-10-CM | POA: Diagnosis not present

## 2021-03-24 DIAGNOSIS — C73 Malignant neoplasm of thyroid gland: Secondary | ICD-10-CM | POA: Diagnosis not present

## 2021-03-24 DIAGNOSIS — Z85850 Personal history of malignant neoplasm of thyroid: Secondary | ICD-10-CM | POA: Diagnosis not present

## 2021-03-24 DIAGNOSIS — Z9089 Acquired absence of other organs: Secondary | ICD-10-CM | POA: Diagnosis not present

## 2021-03-24 DIAGNOSIS — Z923 Personal history of irradiation: Secondary | ICD-10-CM | POA: Diagnosis not present

## 2021-03-24 DIAGNOSIS — F1090 Alcohol use, unspecified, uncomplicated: Secondary | ICD-10-CM | POA: Diagnosis not present

## 2021-03-25 ENCOUNTER — Other Ambulatory Visit (HOSPITAL_BASED_OUTPATIENT_CLINIC_OR_DEPARTMENT_OTHER): Payer: Self-pay

## 2021-03-31 DIAGNOSIS — Z8781 Personal history of (healed) traumatic fracture: Secondary | ICD-10-CM | POA: Diagnosis not present

## 2021-03-31 DIAGNOSIS — M8589 Other specified disorders of bone density and structure, multiple sites: Secondary | ICD-10-CM | POA: Diagnosis not present

## 2021-03-31 DIAGNOSIS — M8588 Other specified disorders of bone density and structure, other site: Secondary | ICD-10-CM | POA: Diagnosis not present

## 2021-03-31 DIAGNOSIS — Z87828 Personal history of other (healed) physical injury and trauma: Secondary | ICD-10-CM | POA: Diagnosis not present

## 2021-04-01 DIAGNOSIS — C7800 Secondary malignant neoplasm of unspecified lung: Secondary | ICD-10-CM | POA: Diagnosis not present

## 2021-04-01 DIAGNOSIS — R5383 Other fatigue: Secondary | ICD-10-CM | POA: Diagnosis not present

## 2021-04-01 DIAGNOSIS — C73 Malignant neoplasm of thyroid gland: Secondary | ICD-10-CM | POA: Diagnosis not present

## 2021-04-01 DIAGNOSIS — C7951 Secondary malignant neoplasm of bone: Secondary | ICD-10-CM | POA: Diagnosis not present

## 2021-04-01 DIAGNOSIS — E890 Postprocedural hypothyroidism: Secondary | ICD-10-CM | POA: Diagnosis not present

## 2021-04-01 DIAGNOSIS — E039 Hypothyroidism, unspecified: Secondary | ICD-10-CM | POA: Diagnosis not present

## 2021-04-01 DIAGNOSIS — M4850XD Collapsed vertebra, not elsewhere classified, site unspecified, subsequent encounter for fracture with routine healing: Secondary | ICD-10-CM | POA: Diagnosis not present

## 2021-04-01 DIAGNOSIS — R0602 Shortness of breath: Secondary | ICD-10-CM | POA: Diagnosis not present

## 2021-04-07 ENCOUNTER — Other Ambulatory Visit (HOSPITAL_BASED_OUTPATIENT_CLINIC_OR_DEPARTMENT_OTHER): Payer: Self-pay

## 2021-04-07 MED ORDER — LEVOTHYROXINE SODIUM 137 MCG PO TABS
ORAL_TABLET | ORAL | 3 refills | Status: AC
  Filled 2021-04-07: qty 90, 90d supply, fill #0
  Filled 2021-07-19: qty 90, 90d supply, fill #1

## 2021-04-11 ENCOUNTER — Other Ambulatory Visit (HOSPITAL_BASED_OUTPATIENT_CLINIC_OR_DEPARTMENT_OTHER): Payer: Self-pay

## 2021-04-12 DIAGNOSIS — D72819 Decreased white blood cell count, unspecified: Secondary | ICD-10-CM | POA: Diagnosis not present

## 2021-04-12 DIAGNOSIS — C73 Malignant neoplasm of thyroid gland: Secondary | ICD-10-CM | POA: Diagnosis not present

## 2021-04-14 DIAGNOSIS — C73 Malignant neoplasm of thyroid gland: Secondary | ICD-10-CM | POA: Diagnosis not present

## 2021-04-14 DIAGNOSIS — D72819 Decreased white blood cell count, unspecified: Secondary | ICD-10-CM | POA: Diagnosis not present

## 2021-04-15 ENCOUNTER — Other Ambulatory Visit (HOSPITAL_BASED_OUTPATIENT_CLINIC_OR_DEPARTMENT_OTHER): Payer: Self-pay

## 2021-04-16 DIAGNOSIS — R9431 Abnormal electrocardiogram [ECG] [EKG]: Secondary | ICD-10-CM | POA: Diagnosis not present

## 2021-04-16 DIAGNOSIS — R42 Dizziness and giddiness: Secondary | ICD-10-CM | POA: Diagnosis not present

## 2021-04-16 DIAGNOSIS — D649 Anemia, unspecified: Secondary | ICD-10-CM | POA: Diagnosis not present

## 2021-04-16 DIAGNOSIS — I672 Cerebral atherosclerosis: Secondary | ICD-10-CM | POA: Diagnosis not present

## 2021-04-16 DIAGNOSIS — E890 Postprocedural hypothyroidism: Secondary | ICD-10-CM | POA: Diagnosis not present

## 2021-04-16 DIAGNOSIS — R404 Transient alteration of awareness: Secondary | ICD-10-CM | POA: Diagnosis not present

## 2021-04-16 DIAGNOSIS — R55 Syncope and collapse: Secondary | ICD-10-CM | POA: Diagnosis not present

## 2021-04-16 DIAGNOSIS — Z743 Need for continuous supervision: Secondary | ICD-10-CM | POA: Diagnosis not present

## 2021-04-19 ENCOUNTER — Ambulatory Visit (INDEPENDENT_AMBULATORY_CARE_PROVIDER_SITE_OTHER): Payer: Medicare Other | Admitting: Family

## 2021-04-19 VITALS — BP 106/74 | HR 87 | Temp 98.4°F | Resp 16 | Wt 165.0 lb

## 2021-04-19 DIAGNOSIS — C73 Malignant neoplasm of thyroid gland: Secondary | ICD-10-CM

## 2021-04-19 DIAGNOSIS — E785 Hyperlipidemia, unspecified: Secondary | ICD-10-CM

## 2021-04-19 DIAGNOSIS — E538 Deficiency of other specified B group vitamins: Secondary | ICD-10-CM | POA: Diagnosis not present

## 2021-04-19 DIAGNOSIS — R42 Dizziness and giddiness: Secondary | ICD-10-CM | POA: Diagnosis not present

## 2021-04-19 DIAGNOSIS — C799 Secondary malignant neoplasm of unspecified site: Secondary | ICD-10-CM

## 2021-04-19 LAB — LIPID PANEL
Cholesterol: 224 mg/dL — ABNORMAL HIGH (ref 0–200)
HDL: 48.1 mg/dL (ref >39.00)
LDL Cholesterol: 150 mg/dL — ABNORMAL HIGH (ref 0–99)
NonHDL: 175.61
Total CHOL/HDL Ratio: 5
Triglycerides: 129 mg/dL (ref 0.0–149.0)
VLDL: 25.8 mg/dL (ref 0.0–40.0)

## 2021-04-19 LAB — VITAMIN B12: Vitamin B-12: 904 pg/mL (ref 211–911)

## 2021-04-19 NOTE — Assessment & Plan Note (Signed)
ED evaluation was negative. I would like to refer him to cardiology for further evaluation including event monitor.

## 2021-04-19 NOTE — Progress Notes (Signed)
Subjective:     Patient ID: Tyrone Washington, male    DOB: 1955/04/01, 66 y.o.   MRN: [id number]  Chief Complaint  Patient presents with   Hyperlipidemia    Here for follow up   B12 deficiency    Here for follow up    HPI Patient is in today for follow up.  Hyperlipidemia- Not currently on a statin. He is interested in repeating his cholesterol today.  Lab Results  Component Value Date   CHOL 222 (H) 12/03/2020   HDL 45.50 12/03/2020   LDLCALC 161 (H) 12/03/2020   TRIG 76.0 12/03/2020   CHOLHDL 5 12/03/2020   B12 deficiency-  Reports that he is taking oral b12 for the last few months. Lab Results  Component Value Date   VITAMINB12 403 12/03/2020   HSV2- uses valtrex prn outbreaks. Reports no recent outbreaks.   Thyroid cancer- following with endocrinology- Dr. Denton Lank. Plan is for him to follow up with oncology to start Zometa. He reports that he has follow up with oncology scheduled for 04/28/21. He is maintained on synthroid 137 mcg once daily.   Patient was seen in the ED on 04/16/21 due to episode of dizziness/near syncope.  ED record reviewed in care everywhere.  Troponin was negative x2. CBC noted mild anemia. CMET was WNL. CT angio of head and neck was performed and noted the following:   IMPRESSION:  1. Normal for age CTA Head and Neck. Minimal atherosclerosis. No  significant stenosis.  2. No acute intracranial abnormality by CT. Mild for age nonspecific  cerebral white matter changes.  3. Postoperative changes to the neck including prior thyroidectomy.  No neck mass or lymphadenopathy identified.  4. Negative visible upper chest.   He reports a recurrent dizzy spell last night.  States that he had just finished eating and was sitting watching television. Reports that dizziness lasted > 1 hour.  Did not have any associated numbness/weakness.  Dizziness is not made worse by standing or changing position in his head.   Health Maintenance Due  Topic Date Due    Zoster Vaccines- Shingrix (1 of 2) Never done   COLONOSCOPY (Pts 45-50yrs Insurance coverage will need to be confirmed)  09/03/2017    Past Medical History:  Diagnosis Date   Allergy    allergic rhinitis   Arthritis    B12 deficiency 04/10/2019   Cancer ([place]) 06/2019   thyroid cancer treatment   History of chicken pox    Hyperlipidemia    no meds now    Past Surgical History:  Procedure Laterality Date   COLONOSCOPY  2005   in [place]-normal exam   LAMINECTOMY N/A 05/13/2019   Procedure: THORACIC TEN LAMINECTOMY FOR TUMOR;  Surgeon: Earnie Larsson, MD;  Location: [facility];  Service: Neurosurgery;  Laterality: N/A;   THYROID SURGERY     "Years ago"    Family History  Problem Relation Age of Onset   Heart disease Father        patient is unsure of history   CVA Father    Heart attack Neg Hx    Colon cancer Neg Hx    Esophageal cancer Neg Hx    Rectal cancer Neg Hx    Stomach cancer Neg Hx     Social History   Socioeconomic History   Marital status: Single    Spouse name: Not on file   Number of children: 2   Years of education: Not on file   Highest education  level: Not on file  Occupational History    Employer: [employer]  Tobacco Use   Smoking status: Every Day    Types: Cigars   Smokeless tobacco: Never   Tobacco comments:    Smokes 1 - 2 cigars daily  Vaping Use   Vaping Use: Never used  Substance and Sexual Activity   Alcohol use: Yes    Alcohol/week: 2.0 standard drinks    Types: 2 Cans of beer per week   Drug use: Never   Sexual activity: Not on file  Other Topics Concern   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     Denies hobbies   No pets.    Social Determinants of Health   Financial Resource  Strain: Low Risk    Difficulty of Paying Living Expenses: Not hard at all  Food Insecurity: No Food Insecurity   Worried About Charity fundraiser in the Last Year: Never true   [place] in the Last Year: Never true  Transportation Needs: No Transportation Needs   Lack of Transportation (Medical): No   Lack of Transportation (Non-Medical): No  Physical Activity: Inactive   Days of Exercise per Week: 0 days   Minutes of Exercise per Session: 0 min  Stress: No Stress Concern Present   Feeling of Stress : Not at all  Social Connections: Socially Isolated   Frequency of Communication with Friends and Family: Three times a week   Frequency of Social Gatherings with Friends and Family: Three times a week   Attends Religious Services: Never   Active Member of Clubs or Organizations: No   Attends Archivist Meetings: Never   Marital Status: Never married  Human resources officer Violence: Not At Risk   Fear of Current or Ex-Partner: No   Emotionally Abused: No   Physically Abused: No   Sexually Abused: No    Outpatient Medications Prior to Visit  Medication Sig Dispense Refill   levothyroxine (SYNTHROID) 137 MCG tablet Take 1 tablet (137 mcg total) by mouth daily at 0600. 90 tablet 3   Multiple Vitamins-Minerals (CENTRUM SILVER 50+MEN) TABS Take 1 tablet by mouth daily.     valACYclovir (VALTREX) 500 MG tablet Take 1 tablet (500 mg total) by mouth daily. (Patient taking differently: Take 500 mg by mouth daily as needed (outbreaks).) 30 tablet 5   vitamin B-12 (CYANOCOBALAMIN) 1000 MCG tablet Take 1 tablet (1,000 mcg total) by mouth daily.     levothyroxine (SYNTHROID) 150 MCG tablet Take 1 tablet (150 mcg total) by mouth Daily at 0600. (Patient not taking: Reported on 04/19/2021) 90 tablet 3   No facility-administered medications prior to visit.    No Known Allergies  ROS     Objective:    Physical Exam Constitutional:      General: He is not in acute distress.     Appearance: He is well-developed.  HENT:     Head: Normocephalic and atraumatic.  Neck:     Comments: Surgical scarring left neck, otherwise neck exam is WNL Cardiovascular:     Rate and Rhythm: Normal rate and regular rhythm.     Heart sounds: No murmur heard. Pulmonary:  Effort: Pulmonary effort is normal. No respiratory distress.     Breath sounds: Normal breath sounds. No wheezing or rales.  Skin:    General: Skin is warm and dry.  Neurological:     Mental Status: He is alert and oriented to person, place, and time.  Psychiatric:        Behavior: Behavior normal.        Thought Content: Thought content normal.    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  Clinically he is stable.       Hyperlipidemia   Relevant Orders   Lipid panel   Dizziness    ED evaluation was negative. I would like to refer him to cardiology for further evaluation including event monitor.       Relevant Orders   Ambulatory referral to Cardiology   B12 deficiency - Primary    Continue b12 supplement.       Relevant Orders   B12    I am having Almond S. Muhammed maintain his valACYclovir, vitamin B-12, Centrum Silver 50+Men, and levothyroxine.  No orders of the defined types were placed in this encounter.

## 2021-04-19 NOTE — Assessment & Plan Note (Signed)
Continue b12 supplement.

## 2021-04-19 NOTE — Patient Instructions (Addendum)
Please consider getting your shingles shot and covid shot at the pharmacy.  Complete lab work prior to leaving. Go to the ER if you develop severe dizziness. You should be contacted about scheduling your appointment with cardiology.

## 2021-04-19 NOTE — Assessment & Plan Note (Signed)
He has follow up scheduled with Oncology to discuss Zometa.  Clinically he is stable.

## 2021-04-28 DIAGNOSIS — Z79899 Other long term (current) drug therapy: Secondary | ICD-10-CM | POA: Diagnosis not present

## 2021-04-28 DIAGNOSIS — R918 Other nonspecific abnormal finding of lung field: Secondary | ICD-10-CM | POA: Diagnosis not present

## 2021-04-28 DIAGNOSIS — C73 Malignant neoplasm of thyroid gland: Secondary | ICD-10-CM | POA: Diagnosis not present

## 2021-04-28 DIAGNOSIS — F17210 Nicotine dependence, cigarettes, uncomplicated: Secondary | ICD-10-CM | POA: Diagnosis not present

## 2021-04-28 DIAGNOSIS — M8580 Other specified disorders of bone density and structure, unspecified site: Secondary | ICD-10-CM | POA: Diagnosis not present

## 2021-05-18 ENCOUNTER — Ambulatory Visit (INDEPENDENT_AMBULATORY_CARE_PROVIDER_SITE_OTHER): Payer: Medicare Other | Admitting: Family

## 2021-05-18 DIAGNOSIS — E039 Hypothyroidism, unspecified: Secondary | ICD-10-CM

## 2021-05-18 DIAGNOSIS — C799 Secondary malignant neoplasm of unspecified site: Secondary | ICD-10-CM

## 2021-05-18 DIAGNOSIS — C73 Malignant neoplasm of thyroid gland: Secondary | ICD-10-CM

## 2021-05-18 DIAGNOSIS — R42 Dizziness and giddiness: Secondary | ICD-10-CM | POA: Diagnosis not present

## 2021-05-18 NOTE — Assessment & Plan Note (Signed)
Not positional. Had negative CT head.  Await cardiology input. If no cardiac cause is identified and pt continues to have symptoms of dizziness, would plan referral for vestibular rehab.  ?

## 2021-05-18 NOTE — Assessment & Plan Note (Signed)
Continues to follow with Oncology. He has been treated with RIA, last treatment 3/22.  ?

## 2021-05-18 NOTE — Progress Notes (Addendum)
? ?Subjective:  ? ?By signing my name below, I, Shehryar Baig, attest that this documentation has been prepared under the direction and in the presence of [name], NP. 05/18/2021 ? ? ? Patient ID: Tyrone Washington, male    DOB: 17-Feb-1956, 66 y.o.   MRN: [id number] ? ?Chief Complaint  ?Patient presents with  ? Dizziness  ?  Reports dizziness is about the same, appointment with cardiology tomorrow  ? ? ?Dizziness ? ?Patient is in today for a office visit.  ? ?Dizziness- He continues complaining of dizziness. His dizziness feels the same since last visit. He feels episode of dizziness 2-3x daily PO. He typically is sitting watching TV when the episodes occur. He regularly checks his blood pressure while at home and notes that his blood pressure increases while sitting up from sitting down. He is planning on following up with his cardiologist. ?BP Readings from Last 3 Encounters:  ?05/18/21 106/71  ?04/19/21 106/74  ?03/16/21 114/62  ? ?Pulse Readings from Last 3 Encounters:  ?05/18/21 78  ?04/19/21 87  ?03/16/21 76  ? ?Earwax- He reports having occasional excess cerumen flowing out of his ear. He regularly inserts a Q-tip in his ear to clear it.  ?Thyroid- He continues following up every month with his oncologist. He continues taking 137 mcg synthroid daily PO and reports no new issues while taking it.  ? ? ?Past Medical History:  ?Diagnosis Date  ? Allergy   ? allergic rhinitis  ? Arthritis   ? B12 deficiency 04/10/2019  ? Cancer ([place]) 06/2019  ? thyroid cancer treatment  ? History of chicken pox   ? Hyperlipidemia   ? no meds now  ? ? ?Past Surgical History:  ?Procedure Laterality Date  ? COLONOSCOPY  2005  ? in [place]-normal exam  ? LAMINECTOMY N/A 05/13/2019  ? Procedure: THORACIC TEN LAMINECTOMY FOR TUMOR;  Surgeon: Earnie Larsson, MD;  Location: [facility];  Service: Neurosurgery;  Laterality: N/A;  ? THYROID SURGERY    ? "Years ago"  ? ? ?Family History  ?Problem Relation Age of Onset  ? Heart disease  Father   ?     patient is unsure of history  ? CVA Father   ? Heart attack Neg Hx   ? Colon cancer Neg Hx   ? Esophageal cancer Neg Hx   ? Rectal cancer Neg Hx   ? Stomach cancer Neg Hx   ? ? ?Social History  ? ?Socioeconomic History  ? Marital status: Single  ?  Spouse name: Not on file  ? Number of children: 2  ? Years of education: Not on file  ? Highest education level: Not on file  ?Occupational History  ?  Employer: [employer]  ?Tobacco Use  ? Smoking status: Every Day  ?  Types: Cigars  ? Smokeless tobacco: Never  ? Tobacco comments:  ?  Smokes 1 - 2 cigars daily  ?Vaping Use  ? Vaping Use: Never used  ?Substance and Sexual Activity  ? Alcohol use: Yes  ?  Alcohol/week: 2.0 standard drinks  ?  Types: 2 Cans of beer per week  ? Drug use: Never  ? Sexual activity: Not on file  ?Other Topics Concern  ? Not on file  ?Social History Narrative  ? 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 Looking for full time work  ? Lives alone, single  ? 2 grown children (Daughter- Engineer, mining)  ? Son- [facility])  ? 1 grandchild in [place] (son's child)  ? Enjoys sleeping.    ? Denies hobbies  ? No pets.   ? ?Social Determinants of Health  ? ?Financial Resource Strain: Low Risk   ? Difficulty of Paying Living Expenses: Not hard at all  ?Food Insecurity: No Food Insecurity  ? Worried About Charity fundraiser in the Last Year: Never true  ? Ran Out of Food in the Last Year: Never true  ?Transportation Needs: No Transportation Needs  ? Lack of Transportation (Medical): No  ? Lack of Transportation (Non-Medical): No  ?Physical Activity: Inactive  ? Days of Exercise per Week: 0 days  ? Minutes of Exercise per Session: 0 min  ?Stress: No Stress Concern Present  ? Feeling of Stress : Not at all  ?Social Connections: Socially Isolated  ? Frequency of Communication with Friends and Family: Three times a week  ? Frequency of Social Gatherings with Friends and Family: Three times a week  ? Attends  Religious Services: Never  ? Active Member of Clubs or Organizations: No  ? Attends Archivist Meetings: Never  ? Marital Status: Never married  ?Intimate Partner Violence: Not At Risk  ? Fear of Current or Ex-Partner: No  ? Emotionally Abused: No  ? Physically Abused: No  ? Sexually Abused: No  ? ? ?Outpatient Medications Prior to Visit  ?Medication Sig Dispense Refill  ? levothyroxine (SYNTHROID) 137 MCG tablet Take 1 tablet (137 mcg total) by mouth daily at 0600. 90 tablet 3  ? Multiple Vitamins-Minerals (CENTRUM SILVER 50+MEN) TABS Take 1 tablet by mouth daily.    ? valACYclovir (VALTREX) 500 MG tablet Take 1 tablet (500 mg total) by mouth daily. (Patient taking differently: Take 500 mg by mouth daily as needed (outbreaks).) 30 tablet 5  ? vitamin B-12 (CYANOCOBALAMIN) 1000 MCG tablet Take 1 tablet (1,000 mcg total) by mouth daily.    ? ?No facility-administered medications prior to visit.  ? ? ?No Known Allergies ? ?Review of Systems  ?HENT:    ?     (+)occasional cerumen discharge  ?Neurological:  Positive for dizziness.  ? ?   ?Objective:  ?  ?Physical Exam ?Constitutional:   ?   General: He is not in acute distress. ?   Appearance: Normal appearance. He is not ill-appearing.  ?HENT:  ?   Head: Normocephalic and atraumatic.  ?   Right Ear: Tympanic membrane, ear canal and external ear normal.  ?   Left Ear: Tympanic membrane, ear canal and external ear normal.  ?Eyes:  ?   Extraocular Movements: Extraocular movements intact.  ?   Pupils: Pupils are equal, round, and reactive to light.  ?Neck:  ?   Comments: No neck mass noted ?Cardiovascular:  ?   Rate and Rhythm: Normal rate and regular rhythm.  ?   Heart sounds: Normal heart sounds. No murmur heard. ?  No gallop.  ?Pulmonary:  ?   Effort: Pulmonary effort is normal. No respiratory distress.  ?   Breath sounds: Normal breath sounds. No wheezing or rales.  ?Skin: ?   General: Skin is warm and dry.  ?Neurological:  ?   Mental Status: He is alert and  oriented to person, place, and time.  ?Psychiatric:     ?   Judgment: Judgment normal.  ? ? ?BP 106/71 (BP Location: Right Arm, Patient Position: Sitting, Cuff Size: Small)   Pulse 78   Temp 98 ?F (36.7 ?C) (Oral)  Resp 16   Wt 163 lb (73.9 kg)   SpO2 100%   BMI 21.51 kg/m?  ?Wt Readings from Last 3 Encounters:  ?05/18/21 163 lb (73.9 kg)  ?04/19/21 165 lb (74.8 kg)  ?03/16/21 166 lb 9.6 oz (75.6 kg)  ? ? ?   ?Assessment & Plan:  ? ?Problem List Items Addressed This Visit   ? ?  ? Unprioritized  ? 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  Continues to follow with Oncology. He has been treated with RIA, last treatment 3/22.  ?  ?  ? Hypothyroid  ?  This is being managed by Dr. Denton Lank with synthroid.  ?  ?  ? Dizziness  ?  Not positional. Had negative CT head.  Await cardiology input. If no cardiac cause is identified and pt continues to have symptoms of dizziness, would plan referral for vestibular rehab.  ?  ?  ? ?22 minutes spent on today's visit. Time was spent interviewing/counseling patient and reviewing outside records.  ? ?No orders of the defined types were placed in this encounter. ? ? ?I, Nance Pear, NP, personally preformed the services described in this documentation.  All medical record entries made by the scribe were at my direction and in my presence.  I have reviewed the chart and discharge instructions (if applicable) and agree that the record reflects my personal performance and is accurate and complete. 05/18/2021 ? ? ?Engineering geologist as a Education administrator for [employer], NP.,have documented all relevant documentation on the behalf of Nance Pear, NP,as directed by  Nance Pear, NP while in the presence of Nance Pear, NP. ? ? ?Nance Pear, NP ? ?

## 2021-05-18 NOTE — Assessment & Plan Note (Signed)
This is being managed by Dr. Denton Lank with synthroid.  ?

## 2021-05-19 ENCOUNTER — Encounter: Payer: Self-pay | Admitting: Cardiology

## 2021-05-19 ENCOUNTER — Other Ambulatory Visit: Payer: Self-pay

## 2021-05-19 ENCOUNTER — Ambulatory Visit (INDEPENDENT_AMBULATORY_CARE_PROVIDER_SITE_OTHER): Payer: Medicare Other

## 2021-05-19 ENCOUNTER — Ambulatory Visit (INDEPENDENT_AMBULATORY_CARE_PROVIDER_SITE_OTHER): Payer: Medicare Other | Admitting: Cardiology

## 2021-05-19 ENCOUNTER — Other Ambulatory Visit (HOSPITAL_BASED_OUTPATIENT_CLINIC_OR_DEPARTMENT_OTHER): Payer: Self-pay

## 2021-05-19 VITALS — Ht 72.0 in | Wt 168.0 lb

## 2021-05-19 DIAGNOSIS — R42 Dizziness and giddiness: Secondary | ICD-10-CM

## 2021-05-19 DIAGNOSIS — E782 Mixed hyperlipidemia: Secondary | ICD-10-CM

## 2021-05-19 DIAGNOSIS — R0609 Other forms of dyspnea: Secondary | ICD-10-CM

## 2021-05-19 DIAGNOSIS — C73 Malignant neoplasm of thyroid gland: Secondary | ICD-10-CM | POA: Diagnosis not present

## 2021-05-19 MED ORDER — ATORVASTATIN CALCIUM 10 MG PO TABS
10.0000 mg | ORAL_TABLET | Freq: Every day | ORAL | 3 refills | Status: AC
  Filled 2021-05-19: qty 90, 90d supply, fill #0
  Filled 2021-08-19: qty 90, 90d supply, fill #1

## 2021-05-19 NOTE — Addendum Note (Signed)
Addended by: Edwyna Shell I on: 05/19/2021 10:28 AM ? ? Modules accepted: Orders ? ?

## 2021-05-19 NOTE — Progress Notes (Signed)
? ?Cardiology Consultation:   ? ?Date:  05/19/2021  ? ?ID:  Tyrone Washington, DOB September 18, 1955, MRN [id number] ? ?PCP:  [name], NP  ?Cardiologist:  Jenne Campus, MD  ? ?Referring MD: [name], NP  ? ?Chief Complaint  ?Patient presents with  ? Dizziness  ? ? ?History of Present Illness:   ? ?Tyrone Washington is a 66 y.o. male who is being seen today for the evaluation of dizziness at the request of [name], NP.  With past medical history significant for follicular thyroid carcinoma apparently with metastasis, dyslipidemia.  He was referred to [place] because of dizziness.  He said he has been having dizziness from weeks to months already.  Does dizziness happen all the time.  However recently he ended up going to the emergency room because he got up very quickly became dizzy to the point that he almost passed out.  He did not completely passed out work-up done in the emergency room was negative he was referred to [place].  He denies have any palpitations, there is no tightness squeezing pressure burning chest.  He is to exercise on the regular basis but since neck surgeries he said he does not do much.  He does have dyslipidemia he was taking some cholesterol medication but those were recently stopped.  He smokes cigars from time to time.  Does not have family history of sudden cardiac death or premature coronary artery disease. ? ?Past Medical History:  ?Diagnosis Date  ? Allergy   ? allergic rhinitis  ? Arthritis   ? B12 deficiency 04/10/2019  ? Cancer ([place]) 06/2019  ? thyroid cancer treatment  ? History of chicken pox   ? Hyperlipidemia   ? no meds now  ? ? ?Past Surgical History:  ?Procedure Laterality Date  ? COLONOSCOPY  2005  ? in [place]-normal exam  ? LAMINECTOMY N/A 05/13/2019  ? Procedure: THORACIC TEN LAMINECTOMY FOR TUMOR;  Surgeon: Earnie Larsson, MD;  Location: [facility];  Service: Neurosurgery;  Laterality: N/A;  ? THYROID SURGERY    ? "Years ago"  ? ? ?Current  Medications: ?Current Meds  ?Medication Sig  ? levothyroxine (SYNTHROID) 137 MCG tablet Take 1 tablet (137 mcg total) by mouth daily at 0600. (Patient taking differently: Take 137 mcg by mouth daily before breakfast.)  ? Multiple Vitamins-Minerals (CENTRUM SILVER 50+MEN) TABS Take 1 tablet by mouth daily.  ? valACYclovir (VALTREX) 500 MG tablet Take 1 tablet (500 mg total) by mouth daily. (Patient taking differently: Take 500 mg by mouth daily as needed (outbreaks).)  ? vitamin B-12 (CYANOCOBALAMIN) 1000 MCG tablet Take 1 tablet (1,000 mcg total) by mouth daily.  ?  ? ?Allergies:   Patient has no known allergies.  ? ?Social History  ? ?Socioeconomic History  ? Marital status: Single  ?  Spouse name: Not on file  ? Number of children: 2  ? Years of education: Not on file  ? Highest education level: Not on file  ?Occupational History  ?  Employer: [employer]  ?Tobacco Use  ? Smoking status: Every Day  ?  Types: Cigars  ? Smokeless tobacco: Never  ? Tobacco comments:  ?  Smokes 1 - 2 cigars daily  ?Vaping Use  ? Vaping Use: Never used  ?Substance and Sexual Activity  ? Alcohol use: Yes  ?  Alcohol/week: 2.0 standard drinks  ?  Types: 2 Cans of beer per week  ? Drug use: Never  ? Sexual activity: Not on file  ?Other  Topics Concern  ? Not on file  ?Social History Narrative  ? 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 Looking for full time work  ? Lives alone, single  ? 2 grown children (Daughter- Engineer, mining)  ? Son- [employer])  ? 1 grandchild in [place] (son's child)  ? Enjoys sleeping.    ? Denies hobbies  ? No pets.   ? ?Social Determinants of Health  ? ?Financial Resource Strain: Low Risk   ? Difficulty of Paying Living Expenses: Not hard at all  ?Food Insecurity: No Food Insecurity  ? Worried About Charity fundraiser in the Last Year: Never true  ? Ran Out of Food in the Last Year: Never true  ?Transportation Needs: No Transportation Needs  ? Lack of Transportation (Medical): No  ?  Lack of Transportation (Non-Medical): No  ?Physical Activity: Inactive  ? Days of Exercise per Week: 0 days  ? Minutes of Exercise per Session: 0 min  ?Stress: No Stress Concern Present  ? Feeling of Stress : Not at all  ?Social Connections: Socially Isolated  ? Frequency of Communication with Friends and Family: Three times a week  ? Frequency of Social Gatherings with Friends and Family: Three times a week  ? Attends Religious Services: Never  ? Active Member of Clubs or Organizations: No  ? Attends Archivist Meetings: Never  ? Marital Status: Never married  ?  ? ?Family History: ?The patient's family history includes CVA in his father; Heart disease in his father. There is no history of Heart attack, Tyrone cancer, Esophageal cancer, Rectal cancer, or Stomach cancer. ?ROS:   ?Please see the history of present illness.    ?All 14 point review of systems negative except as described per history of present illness. ? ?EKGs/Labs/Other Studies Reviewed:   ? ?The following studies were reviewed today: ? ? ?EKG:  EKG is  ordered today.  The ekg ordered today demonstrates normal sinus rhythm, normal P interval, nonspecific ST-T segment changes ? ?Recent Labs: ?12/03/2020: ALT 8; BUN 15; Creatinine, Ser 1.01; Potassium 4.5; Sodium 140  ?Recent Lipid Panel ?   ?Component Value Date/Time  ? CHOL 224 (H) 04/19/2021 1055  ? TRIG 129.0 04/19/2021 1055  ? HDL 48.10 04/19/2021 1055  ? CHOLHDL 5 04/19/2021 1055  ? VLDL 25.8 04/19/2021 1055  ? LDLCALC 150 (H) 04/19/2021 1055  ? ? ?Physical Exam:   ? ?VS:  Ht 6' (1.829 m)   Wt 168 lb (76.2 kg)   SpO2 96%   BMI 22.78 kg/m?    ? ?Wt Readings from Last 3 Encounters:  ?05/19/21 168 lb (76.2 kg)  ?05/18/21 163 lb (73.9 kg)  ?04/19/21 165 lb (74.8 kg)  ?  ? ?GEN:  Well nourished, well developed in no acute distress ?HEENT: Normal ?NECK: No JVD; No carotid bruits ?LYMPHATICS: No lymphadenopathy ?CARDIAC: RRR, no murmurs, no rubs, no gallops ?RESPIRATORY:  Clear to auscultation  without rales, wheezing or rhonchi  ?ABDOMEN: Soft, non-tender, non-distended ?MUSCULOSKELETAL:  No edema; No deformity  ?SKIN: Warm and dry ?NEUROLOGIC:  Alert and oriented x 3 ?PSYCHIATRIC:  Normal affect  ? ?ASSESSMENT:   ? ?1. Dizziness   ?2. Dyspnea on exertion   ?3. 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 Mixed hyperlipidemia   ? ?PLAN:   ? ?In order of problems listed above: ? ?Dizziness rather unlikely to be related to rhythm issue however for completion I think he  deserves to wear Zio patch for 2 weeks to make sure there is no significant arrhythmia.  As a part of evaluation I will schedule him to have an echocardiogram to assess left ventricle ejection fraction.  I will not put him on any medications since I have read a low level suspicion that this is arrhythmic dizziness.  Look like more orthostatic with some dehydration and ask him to stay well-hydrated which he is already doing. ?Dyspnea on exertion: Echocardiogram will be done to clarify that. ?Thyroid cancer.  That being followed by internal medicine team as well as oncology. ?Mixed dyslipidemia I did review his K PN which show me his LDL of 150 HDL 48.  I did calculate he is 10 years risk of coronary events which was 12.3.  It is in the intermediate.  I recommended starting statin, he accepted.  We will start him with Lipitor 10 mg daily.  Fasting lipid profile will be done in 6 weeks. ? ? ?Medication Adjustments/Labs and Tests Ordered: ?Current medicines are reviewed at length with the patient today.  Concerns regarding medicines are outlined above.  ?No orders of the defined types were placed in this encounter. ? ?No orders of the defined types were placed in this encounter. ? ? ?Signed, ?Park Liter, MD, [facility]. ?05/19/2021 10:16 AM    ?[place] ?

## 2021-05-19 NOTE — Patient Instructions (Signed)
Medication Instructions:  ?Your physician has recommended you make the following change in your medication:  ? ?START: Lipitor 10 mg daily ? ?*If you need a refill on your cardiac medications before your next appointment, please call your pharmacy* ? ? ?Lab Work: ?Your physician recommends that you return for lab work in:  ? ?Labs in 6 weeks: Lipid, LFT's ? ?If you have labs (blood work) drawn today and your tests are completely normal, you will receive your results only by: ?MyChart Message (if you have MyChart) OR ?A paper copy in the mail ?If you have any lab test that is abnormal or we need to change your treatment, we will call you to review the results. ? ? ?Testing/Procedures: ?Your physician has requested that you have an echocardiogram. Echocardiography is a painless test that uses sound waves to create images of your heart. It provides your doctor with information about the size and shape of your heart and how well your heart?s chambers and valves are working. This procedure takes approximately one hour. There are no restrictions for this procedure. ? ?A zio monitor was ordered today. It will remain on for 14 days. You will then return monitor and event diary in provided box. It takes 1-2 weeks for report to be downloaded and returned to [place]. We will call you with the results. If monitor falls off or has orange flashing light, please call Zio for further instructions.  ? ? ? ?Follow-Up: ?At [facility], you and your health needs are our priority.  As part of our continuing mission to provide you with exceptional heart care, we have created designated Provider Care Teams.  These Care Teams include your primary Cardiologist (physician) and Advanced Practice Providers (APPs -  Physician Assistants and Nurse Practitioners) who all work together to provide you with the care you need, when you need it. ? ?We recommend signing up for the patient portal called "MyChart".  Sign up information is provided on this  After Visit Summary.  MyChart is used to connect with patients for Virtual Visits (Telemedicine).  Patients are able to view lab/test results, encounter notes, upcoming appointments, etc.  Non-urgent messages can be sent to your provider as well.   ?To learn more about what you can do with MyChart, go to [email or web address].   ? ?Your next appointment:   ?2 month(s) ? ?The format for your next appointment:   ?In Person ? ?Provider:   ?Jenne Campus, MD  ? ? ?Other Instructions ?None ? ?

## 2021-05-20 LAB — LIPID PANEL
Chol/HDL Ratio: 5.1 ratio — ABNORMAL HIGH (ref 0.0–5.0)
Cholesterol, Total: 230 mg/dL — ABNORMAL HIGH (ref 100–199)
HDL: 45 mg/dL (ref >39)
LDL Chol Calc (NIH): 154 mg/dL — ABNORMAL HIGH (ref 0–99)
Triglycerides: 172 mg/dL — ABNORMAL HIGH (ref 0–149)
VLDL Cholesterol Cal: 31 mg/dL (ref 5–40)

## 2021-05-20 LAB — HEPATIC FUNCTION PANEL
ALT: 11 IU/L (ref 0–44)
AST: 15 IU/L (ref 0–40)
Albumin: 4.3 g/dL (ref 3.8–4.8)
Alkaline Phosphatase: 79 IU/L (ref 44–121)
Bilirubin Total: 0.2 mg/dL (ref 0.0–1.2)
Bilirubin, Direct: <0.1 mg/dL (ref 0.00–0.40)
Total Protein: 6.6 g/dL (ref 6.0–8.5)

## 2021-05-26 ENCOUNTER — Ambulatory Visit (HOSPITAL_BASED_OUTPATIENT_CLINIC_OR_DEPARTMENT_OTHER)
Admission: RE | Admit: 2021-05-26 | Discharge: 2021-05-26 | Disposition: A | Discharge status: Home / Self Care | Payer: Medicare Other | Source: Ambulatory Visit | Attending: Cardiology | Admitting: Cardiology

## 2021-05-26 ENCOUNTER — Other Ambulatory Visit: Payer: Self-pay

## 2021-05-26 DIAGNOSIS — R0609 Other forms of dyspnea: Secondary | ICD-10-CM | POA: Diagnosis not present

## 2021-05-26 DIAGNOSIS — C73 Malignant neoplasm of thyroid gland: Secondary | ICD-10-CM | POA: Diagnosis not present

## 2021-05-26 DIAGNOSIS — R42 Dizziness and giddiness: Secondary | ICD-10-CM | POA: Diagnosis not present

## 2021-05-26 DIAGNOSIS — E782 Mixed hyperlipidemia: Secondary | ICD-10-CM | POA: Diagnosis not present

## 2021-05-26 LAB — ECHOCARDIOGRAM COMPLETE
AR max vel: 3.16 cm2
AV Area VTI: 3.23 cm2
AV Area mean vel: 2.93 cm2
AV Mean grad: 2 mmHg
AV Peak grad: 2.8 mmHg
Ao pk vel: 0.84 m/s
Area-P 1/2: 3.24 cm2
S' Lateral: 3.2 cm

## 2021-05-26 NOTE — Progress Notes (Signed)
?  Echocardiogram ?2D Echocardiogram has been performed. ? ?Elmer Ramp ?05/26/2021, 8:17 AM ?

## 2021-06-03 ENCOUNTER — Telehealth: Payer: Self-pay

## 2021-06-03 NOTE — Telephone Encounter (Signed)
-----   Message from Park Liter, MD sent at 05/23/2021  3:37 PM EDT ----- ?Cholesterol still not well controlled.  Please continue present management including statin and then recheck fasting lipid profile 6 weeks I am not sure if this cholesterol we have now is on medications or not. ?

## 2021-06-03 NOTE — Telephone Encounter (Signed)
Unable to reach the patient. Left a message to return my call and mailed a letter requesting a call back.  ?

## 2021-06-06 ENCOUNTER — Other Ambulatory Visit: Payer: Self-pay

## 2021-06-06 DIAGNOSIS — E785 Hyperlipidemia, unspecified: Secondary | ICD-10-CM

## 2021-06-06 NOTE — Telephone Encounter (Signed)
Patient informed of results and orders placed for lab work in 6 weeks. ?

## 2021-06-06 NOTE — Telephone Encounter (Signed)
Patient returning call.

## 2021-06-08 DIAGNOSIS — R42 Dizziness and giddiness: Secondary | ICD-10-CM | POA: Diagnosis not present

## 2021-06-08 DIAGNOSIS — R0609 Other forms of dyspnea: Secondary | ICD-10-CM | POA: Diagnosis not present

## 2021-06-08 DIAGNOSIS — E782 Mixed hyperlipidemia: Secondary | ICD-10-CM | POA: Diagnosis not present

## 2021-06-16 ENCOUNTER — Encounter (HOSPITAL_COMMUNITY): Payer: Self-pay

## 2021-06-16 ENCOUNTER — Ambulatory Visit (HOSPITAL_COMMUNITY): Admit: 2021-06-16 | Payer: Medicare Other | Admitting: Gastroenterology

## 2021-06-16 SURGERY — COLONOSCOPY WITH PROPOFOL
Anesthesia: Monitor Anesthesia Care

## 2021-06-28 DIAGNOSIS — C7802 Secondary malignant neoplasm of left lung: Secondary | ICD-10-CM | POA: Diagnosis not present

## 2021-06-28 DIAGNOSIS — E890 Postprocedural hypothyroidism: Secondary | ICD-10-CM | POA: Diagnosis not present

## 2021-06-28 DIAGNOSIS — C7951 Secondary malignant neoplasm of bone: Secondary | ICD-10-CM | POA: Diagnosis not present

## 2021-06-28 DIAGNOSIS — D72819 Decreased white blood cell count, unspecified: Secondary | ICD-10-CM | POA: Diagnosis not present

## 2021-06-28 DIAGNOSIS — F17290 Nicotine dependence, other tobacco product, uncomplicated: Secondary | ICD-10-CM | POA: Diagnosis not present

## 2021-06-28 DIAGNOSIS — E039 Hypothyroidism, unspecified: Secondary | ICD-10-CM | POA: Diagnosis not present

## 2021-06-28 DIAGNOSIS — C73 Malignant neoplasm of thyroid gland: Secondary | ICD-10-CM | POA: Diagnosis not present

## 2021-06-28 DIAGNOSIS — C7801 Secondary malignant neoplasm of right lung: Secondary | ICD-10-CM | POA: Diagnosis not present

## 2021-06-28 DIAGNOSIS — R946 Abnormal results of thyroid function studies: Secondary | ICD-10-CM | POA: Diagnosis not present

## 2021-07-08 DIAGNOSIS — C73 Malignant neoplasm of thyroid gland: Secondary | ICD-10-CM | POA: Diagnosis not present

## 2021-07-08 DIAGNOSIS — C7951 Secondary malignant neoplasm of bone: Secondary | ICD-10-CM | POA: Diagnosis not present

## 2021-07-11 DIAGNOSIS — C73 Malignant neoplasm of thyroid gland: Secondary | ICD-10-CM | POA: Diagnosis not present

## 2021-07-11 DIAGNOSIS — R599 Enlarged lymph nodes, unspecified: Secondary | ICD-10-CM | POA: Diagnosis not present

## 2021-07-11 DIAGNOSIS — M898X8 Other specified disorders of bone, other site: Secondary | ICD-10-CM | POA: Diagnosis not present

## 2021-07-19 ENCOUNTER — Other Ambulatory Visit (HOSPITAL_BASED_OUTPATIENT_CLINIC_OR_DEPARTMENT_OTHER): Payer: Self-pay

## 2021-07-20 ENCOUNTER — Other Ambulatory Visit (HOSPITAL_BASED_OUTPATIENT_CLINIC_OR_DEPARTMENT_OTHER): Payer: Self-pay

## 2021-07-21 ENCOUNTER — Other Ambulatory Visit (HOSPITAL_BASED_OUTPATIENT_CLINIC_OR_DEPARTMENT_OTHER): Payer: Self-pay

## 2021-07-21 DIAGNOSIS — C7951 Secondary malignant neoplasm of bone: Secondary | ICD-10-CM | POA: Diagnosis not present

## 2021-07-21 DIAGNOSIS — C73 Malignant neoplasm of thyroid gland: Secondary | ICD-10-CM | POA: Diagnosis not present

## 2021-07-21 DIAGNOSIS — C7800 Secondary malignant neoplasm of unspecified lung: Secondary | ICD-10-CM | POA: Diagnosis not present

## 2021-07-21 DIAGNOSIS — Z923 Personal history of irradiation: Secondary | ICD-10-CM | POA: Diagnosis not present

## 2021-07-21 DIAGNOSIS — E890 Postprocedural hypothyroidism: Secondary | ICD-10-CM | POA: Diagnosis not present

## 2021-07-22 ENCOUNTER — Other Ambulatory Visit (HOSPITAL_BASED_OUTPATIENT_CLINIC_OR_DEPARTMENT_OTHER): Payer: Self-pay

## 2021-07-25 ENCOUNTER — Other Ambulatory Visit (HOSPITAL_BASED_OUTPATIENT_CLINIC_OR_DEPARTMENT_OTHER): Payer: Self-pay

## 2021-07-26 ENCOUNTER — Other Ambulatory Visit (HOSPITAL_BASED_OUTPATIENT_CLINIC_OR_DEPARTMENT_OTHER): Payer: Self-pay

## 2021-07-26 DIAGNOSIS — C73 Malignant neoplasm of thyroid gland: Secondary | ICD-10-CM | POA: Diagnosis not present

## 2021-07-26 DIAGNOSIS — E890 Postprocedural hypothyroidism: Secondary | ICD-10-CM | POA: Diagnosis not present

## 2021-07-26 DIAGNOSIS — R918 Other nonspecific abnormal finding of lung field: Secondary | ICD-10-CM | POA: Diagnosis not present

## 2021-07-26 DIAGNOSIS — C7951 Secondary malignant neoplasm of bone: Secondary | ICD-10-CM | POA: Diagnosis not present

## 2021-07-26 DIAGNOSIS — Z9889 Other specified postprocedural states: Secondary | ICD-10-CM | POA: Diagnosis not present

## 2021-07-26 DIAGNOSIS — Z510 Encounter for antineoplastic radiation therapy: Secondary | ICD-10-CM | POA: Diagnosis not present

## 2021-07-26 MED ORDER — LEVOTHYROXINE SODIUM 137 MCG PO TABS: ORAL_TABLET | ORAL | 3 refills | Status: DC

## 2021-07-27 DIAGNOSIS — C7951 Secondary malignant neoplasm of bone: Secondary | ICD-10-CM | POA: Diagnosis not present

## 2021-07-27 DIAGNOSIS — Z510 Encounter for antineoplastic radiation therapy: Secondary | ICD-10-CM | POA: Diagnosis not present

## 2021-07-27 DIAGNOSIS — C73 Malignant neoplasm of thyroid gland: Secondary | ICD-10-CM | POA: Diagnosis not present

## 2021-07-27 DIAGNOSIS — E890 Postprocedural hypothyroidism: Secondary | ICD-10-CM | POA: Diagnosis not present

## 2021-07-27 DIAGNOSIS — R918 Other nonspecific abnormal finding of lung field: Secondary | ICD-10-CM | POA: Diagnosis not present

## 2021-07-27 DIAGNOSIS — Z9889 Other specified postprocedural states: Secondary | ICD-10-CM | POA: Diagnosis not present

## 2021-07-28 DIAGNOSIS — C73 Malignant neoplasm of thyroid gland: Secondary | ICD-10-CM | POA: Diagnosis not present

## 2021-07-28 DIAGNOSIS — R918 Other nonspecific abnormal finding of lung field: Secondary | ICD-10-CM | POA: Diagnosis not present

## 2021-07-28 DIAGNOSIS — C7951 Secondary malignant neoplasm of bone: Secondary | ICD-10-CM | POA: Diagnosis not present

## 2021-07-28 DIAGNOSIS — E890 Postprocedural hypothyroidism: Secondary | ICD-10-CM | POA: Diagnosis not present

## 2021-07-28 DIAGNOSIS — Z510 Encounter for antineoplastic radiation therapy: Secondary | ICD-10-CM | POA: Diagnosis not present

## 2021-07-28 DIAGNOSIS — Z9889 Other specified postprocedural states: Secondary | ICD-10-CM | POA: Diagnosis not present

## 2021-07-29 ENCOUNTER — Other Ambulatory Visit (HOSPITAL_BASED_OUTPATIENT_CLINIC_OR_DEPARTMENT_OTHER): Payer: Self-pay

## 2021-07-29 DIAGNOSIS — M8448XA Pathological fracture, other site, initial encounter for fracture: Secondary | ICD-10-CM | POA: Diagnosis not present

## 2021-07-29 DIAGNOSIS — Z510 Encounter for antineoplastic radiation therapy: Secondary | ICD-10-CM | POA: Diagnosis not present

## 2021-07-29 DIAGNOSIS — E890 Postprocedural hypothyroidism: Secondary | ICD-10-CM | POA: Diagnosis not present

## 2021-07-29 DIAGNOSIS — C73 Malignant neoplasm of thyroid gland: Secondary | ICD-10-CM | POA: Diagnosis not present

## 2021-07-29 DIAGNOSIS — C7802 Secondary malignant neoplasm of left lung: Secondary | ICD-10-CM | POA: Diagnosis not present

## 2021-07-29 DIAGNOSIS — Z9889 Other specified postprocedural states: Secondary | ICD-10-CM | POA: Diagnosis not present

## 2021-07-29 DIAGNOSIS — R5383 Other fatigue: Secondary | ICD-10-CM | POA: Diagnosis not present

## 2021-07-29 DIAGNOSIS — C7951 Secondary malignant neoplasm of bone: Secondary | ICD-10-CM | POA: Diagnosis not present

## 2021-07-29 DIAGNOSIS — R918 Other nonspecific abnormal finding of lung field: Secondary | ICD-10-CM | POA: Diagnosis not present

## 2021-07-29 DIAGNOSIS — R519 Headache, unspecified: Secondary | ICD-10-CM | POA: Diagnosis not present

## 2021-07-29 MED ORDER — TRAMADOL HCL 50 MG PO TABS
ORAL_TABLET | ORAL | 1 refills | Status: DC
  Filled 2021-07-29: qty 56, 7d supply, fill #0

## 2021-08-01 DIAGNOSIS — C7951 Secondary malignant neoplasm of bone: Secondary | ICD-10-CM | POA: Diagnosis not present

## 2021-08-01 DIAGNOSIS — E890 Postprocedural hypothyroidism: Secondary | ICD-10-CM | POA: Diagnosis not present

## 2021-08-01 DIAGNOSIS — Z510 Encounter for antineoplastic radiation therapy: Secondary | ICD-10-CM | POA: Diagnosis not present

## 2021-08-01 DIAGNOSIS — Z9889 Other specified postprocedural states: Secondary | ICD-10-CM | POA: Diagnosis not present

## 2021-08-01 DIAGNOSIS — C73 Malignant neoplasm of thyroid gland: Secondary | ICD-10-CM | POA: Diagnosis not present

## 2021-08-01 DIAGNOSIS — R918 Other nonspecific abnormal finding of lung field: Secondary | ICD-10-CM | POA: Diagnosis not present

## 2021-08-02 DIAGNOSIS — C73 Malignant neoplasm of thyroid gland: Secondary | ICD-10-CM | POA: Diagnosis not present

## 2021-08-02 DIAGNOSIS — Z9889 Other specified postprocedural states: Secondary | ICD-10-CM | POA: Diagnosis not present

## 2021-08-02 DIAGNOSIS — C7951 Secondary malignant neoplasm of bone: Secondary | ICD-10-CM | POA: Diagnosis not present

## 2021-08-02 DIAGNOSIS — Z510 Encounter for antineoplastic radiation therapy: Secondary | ICD-10-CM | POA: Diagnosis not present

## 2021-08-02 DIAGNOSIS — E890 Postprocedural hypothyroidism: Secondary | ICD-10-CM | POA: Diagnosis not present

## 2021-08-02 DIAGNOSIS — R918 Other nonspecific abnormal finding of lung field: Secondary | ICD-10-CM | POA: Diagnosis not present

## 2021-08-03 DIAGNOSIS — R918 Other nonspecific abnormal finding of lung field: Secondary | ICD-10-CM | POA: Diagnosis not present

## 2021-08-03 DIAGNOSIS — E890 Postprocedural hypothyroidism: Secondary | ICD-10-CM | POA: Diagnosis not present

## 2021-08-03 DIAGNOSIS — Z510 Encounter for antineoplastic radiation therapy: Secondary | ICD-10-CM | POA: Diagnosis not present

## 2021-08-03 DIAGNOSIS — C7951 Secondary malignant neoplasm of bone: Secondary | ICD-10-CM | POA: Diagnosis not present

## 2021-08-03 DIAGNOSIS — Z9889 Other specified postprocedural states: Secondary | ICD-10-CM | POA: Diagnosis not present

## 2021-08-03 DIAGNOSIS — C73 Malignant neoplasm of thyroid gland: Secondary | ICD-10-CM | POA: Diagnosis not present

## 2021-08-04 ENCOUNTER — Ambulatory Visit (INDEPENDENT_AMBULATORY_CARE_PROVIDER_SITE_OTHER): Payer: Medicare Other | Admitting: Cardiology

## 2021-08-04 ENCOUNTER — Encounter: Payer: Self-pay | Admitting: Cardiology

## 2021-08-04 VITALS — BP 98/60 | HR 96 | Ht 72.0 in | Wt 160.0 lb

## 2021-08-04 DIAGNOSIS — Z510 Encounter for antineoplastic radiation therapy: Secondary | ICD-10-CM | POA: Diagnosis not present

## 2021-08-04 DIAGNOSIS — E890 Postprocedural hypothyroidism: Secondary | ICD-10-CM | POA: Diagnosis not present

## 2021-08-04 DIAGNOSIS — R42 Dizziness and giddiness: Secondary | ICD-10-CM

## 2021-08-04 DIAGNOSIS — C7951 Secondary malignant neoplasm of bone: Secondary | ICD-10-CM | POA: Diagnosis not present

## 2021-08-04 DIAGNOSIS — C73 Malignant neoplasm of thyroid gland: Secondary | ICD-10-CM

## 2021-08-04 DIAGNOSIS — C799 Secondary malignant neoplasm of unspecified site: Secondary | ICD-10-CM

## 2021-08-04 DIAGNOSIS — R0609 Other forms of dyspnea: Secondary | ICD-10-CM

## 2021-08-04 DIAGNOSIS — Z9889 Other specified postprocedural states: Secondary | ICD-10-CM | POA: Diagnosis not present

## 2021-08-04 DIAGNOSIS — E782 Mixed hyperlipidemia: Secondary | ICD-10-CM

## 2021-08-04 DIAGNOSIS — R918 Other nonspecific abnormal finding of lung field: Secondary | ICD-10-CM | POA: Diagnosis not present

## 2021-08-04 NOTE — Progress Notes (Signed)
Cardiology Office Note:    Date:  08/04/2021   ID:  Tyrone Washington, DOB 05-09-1955, MRN [id number]  PCP:  [name], NP  Cardiologist:  Jenne Campus, MD    Referring MD: [name], NP   Chief Complaint  Patient presents with   Follow-up    History of Present Illness:    Tyrone Washington is a 66 y.o. male with past medical history significant for metastatic follicular thyroid carcinoma, dyslipidemia who was referred to [place] because of episode of dizziness.  Evaluation so far included echocardiogram which showed basically structurally normal heart as well as monitor which showed no significant arrhythmia.  Comes today to my office for follow-up.  She says most of the prior he is doing well with the situation he will get dizzy that typically happen when he gets up very quickly he does have overall low blood pressure I suspect the etiology of his dizziness is hypotension with some orthostatic hypotension component.  Recently he still having a lot of pain in the right shoulder apparently cancer invaded clavicle he is getting radiation to this area.  Past Medical History:  Diagnosis Date   Allergy    allergic rhinitis   Arthritis    B12 deficiency 04/10/2019   Cancer ([place]) 06/2019   thyroid cancer treatment   History of chicken pox    Hyperlipidemia    no meds now    Past Surgical History:  Procedure Laterality Date   COLONOSCOPY  2005   in [place]-normal exam   LAMINECTOMY N/A 05/13/2019   Procedure: THORACIC TEN LAMINECTOMY FOR TUMOR;  Surgeon: Earnie Larsson, MD;  Location: [facility];  Service: Neurosurgery;  Laterality: N/A;   THYROID SURGERY     "Years ago"    Current Medications: Current Meds  Medication Sig   atorvastatin (LIPITOR) 10 MG tablet Take 1 tablet (10 mg total) by mouth daily.   levothyroxine (SYNTHROID) 137 MCG tablet Take 1 tablet (137 mcg total) by mouth daily at 0600. (Patient taking differently: Take 137 mcg by mouth daily before  breakfast.)   levothyroxine (SYNTHROID) 137 MCG tablet Take 1 tablet (137 mcg total) by mouth Daily at 0600. (Patient taking differently: Take 137 mcg by mouth daily before breakfast.)   Multiple Vitamins-Minerals (CENTRUM SILVER 50+MEN) TABS Take 1 tablet by mouth daily.   traMADol (ULTRAM) 50 MG tablet Take 2 tablets by mouth every 6 hours as needed for pain (Patient taking differently: Take 50 mg by mouth every 6 (six) hours as needed for moderate pain or severe pain.)   valACYclovir (VALTREX) 500 MG tablet Take 1 tablet (500 mg total) by mouth daily. (Patient taking differently: Take 500 mg by mouth daily as needed (outbreaks).)   vitamin B-12 (CYANOCOBALAMIN) 1000 MCG tablet Take 1 tablet (1,000 mcg total) by mouth daily.     Allergies:   Patient has no known allergies.   Social History   Socioeconomic History   Marital status: Single    Spouse name: Not on file   Number of children: 2   Years of education: Not on file   Highest education level: Not on file  Occupational History    Employer: [employer]  Tobacco Use   Smoking status: Every Day    Types: Cigars   Smokeless tobacco: Never   Tobacco comments:    Smokes 1 - 2 cigars daily  Vaping Use   Vaping Use: Never used  Substance and Sexual Activity   Alcohol use: Yes    Alcohol/week:  2.0 standard drinks    Types: 2 Cans of beer per week   Drug use: Never   Sexual activity: Not on file  Other Topics Concern   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     Denies hobbies   No pets.    Social Determinants of Health   Financial Resource Strain: Low Risk    Difficulty of Paying Living Expenses: Not hard at all  Food Insecurity: No Food Insecurity   Worried About Sales executive in the Last Year: Never true   [place] in the Last Year: Never true  Transportation Needs: No Transportation Needs   Lack of Transportation (Medical): No   Lack of Transportation (Non-Medical): No  Physical Activity: Inactive   Days of Exercise per Week: 0 days   Minutes of Exercise per Session: 0 min  Stress: No Stress Concern Present   Feeling of Stress : Not at all  Social Connections: Socially Isolated   Frequency of Communication with Friends and Family: Three times a week   Frequency of Social Gatherings with Friends and Family: Three times a week   Attends Religious Services: Never   Active Member of Clubs or Organizations: No   Attends Archivist Meetings: Never   Marital Status: Never married     Family History: The patient's family history includes CVA in his father; Heart disease in his father. There is no history of Heart attack, Colon cancer, Esophageal cancer, Rectal cancer, or Stomach cancer. ROS:   Please see the history of present illness.    All 14 point review of systems negative except as described per history of present illness  EKGs/Labs/Other Studies Reviewed:      Recent Labs: 12/03/2020: BUN 15; Creatinine, Ser 1.01; Potassium 4.5; Sodium 140 05/19/2021: ALT 11  Recent Lipid Panel    Component Value Date/Time   CHOL 230 (H) 05/19/2021 1039   TRIG 172 (H) 05/19/2021 1039   HDL 45 05/19/2021 1039   CHOLHDL 5.1 (H) 05/19/2021 1039   CHOLHDL 5 04/19/2021 1055   VLDL 25.8 04/19/2021 1055   LDLCALC 154 (H) 05/19/2021 1039    Physical Exam:    VS:  BP 98/60 (BP Location: Left Arm, Patient Position: Sitting)   Pulse 96   Ht 6' (1.829 m)   Wt 160 lb (72.6 kg)   SpO2 98%   BMI 21.70 kg/m     Wt Readings from Last 3 Encounters:  08/04/21 160 lb (72.6 kg)  05/19/21 168 lb (76.2 kg)  05/18/21 163 lb (73.9 kg)     GEN:  Well nourished, well developed in no acute distress HEENT: Normal NECK: No JVD; No carotid  bruits LYMPHATICS: No lymphadenopathy CARDIAC: RRR, no murmurs, no rubs, no gallops RESPIRATORY:  Clear to auscultation without rales, wheezing or rhonchi  ABDOMEN: Soft, non-tender, non-distended MUSCULOSKELETAL:  No edema; No deformity  SKIN: Warm and dry LOWER EXTREMITIES: no swelling NEUROLOGIC:  Alert and oriented x 3 PSYCHIATRIC:  Normal affect   ASSESSMENT:    1. Follicular thyroid carcinoma ([place])   2. Metastasis from thyroid cancer ([place])   3. Mixed hyperlipidemia   4. Dizziness  5. Dyspnea on exertion    PLAN:    In order of problems listed above:  Follicular thyroid carcinoma with metastasis.  Progressing.  He is getting radiotherapy managed excellently by oncology team. Dizziness which I suspect is dehydration and some orthostatic hypotension.  I encouraged him to drink plenty of fluids as well as liberate salt intake.  So far evaluation was unrevealing from cardiac standpoint of view meaning there is no arrhythmia. Mixed dyslipidemia I do have data from March with LDL of 154 and HDL 45 he does not feel well at all he is on Lipitor already we will recheck his fasting lipid profile.   Medication Adjustments/Labs and Tests Ordered: Current medicines are reviewed at length with the patient today.  Concerns regarding medicines are outlined above.  No orders of the defined types were placed in this encounter.  Medication changes: No orders of the defined types were placed in this encounter.   Signed, Park Liter, MD, [facility] 08/04/2021 11:05 AM    [place]

## 2021-08-04 NOTE — Patient Instructions (Signed)

## 2021-08-05 DIAGNOSIS — C7951 Secondary malignant neoplasm of bone: Secondary | ICD-10-CM | POA: Diagnosis not present

## 2021-08-05 DIAGNOSIS — C73 Malignant neoplasm of thyroid gland: Secondary | ICD-10-CM | POA: Diagnosis not present

## 2021-08-05 DIAGNOSIS — E890 Postprocedural hypothyroidism: Secondary | ICD-10-CM | POA: Diagnosis not present

## 2021-08-05 DIAGNOSIS — Z9889 Other specified postprocedural states: Secondary | ICD-10-CM | POA: Diagnosis not present

## 2021-08-05 DIAGNOSIS — R918 Other nonspecific abnormal finding of lung field: Secondary | ICD-10-CM | POA: Diagnosis not present

## 2021-08-05 DIAGNOSIS — Z510 Encounter for antineoplastic radiation therapy: Secondary | ICD-10-CM | POA: Diagnosis not present

## 2021-08-09 DIAGNOSIS — E890 Postprocedural hypothyroidism: Secondary | ICD-10-CM | POA: Diagnosis not present

## 2021-08-09 DIAGNOSIS — Z510 Encounter for antineoplastic radiation therapy: Secondary | ICD-10-CM | POA: Diagnosis not present

## 2021-08-09 DIAGNOSIS — R918 Other nonspecific abnormal finding of lung field: Secondary | ICD-10-CM | POA: Diagnosis not present

## 2021-08-09 DIAGNOSIS — C7951 Secondary malignant neoplasm of bone: Secondary | ICD-10-CM | POA: Diagnosis not present

## 2021-08-09 DIAGNOSIS — C73 Malignant neoplasm of thyroid gland: Secondary | ICD-10-CM | POA: Diagnosis not present

## 2021-08-09 DIAGNOSIS — Z9889 Other specified postprocedural states: Secondary | ICD-10-CM | POA: Diagnosis not present

## 2021-08-10 DIAGNOSIS — Z510 Encounter for antineoplastic radiation therapy: Secondary | ICD-10-CM | POA: Diagnosis not present

## 2021-08-10 DIAGNOSIS — C7951 Secondary malignant neoplasm of bone: Secondary | ICD-10-CM | POA: Diagnosis not present

## 2021-08-10 DIAGNOSIS — C73 Malignant neoplasm of thyroid gland: Secondary | ICD-10-CM | POA: Diagnosis not present

## 2021-08-10 DIAGNOSIS — R918 Other nonspecific abnormal finding of lung field: Secondary | ICD-10-CM | POA: Diagnosis not present

## 2021-08-10 DIAGNOSIS — E890 Postprocedural hypothyroidism: Secondary | ICD-10-CM | POA: Diagnosis not present

## 2021-08-10 DIAGNOSIS — Z9889 Other specified postprocedural states: Secondary | ICD-10-CM | POA: Diagnosis not present

## 2021-08-11 DIAGNOSIS — Z9889 Other specified postprocedural states: Secondary | ICD-10-CM | POA: Diagnosis not present

## 2021-08-11 DIAGNOSIS — C7951 Secondary malignant neoplasm of bone: Secondary | ICD-10-CM | POA: Diagnosis not present

## 2021-08-11 DIAGNOSIS — E890 Postprocedural hypothyroidism: Secondary | ICD-10-CM | POA: Diagnosis not present

## 2021-08-11 DIAGNOSIS — R918 Other nonspecific abnormal finding of lung field: Secondary | ICD-10-CM | POA: Diagnosis not present

## 2021-08-11 DIAGNOSIS — C73 Malignant neoplasm of thyroid gland: Secondary | ICD-10-CM | POA: Diagnosis not present

## 2021-08-11 DIAGNOSIS — Z510 Encounter for antineoplastic radiation therapy: Secondary | ICD-10-CM | POA: Diagnosis not present

## 2021-08-12 DIAGNOSIS — E890 Postprocedural hypothyroidism: Secondary | ICD-10-CM | POA: Diagnosis not present

## 2021-08-12 DIAGNOSIS — Z9889 Other specified postprocedural states: Secondary | ICD-10-CM | POA: Diagnosis not present

## 2021-08-12 DIAGNOSIS — Z510 Encounter for antineoplastic radiation therapy: Secondary | ICD-10-CM | POA: Diagnosis not present

## 2021-08-12 DIAGNOSIS — C73 Malignant neoplasm of thyroid gland: Secondary | ICD-10-CM | POA: Diagnosis not present

## 2021-08-12 DIAGNOSIS — C7951 Secondary malignant neoplasm of bone: Secondary | ICD-10-CM | POA: Diagnosis not present

## 2021-08-12 DIAGNOSIS — R918 Other nonspecific abnormal finding of lung field: Secondary | ICD-10-CM | POA: Diagnosis not present

## 2021-08-19 ENCOUNTER — Other Ambulatory Visit (HOSPITAL_BASED_OUTPATIENT_CLINIC_OR_DEPARTMENT_OTHER): Payer: Self-pay

## 2021-08-21 DIAGNOSIS — R9431 Abnormal electrocardiogram [ECG] [EKG]: Secondary | ICD-10-CM | POA: Diagnosis not present

## 2021-08-21 DIAGNOSIS — E860 Dehydration: Secondary | ICD-10-CM | POA: Diagnosis not present

## 2021-08-21 DIAGNOSIS — R739 Hyperglycemia, unspecified: Secondary | ICD-10-CM | POA: Diagnosis not present

## 2021-08-21 DIAGNOSIS — R55 Syncope and collapse: Secondary | ICD-10-CM | POA: Diagnosis not present

## 2021-08-21 DIAGNOSIS — R404 Transient alteration of awareness: Secondary | ICD-10-CM | POA: Diagnosis not present

## 2021-08-21 DIAGNOSIS — R0602 Shortness of breath: Secondary | ICD-10-CM | POA: Diagnosis not present

## 2021-08-21 DIAGNOSIS — C7989 Secondary malignant neoplasm of other specified sites: Secondary | ICD-10-CM | POA: Diagnosis not present

## 2021-08-21 DIAGNOSIS — C73 Malignant neoplasm of thyroid gland: Secondary | ICD-10-CM | POA: Diagnosis not present

## 2021-08-21 DIAGNOSIS — D649 Anemia, unspecified: Secondary | ICD-10-CM | POA: Diagnosis not present

## 2021-08-21 DIAGNOSIS — R0902 Hypoxemia: Secondary | ICD-10-CM | POA: Diagnosis not present

## 2021-08-21 DIAGNOSIS — W01198A Fall on same level from slipping, tripping and stumbling with subsequent striking against other object, initial encounter: Secondary | ICD-10-CM | POA: Diagnosis not present

## 2021-08-21 DIAGNOSIS — Z743 Need for continuous supervision: Secondary | ICD-10-CM | POA: Diagnosis not present

## 2021-08-21 DIAGNOSIS — R4020 Unspecified coma: Secondary | ICD-10-CM | POA: Diagnosis not present

## 2021-08-21 DIAGNOSIS — R42 Dizziness and giddiness: Secondary | ICD-10-CM | POA: Diagnosis not present

## 2021-08-21 DIAGNOSIS — E871 Hypo-osmolality and hyponatremia: Secondary | ICD-10-CM | POA: Diagnosis not present

## 2021-08-21 DIAGNOSIS — S22080A Wedge compression fracture of T11-T12 vertebra, initial encounter for closed fracture: Secondary | ICD-10-CM | POA: Diagnosis not present

## 2021-08-21 DIAGNOSIS — Y999 Unspecified external cause status: Secondary | ICD-10-CM | POA: Diagnosis not present

## 2021-08-22 DIAGNOSIS — C73 Malignant neoplasm of thyroid gland: Secondary | ICD-10-CM | POA: Diagnosis not present

## 2021-08-22 DIAGNOSIS — S22080A Wedge compression fracture of T11-T12 vertebra, initial encounter for closed fracture: Secondary | ICD-10-CM | POA: Diagnosis not present

## 2021-08-22 DIAGNOSIS — R000 Tachycardia, unspecified: Secondary | ICD-10-CM | POA: Diagnosis not present

## 2021-08-22 DIAGNOSIS — R55 Syncope and collapse: Secondary | ICD-10-CM | POA: Diagnosis not present

## 2021-08-22 DIAGNOSIS — W01198A Fall on same level from slipping, tripping and stumbling with subsequent striking against other object, initial encounter: Secondary | ICD-10-CM | POA: Diagnosis not present

## 2021-08-22 DIAGNOSIS — D649 Anemia, unspecified: Secondary | ICD-10-CM | POA: Diagnosis not present

## 2021-08-22 DIAGNOSIS — Y999 Unspecified external cause status: Secondary | ICD-10-CM | POA: Diagnosis not present

## 2021-08-22 DIAGNOSIS — E860 Dehydration: Secondary | ICD-10-CM | POA: Diagnosis not present

## 2021-08-22 DIAGNOSIS — R079 Chest pain, unspecified: Secondary | ICD-10-CM | POA: Diagnosis not present

## 2021-08-22 DIAGNOSIS — E871 Hypo-osmolality and hyponatremia: Secondary | ICD-10-CM | POA: Diagnosis not present

## 2021-09-02 DIAGNOSIS — J9691 Respiratory failure, unspecified with hypoxia: Secondary | ICD-10-CM | POA: Diagnosis not present

## 2021-09-02 DIAGNOSIS — Z20822 Contact with and (suspected) exposure to covid-19: Secondary | ICD-10-CM | POA: Diagnosis not present

## 2021-09-02 DIAGNOSIS — Z9089 Acquired absence of other organs: Secondary | ICD-10-CM | POA: Diagnosis not present

## 2021-09-02 DIAGNOSIS — R042 Hemoptysis: Secondary | ICD-10-CM | POA: Diagnosis not present

## 2021-09-02 DIAGNOSIS — R0600 Dyspnea, unspecified: Secondary | ICD-10-CM | POA: Diagnosis not present

## 2021-09-02 DIAGNOSIS — E785 Hyperlipidemia, unspecified: Secondary | ICD-10-CM | POA: Diagnosis not present

## 2021-09-02 DIAGNOSIS — E860 Dehydration: Secondary | ICD-10-CM | POA: Diagnosis not present

## 2021-09-02 DIAGNOSIS — E7849 Other hyperlipidemia: Secondary | ICD-10-CM | POA: Diagnosis not present

## 2021-09-02 DIAGNOSIS — I959 Hypotension, unspecified: Secondary | ICD-10-CM | POA: Diagnosis not present

## 2021-09-02 DIAGNOSIS — E871 Hypo-osmolality and hyponatremia: Secondary | ICD-10-CM | POA: Diagnosis not present

## 2021-09-02 DIAGNOSIS — R6520 Severe sepsis without septic shock: Secondary | ICD-10-CM | POA: Diagnosis not present

## 2021-09-02 DIAGNOSIS — G8929 Other chronic pain: Secondary | ICD-10-CM | POA: Diagnosis not present

## 2021-09-02 DIAGNOSIS — A419 Sepsis, unspecified organism: Secondary | ICD-10-CM | POA: Diagnosis not present

## 2021-09-02 DIAGNOSIS — C7802 Secondary malignant neoplasm of left lung: Secondary | ICD-10-CM | POA: Diagnosis not present

## 2021-09-02 DIAGNOSIS — E890 Postprocedural hypothyroidism: Secondary | ICD-10-CM | POA: Diagnosis not present

## 2021-09-02 DIAGNOSIS — R0902 Hypoxemia: Secondary | ICD-10-CM | POA: Diagnosis not present

## 2021-09-02 DIAGNOSIS — I499 Cardiac arrhythmia, unspecified: Secondary | ICD-10-CM | POA: Diagnosis not present

## 2021-09-02 DIAGNOSIS — G893 Neoplasm related pain (acute) (chronic): Secondary | ICD-10-CM | POA: Diagnosis not present

## 2021-09-02 DIAGNOSIS — R000 Tachycardia, unspecified: Secondary | ICD-10-CM | POA: Diagnosis not present

## 2021-09-02 DIAGNOSIS — C799 Secondary malignant neoplasm of unspecified site: Secondary | ICD-10-CM | POA: Diagnosis not present

## 2021-09-02 DIAGNOSIS — Z66 Do not resuscitate: Secondary | ICD-10-CM | POA: Diagnosis not present

## 2021-09-02 DIAGNOSIS — Z515 Encounter for palliative care: Secondary | ICD-10-CM | POA: Diagnosis not present

## 2021-09-02 DIAGNOSIS — R531 Weakness: Secondary | ICD-10-CM | POA: Diagnosis not present

## 2021-09-02 DIAGNOSIS — Z923 Personal history of irradiation: Secondary | ICD-10-CM | POA: Diagnosis not present

## 2021-09-02 DIAGNOSIS — Z9221 Personal history of antineoplastic chemotherapy: Secondary | ICD-10-CM | POA: Diagnosis not present

## 2021-09-02 DIAGNOSIS — C7951 Secondary malignant neoplasm of bone: Secondary | ICD-10-CM | POA: Diagnosis not present

## 2021-09-02 DIAGNOSIS — J9601 Acute respiratory failure with hypoxia: Secondary | ICD-10-CM | POA: Diagnosis not present

## 2021-09-02 DIAGNOSIS — C73 Malignant neoplasm of thyroid gland: Secondary | ICD-10-CM | POA: Diagnosis not present

## 2021-09-02 DIAGNOSIS — F17290 Nicotine dependence, other tobacco product, uncomplicated: Secondary | ICD-10-CM | POA: Diagnosis not present

## 2021-09-02 DIAGNOSIS — C7800 Secondary malignant neoplasm of unspecified lung: Secondary | ICD-10-CM | POA: Diagnosis not present

## 2021-09-02 DIAGNOSIS — R638 Other symptoms and signs concerning food and fluid intake: Secondary | ICD-10-CM | POA: Diagnosis not present

## 2021-09-02 DIAGNOSIS — R911 Solitary pulmonary nodule: Secondary | ICD-10-CM | POA: Diagnosis not present

## 2021-09-02 DIAGNOSIS — E039 Hypothyroidism, unspecified: Secondary | ICD-10-CM | POA: Diagnosis not present

## 2021-09-02 DIAGNOSIS — R0609 Other forms of dyspnea: Secondary | ICD-10-CM | POA: Diagnosis not present

## 2021-09-02 DIAGNOSIS — R0602 Shortness of breath: Secondary | ICD-10-CM | POA: Diagnosis not present

## 2021-09-02 DIAGNOSIS — R918 Other nonspecific abnormal finding of lung field: Secondary | ICD-10-CM | POA: Diagnosis not present

## 2021-09-02 DIAGNOSIS — C7801 Secondary malignant neoplasm of right lung: Secondary | ICD-10-CM | POA: Diagnosis not present

## 2021-09-02 DIAGNOSIS — E875 Hyperkalemia: Secondary | ICD-10-CM | POA: Diagnosis not present

## 2021-09-02 DIAGNOSIS — A411 Sepsis due to other specified staphylococcus: Secondary | ICD-10-CM | POA: Diagnosis not present

## 2021-09-02 DIAGNOSIS — Z743 Need for continuous supervision: Secondary | ICD-10-CM | POA: Diagnosis not present

## 2021-09-02 DIAGNOSIS — Z87891 Personal history of nicotine dependence: Secondary | ICD-10-CM | POA: Diagnosis not present

## 2021-09-03 DIAGNOSIS — C7802 Secondary malignant neoplasm of left lung: Secondary | ICD-10-CM | POA: Diagnosis not present

## 2021-09-03 DIAGNOSIS — R0602 Shortness of breath: Secondary | ICD-10-CM | POA: Diagnosis not present

## 2021-09-03 DIAGNOSIS — C7951 Secondary malignant neoplasm of bone: Secondary | ICD-10-CM | POA: Diagnosis not present

## 2021-09-03 DIAGNOSIS — E890 Postprocedural hypothyroidism: Secondary | ICD-10-CM | POA: Diagnosis not present

## 2021-09-03 DIAGNOSIS — E785 Hyperlipidemia, unspecified: Secondary | ICD-10-CM | POA: Diagnosis not present

## 2021-09-03 DIAGNOSIS — R918 Other nonspecific abnormal finding of lung field: Secondary | ICD-10-CM | POA: Diagnosis not present

## 2021-09-03 DIAGNOSIS — R911 Solitary pulmonary nodule: Secondary | ICD-10-CM | POA: Diagnosis not present

## 2021-09-03 DIAGNOSIS — A419 Sepsis, unspecified organism: Secondary | ICD-10-CM | POA: Diagnosis not present

## 2021-09-03 DIAGNOSIS — C73 Malignant neoplasm of thyroid gland: Secondary | ICD-10-CM | POA: Diagnosis not present

## 2021-09-03 DIAGNOSIS — E871 Hypo-osmolality and hyponatremia: Secondary | ICD-10-CM | POA: Diagnosis not present

## 2021-09-03 DIAGNOSIS — C7801 Secondary malignant neoplasm of right lung: Secondary | ICD-10-CM | POA: Diagnosis not present

## 2021-09-03 DIAGNOSIS — R000 Tachycardia, unspecified: Secondary | ICD-10-CM | POA: Diagnosis not present

## 2021-09-03 DIAGNOSIS — J9601 Acute respiratory failure with hypoxia: Secondary | ICD-10-CM | POA: Diagnosis not present

## 2021-09-04 DIAGNOSIS — C7802 Secondary malignant neoplasm of left lung: Secondary | ICD-10-CM | POA: Diagnosis not present

## 2021-09-04 DIAGNOSIS — A419 Sepsis, unspecified organism: Secondary | ICD-10-CM | POA: Diagnosis not present

## 2021-09-04 DIAGNOSIS — J9601 Acute respiratory failure with hypoxia: Secondary | ICD-10-CM | POA: Diagnosis not present

## 2021-09-04 DIAGNOSIS — E871 Hypo-osmolality and hyponatremia: Secondary | ICD-10-CM | POA: Diagnosis not present

## 2021-09-04 DIAGNOSIS — R918 Other nonspecific abnormal finding of lung field: Secondary | ICD-10-CM | POA: Diagnosis not present

## 2021-09-04 DIAGNOSIS — C7951 Secondary malignant neoplasm of bone: Secondary | ICD-10-CM | POA: Diagnosis not present

## 2021-09-04 DIAGNOSIS — C73 Malignant neoplasm of thyroid gland: Secondary | ICD-10-CM | POA: Diagnosis not present

## 2021-09-04 DIAGNOSIS — E890 Postprocedural hypothyroidism: Secondary | ICD-10-CM | POA: Diagnosis not present

## 2021-09-04 DIAGNOSIS — E785 Hyperlipidemia, unspecified: Secondary | ICD-10-CM | POA: Diagnosis not present

## 2021-09-04 DIAGNOSIS — C7801 Secondary malignant neoplasm of right lung: Secondary | ICD-10-CM | POA: Diagnosis not present

## 2021-09-05 DIAGNOSIS — R0902 Hypoxemia: Secondary | ICD-10-CM | POA: Diagnosis not present

## 2021-09-05 DIAGNOSIS — Z87891 Personal history of nicotine dependence: Secondary | ICD-10-CM | POA: Diagnosis not present

## 2021-09-05 DIAGNOSIS — E039 Hypothyroidism, unspecified: Secondary | ICD-10-CM | POA: Diagnosis not present

## 2021-09-05 DIAGNOSIS — E871 Hypo-osmolality and hyponatremia: Secondary | ICD-10-CM | POA: Diagnosis not present

## 2021-09-05 DIAGNOSIS — C7951 Secondary malignant neoplasm of bone: Secondary | ICD-10-CM | POA: Diagnosis not present

## 2021-09-05 DIAGNOSIS — J9601 Acute respiratory failure with hypoxia: Secondary | ICD-10-CM | POA: Diagnosis not present

## 2021-09-05 DIAGNOSIS — R6520 Severe sepsis without septic shock: Secondary | ICD-10-CM | POA: Diagnosis not present

## 2021-09-05 DIAGNOSIS — Z515 Encounter for palliative care: Secondary | ICD-10-CM | POA: Diagnosis not present

## 2021-09-05 DIAGNOSIS — R0609 Other forms of dyspnea: Secondary | ICD-10-CM | POA: Diagnosis not present

## 2021-09-05 DIAGNOSIS — C7800 Secondary malignant neoplasm of unspecified lung: Secondary | ICD-10-CM | POA: Diagnosis not present

## 2021-09-05 DIAGNOSIS — G8929 Other chronic pain: Secondary | ICD-10-CM | POA: Diagnosis not present

## 2021-09-05 DIAGNOSIS — R000 Tachycardia, unspecified: Secondary | ICD-10-CM | POA: Diagnosis not present

## 2021-09-05 DIAGNOSIS — J9691 Respiratory failure, unspecified with hypoxia: Secondary | ICD-10-CM | POA: Diagnosis not present

## 2021-09-05 DIAGNOSIS — Z9089 Acquired absence of other organs: Secondary | ICD-10-CM | POA: Diagnosis not present

## 2021-09-05 DIAGNOSIS — C73 Malignant neoplasm of thyroid gland: Secondary | ICD-10-CM | POA: Diagnosis not present

## 2021-09-05 DIAGNOSIS — A411 Sepsis due to other specified staphylococcus: Secondary | ICD-10-CM | POA: Diagnosis not present

## 2021-09-06 DIAGNOSIS — C7951 Secondary malignant neoplasm of bone: Secondary | ICD-10-CM | POA: Diagnosis not present

## 2021-09-06 DIAGNOSIS — G893 Neoplasm related pain (acute) (chronic): Secondary | ICD-10-CM | POA: Diagnosis not present

## 2021-09-06 DIAGNOSIS — C73 Malignant neoplasm of thyroid gland: Secondary | ICD-10-CM | POA: Diagnosis not present

## 2021-09-06 DIAGNOSIS — A411 Sepsis due to other specified staphylococcus: Secondary | ICD-10-CM | POA: Diagnosis not present

## 2021-09-06 DIAGNOSIS — G8929 Other chronic pain: Secondary | ICD-10-CM | POA: Diagnosis not present

## 2021-09-06 DIAGNOSIS — R6520 Severe sepsis without septic shock: Secondary | ICD-10-CM | POA: Diagnosis not present

## 2021-09-06 DIAGNOSIS — C7800 Secondary malignant neoplasm of unspecified lung: Secondary | ICD-10-CM | POA: Diagnosis not present

## 2021-09-06 DIAGNOSIS — Z515 Encounter for palliative care: Secondary | ICD-10-CM | POA: Diagnosis not present

## 2021-09-06 DIAGNOSIS — E039 Hypothyroidism, unspecified: Secondary | ICD-10-CM | POA: Diagnosis not present

## 2021-09-06 DIAGNOSIS — J9601 Acute respiratory failure with hypoxia: Secondary | ICD-10-CM | POA: Diagnosis not present

## 2021-09-06 DIAGNOSIS — R000 Tachycardia, unspecified: Secondary | ICD-10-CM | POA: Diagnosis not present

## 2021-09-06 DIAGNOSIS — R638 Other symptoms and signs concerning food and fluid intake: Secondary | ICD-10-CM | POA: Diagnosis not present

## 2021-09-06 DIAGNOSIS — E871 Hypo-osmolality and hyponatremia: Secondary | ICD-10-CM | POA: Diagnosis not present

## 2021-09-10 DEATH — deceased

## 2021-12-01 ENCOUNTER — Ambulatory Visit: Payer: Medicare Other
